# Patient Record
Sex: Female | Born: 1950 | Race: Black or African American | Hispanic: No | Marital: Single | Smoking: Never smoker
Health system: Southern US, Community
[De-identification: ages and names within clinical notes are randomized; demographics above are authoritative.]

## PROBLEM LIST (undated history)

## (undated) DIAGNOSIS — E785 Hyperlipidemia, unspecified: Secondary | ICD-10-CM

## (undated) DIAGNOSIS — I2510 Atherosclerotic heart disease of native coronary artery without angina pectoris: Secondary | ICD-10-CM

## (undated) DIAGNOSIS — Z972 Presence of dental prosthetic device (complete) (partial): Secondary | ICD-10-CM

## (undated) DIAGNOSIS — K219 Gastro-esophageal reflux disease without esophagitis: Secondary | ICD-10-CM

## (undated) DIAGNOSIS — K08109 Complete loss of teeth, unspecified cause, unspecified class: Secondary | ICD-10-CM

## (undated) DIAGNOSIS — I209 Angina pectoris, unspecified: Secondary | ICD-10-CM

## (undated) DIAGNOSIS — F419 Anxiety disorder, unspecified: Secondary | ICD-10-CM

## (undated) DIAGNOSIS — F329 Major depressive disorder, single episode, unspecified: Secondary | ICD-10-CM

## (undated) DIAGNOSIS — F32A Depression, unspecified: Secondary | ICD-10-CM

## (undated) DIAGNOSIS — I10 Essential (primary) hypertension: Secondary | ICD-10-CM

## (undated) HISTORY — DX: Essential (primary) hypertension: I10

## (undated) HISTORY — DX: Depression, unspecified: F32.A

## (undated) HISTORY — PX: TOOTH EXTRACTION: SUR596

## (undated) HISTORY — PX: BREAST SURGERY: SHX581

## (undated) HISTORY — DX: Major depressive disorder, single episode, unspecified: F32.9

## (undated) HISTORY — PX: TUBAL LIGATION: SHX77

## (undated) HISTORY — PX: COLONOSCOPY: SHX5424

## (undated) HISTORY — DX: Anxiety disorder, unspecified: F41.9

## (undated) HISTORY — PX: BREAST EXCISIONAL BIOPSY: SUR124

## (undated) HISTORY — DX: Hyperlipidemia, unspecified: E78.5

## (undated) HISTORY — DX: Gastro-esophageal reflux disease without esophagitis: K21.9

## (undated) HISTORY — PX: TOE OSTEOTOMY: SHX1071

---

## 2000-01-21 ENCOUNTER — Encounter: Admission: RE | Admit: 2000-01-21 | Discharge: 2000-01-21 | Payer: Self-pay | Admitting: Family Medicine

## 2000-01-21 ENCOUNTER — Encounter: Payer: Self-pay | Admitting: Family Medicine

## 2000-02-10 ENCOUNTER — Emergency Department (HOSPITAL_COMMUNITY): Admission: EM | Admit: 2000-02-10 | Discharge: 2000-02-10 | Payer: Self-pay | Admitting: Emergency Medicine

## 2000-02-10 ENCOUNTER — Encounter: Payer: Self-pay | Admitting: Emergency Medicine

## 2001-01-26 ENCOUNTER — Encounter: Admission: RE | Admit: 2001-01-26 | Discharge: 2001-01-26 | Payer: Self-pay | Admitting: Family Medicine

## 2001-01-26 ENCOUNTER — Encounter: Payer: Self-pay | Admitting: Family Medicine

## 2002-01-28 ENCOUNTER — Other Ambulatory Visit: Admission: RE | Admit: 2002-01-28 | Discharge: 2002-01-28 | Payer: Self-pay | Admitting: Internal Medicine

## 2002-05-24 ENCOUNTER — Encounter: Admission: RE | Admit: 2002-05-24 | Discharge: 2002-05-24 | Payer: Self-pay | Admitting: Family Medicine

## 2002-05-24 ENCOUNTER — Encounter: Payer: Self-pay | Admitting: Family Medicine

## 2003-12-20 ENCOUNTER — Encounter: Admission: RE | Admit: 2003-12-20 | Discharge: 2003-12-20 | Payer: Self-pay | Admitting: Family Medicine

## 2006-07-01 ENCOUNTER — Other Ambulatory Visit: Admission: RE | Admit: 2006-07-01 | Discharge: 2006-07-01 | Payer: Self-pay | Admitting: Family Medicine

## 2006-07-10 ENCOUNTER — Encounter: Admission: RE | Admit: 2006-07-10 | Discharge: 2006-07-10 | Payer: Self-pay | Admitting: Family Medicine

## 2006-09-09 ENCOUNTER — Encounter: Admission: RE | Admit: 2006-09-09 | Discharge: 2006-09-09 | Payer: Self-pay | Admitting: Family Medicine

## 2007-12-20 ENCOUNTER — Emergency Department (HOSPITAL_COMMUNITY): Admission: EM | Admit: 2007-12-20 | Discharge: 2007-12-21 | Payer: Self-pay | Admitting: Emergency Medicine

## 2009-08-30 ENCOUNTER — Observation Stay (HOSPITAL_COMMUNITY): Admission: EM | Admit: 2009-08-30 | Discharge: 2009-08-31 | Payer: Self-pay | Admitting: Emergency Medicine

## 2010-11-09 LAB — CARDIAC PANEL(CRET KIN+CKTOT+MB+TROPI)
CK, MB: 1.9 ng/mL (ref 0.3–4.0)
CK, MB: 2.1 ng/mL (ref 0.3–4.0)
Relative Index: 0.6 (ref 0.0–2.5)
Relative Index: 0.9 (ref 0.0–2.5)
Total CK: 205 U/L — ABNORMAL HIGH (ref 7–177)
Troponin I: 0.01 ng/mL (ref 0.00–0.06)
Troponin I: 0.01 ng/mL (ref 0.00–0.06)
Troponin I: 0.02 ng/mL (ref 0.00–0.06)

## 2010-11-09 LAB — URINALYSIS, ROUTINE W REFLEX MICROSCOPIC
Glucose, UA: NEGATIVE mg/dL
Hgb urine dipstick: NEGATIVE
Ketones, ur: NEGATIVE mg/dL
Nitrite: NEGATIVE
Protein, ur: NEGATIVE mg/dL
Urobilinogen, UA: 0.2 mg/dL (ref 0.0–1.0)
pH: 8 (ref 5.0–8.0)

## 2010-11-09 LAB — POCT CARDIAC MARKERS
CKMB, poc: 1.8 ng/mL (ref 1.0–8.0)
Myoglobin, poc: 85.9 ng/mL (ref 12–200)
Troponin i, poc: <0.05 ng/mL (ref 0.00–0.09)

## 2010-11-09 LAB — URINE DRUGS OF ABUSE SCREEN W ALC, ROUTINE (REF LAB)
Amphetamine Screen, Ur: NEGATIVE
Benzodiazepines.: NEGATIVE
Creatinine,U: 233.9 mg/dL
Ethyl Alcohol: <10 mg/dL (ref <10)
Opiate Screen, Urine: POSITIVE — AB

## 2010-11-09 LAB — CBC
Hemoglobin: 12.2 g/dL (ref 12.0–15.0)
MCHC: 35.5 g/dL (ref 30.0–36.0)
MCHC: 35.6 g/dL (ref 30.0–36.0)
MCV: 93.7 fL (ref 78.0–100.0)
Platelets: 263 10*3/uL (ref 150–400)
RDW: 12.8 % (ref 11.5–15.5)
WBC: 10.3 10*3/uL (ref 4.0–10.5)

## 2010-11-09 LAB — COMPREHENSIVE METABOLIC PANEL
Alkaline Phosphatase: 60 U/L (ref 39–117)
Chloride: 102 mEq/L (ref 96–112)
GFR calc Af Amer: >60 mL/min (ref >60)
Potassium: 3.8 mEq/L (ref 3.5–5.1)
Total Bilirubin: 0.7 mg/dL (ref 0.3–1.2)
Total Protein: 7 g/dL (ref 6.0–8.3)

## 2010-11-09 LAB — DIFFERENTIAL
Basophils Absolute: 0.1 10*3/uL (ref 0.0–0.1)
Basophils Relative: 1 % (ref 0–1)
Eosinophils Absolute: 0.1 10*3/uL (ref 0.0–0.7)
Lymphocytes Relative: 6 % — ABNORMAL LOW (ref 12–46)
Neutrophils Relative %: 84 % — ABNORMAL HIGH (ref 43–77)

## 2010-11-09 LAB — MAGNESIUM: Magnesium: 1.8 mg/dL (ref 1.5–2.5)

## 2010-11-09 LAB — POCT I-STAT, CHEM 8
Chloride: 105 mEq/L (ref 96–112)
Creatinine, Ser: 1 mg/dL (ref 0.4–1.2)
Hemoglobin: 12.2 g/dL (ref 12.0–15.0)
TCO2: 25 mmol/L (ref 0–100)

## 2010-11-09 LAB — OPIATE, QUANTITATIVE, URINE
Hydrocodone: 3922 NG/ML — ABNORMAL HIGH
Oxycodone, ur: NEGATIVE NG/ML
Oxymorphone: NEGATIVE NG/ML

## 2010-11-09 LAB — TSH: TSH: 0.545 u[IU]/mL (ref 0.350–4.500)

## 2010-11-09 LAB — LIPID PANEL
HDL: 76 mg/dL (ref >39)
LDL Cholesterol: 110 mg/dL — ABNORMAL HIGH (ref 0–99)
Triglycerides: 24 mg/dL (ref <150)

## 2013-04-27 DIAGNOSIS — L600 Ingrowing nail: Secondary | ICD-10-CM | POA: Insufficient documentation

## 2013-05-10 ENCOUNTER — Encounter: Payer: Self-pay | Admitting: *Deleted

## 2013-05-10 DIAGNOSIS — L600 Ingrowing nail: Secondary | ICD-10-CM

## 2013-05-25 ENCOUNTER — Ambulatory Visit: Payer: Self-pay | Admitting: Podiatry

## 2013-06-15 ENCOUNTER — Encounter: Payer: Self-pay | Admitting: Podiatry

## 2013-06-15 ENCOUNTER — Ambulatory Visit (INDEPENDENT_AMBULATORY_CARE_PROVIDER_SITE_OTHER): Payer: BC Managed Care – PPO | Admitting: Podiatry

## 2013-06-15 VITALS — BP 155/82 | HR 58 | Resp 16 | Ht 60.0 in | Wt 196.0 lb

## 2013-06-15 DIAGNOSIS — M2020 Hallux rigidus, unspecified foot: Secondary | ICD-10-CM

## 2013-06-15 DIAGNOSIS — M2021 Hallux rigidus, right foot: Secondary | ICD-10-CM

## 2013-06-15 DIAGNOSIS — L600 Ingrowing nail: Secondary | ICD-10-CM

## 2013-06-15 NOTE — Progress Notes (Signed)
Subjective:     Patient ID: Tonya Bryant, female   DOB: October 22, 1950, 62 y.o.   MRN: [id number]  HPI patient states my nails are scabbed over and I also had questions concerning my bunions   Review of Systems  All other systems reviewed and are negative.       Objective:   Physical Exam  Nursing note and vitals reviewed. Cardiovascular: Intact distal pulses.   Musculoskeletal: Normal range of motion.  Neurological: She is alert.  Skin: Skin is warm.   patient's hallux nails bilateral the medial side there crusted over no drainage noted. Right first MPJ is mildly enlarged with limitation of motion of the joint     Assessment:     Improving ingrown toenail surgery bilateral normal recovery. 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 He should want surgery but wants to hold off continue Band-Aids at the current time

## 2013-06-21 ENCOUNTER — Other Ambulatory Visit: Payer: Self-pay | Admitting: Orthopedic Surgery

## 2013-07-05 ENCOUNTER — Encounter (HOSPITAL_BASED_OUTPATIENT_CLINIC_OR_DEPARTMENT_OTHER): Payer: Self-pay | Admitting: *Deleted

## 2013-07-05 NOTE — Progress Notes (Signed)
Said she had labs and ekg-will call-took one mor ehtn med-to bring [employer]

## 2013-07-06 NOTE — H&P (Signed)
Tonya Bryant is an 62 y.o. female.   Chief Complaint:  c/o chronic and progressive numbness and tingling of the left hand and STS symptoms left thumb HPI: Tonya Bryant presented for evaluation of new symptoms of triggering of her left thumb, tenderness at her left first dorsal compartment, and bilateral hand numbness, left worse than right. I have known Ms. Bryant for many years. She has had prior trigger fingers. She reports that she has had nocturnal numbness and morning numbness in her left hand for months.   Past Medical History  Diagnosis Date  . Hypertension   . Anxiety   . Depression (emotion)   . Full dentures     Past Surgical History  Procedure Laterality Date  . Breast surgery      lt br bx-neg  . Toe osteotomy      rt big toe  . Tubal ligation    . Colonoscopy    . Tooth extraction      Family History  Problem Relation Age of Onset  . Cancer Mother   . Diabetes Mother   . Stroke Mother   . Hypertension Mother   . Stroke Father   . Diabetes Father   . Hypertension Father   . Gout Father    Social History:  reports that she has never smoked. She does not have any smokeless tobacco history on file. She reports that she drinks about 0.5 ounces of alcohol per week. She reports that she does not use illicit drugs.  Allergies: No Known Allergies  No prescriptions prior to admission    No results found for this or any previous visit (from the past 48 hour(s)).  No results found.   Pertinent items are noted in HPI.  There were no vitals taken for this visit.  General appearance: alert Head: Normocephalic, without obvious abnormality Neck: supple, symmetrical, trachea midline Resp: clear to auscultation bilaterally Cardio: regular rate and rhythm GI: normal findings: bowel sounds normal Extremities:  She has active stenosing tenosynovitis of her left thumb at the A-1 pulley, tenderness and locking of the IP joint in flexion. She has tenderness over the  left first dorsal compartment and a positive Finkelstein's maneuver. She has diminished sensibility in the median distribution bilaterally. There is no sign of thenar atrophy. She has full ROM of her fingers on the left, fingers on the right, and thumb on the right. She has no sign of right first dorsal compartment stenosing tenosynovitis.   X-rays of her cervical spine were obtained due to complaints of stiffness and referred pain to the periscapular region. Ms. Bryant is noted to have significant degenerative disc disease at C4/5, C5/6, and C6/7 with loss of her normal cervical lordosis. She has some posterior projecting osteophytes.  We asked Dr. Johna Roles to perform screening electrodiagnostic studies. These revealed profound left carpal tunnel syndrome and moderate right carpal tunnel syndrome. Her ulnar nerve conduction parameters were normal.   Pulses: 2+ and symmetric Skin: mobility and turgor normal Neurologic: Grossly normal    Assessment/Plan Impression: Left CTS and STS left thumb.  Plan: To the OR for left CTR and release left thumb A-1 pulley.The procedure, risks,benefits and post-op course were discussed with the patient at length and they were in agreement with the plan. DASNOIT,Audiel Scheiber J  07/06/2013, 4:18 PM  H&P documentation: 07/07/2013  -History and Physical Reviewed  -Patient has been re-examined  -No change in the plan of care  Wyn Forster, MD

## 2013-07-07 ENCOUNTER — Encounter (HOSPITAL_BASED_OUTPATIENT_CLINIC_OR_DEPARTMENT_OTHER): Payer: Self-pay | Admitting: *Deleted

## 2013-07-07 ENCOUNTER — Ambulatory Visit (HOSPITAL_BASED_OUTPATIENT_CLINIC_OR_DEPARTMENT_OTHER): Payer: BC Managed Care – PPO | Admitting: Anesthesiology

## 2013-07-07 ENCOUNTER — Encounter (HOSPITAL_BASED_OUTPATIENT_CLINIC_OR_DEPARTMENT_OTHER): Payer: BC Managed Care – PPO | Admitting: Anesthesiology

## 2013-07-07 ENCOUNTER — Ambulatory Visit (HOSPITAL_BASED_OUTPATIENT_CLINIC_OR_DEPARTMENT_OTHER)
Admission: RE | Admit: 2013-07-07 | Discharge: 2013-07-07 | Disposition: A | Discharge status: Home / Self Care | Payer: BC Managed Care – PPO | Source: Ambulatory Visit | Attending: Orthopedic Surgery | Admitting: Orthopedic Surgery

## 2013-07-07 ENCOUNTER — Encounter (HOSPITAL_BASED_OUTPATIENT_CLINIC_OR_DEPARTMENT_OTHER)
Admission: RE | Disposition: A | Discharge status: Home / Self Care | Payer: Self-pay | Source: Ambulatory Visit | Attending: Orthopedic Surgery

## 2013-07-07 DIAGNOSIS — F3289 Other specified depressive episodes: Secondary | ICD-10-CM | POA: Insufficient documentation

## 2013-07-07 DIAGNOSIS — M5030 Other cervical disc degeneration, unspecified cervical region: Secondary | ICD-10-CM | POA: Insufficient documentation

## 2013-07-07 DIAGNOSIS — F411 Generalized anxiety disorder: Secondary | ICD-10-CM | POA: Insufficient documentation

## 2013-07-07 DIAGNOSIS — M65839 Other synovitis and tenosynovitis, unspecified forearm: Secondary | ICD-10-CM | POA: Insufficient documentation

## 2013-07-07 DIAGNOSIS — F329 Major depressive disorder, single episode, unspecified: Secondary | ICD-10-CM | POA: Insufficient documentation

## 2013-07-07 DIAGNOSIS — I10 Essential (primary) hypertension: Secondary | ICD-10-CM | POA: Insufficient documentation

## 2013-07-07 DIAGNOSIS — M6530 Trigger finger, unspecified finger: Secondary | ICD-10-CM | POA: Insufficient documentation

## 2013-07-07 DIAGNOSIS — G5600 Carpal tunnel syndrome, unspecified upper limb: Secondary | ICD-10-CM | POA: Insufficient documentation

## 2013-07-07 HISTORY — PX: CARPAL TUNNEL RELEASE: SHX101

## 2013-07-07 HISTORY — PX: TRIGGER FINGER RELEASE: SHX641

## 2013-07-07 HISTORY — DX: Complete loss of teeth, unspecified cause, unspecified class: Z97.2

## 2013-07-07 HISTORY — DX: Complete loss of teeth, unspecified cause, unspecified class: K08.109

## 2013-07-07 LAB — POCT HEMOGLOBIN-HEMACUE: Hemoglobin: 13 g/dL (ref 12.0–15.0)

## 2013-07-07 SURGERY — CARPAL TUNNEL RELEASE
Anesthesia: General | Site: Wrist | Laterality: Left | Wound class: Clean

## 2013-07-07 MED ORDER — FENTANYL CITRATE 0.05 MG/ML IJ SOLN: 50.0000 ug | INTRAMUSCULAR | Status: DC | PRN

## 2013-07-07 MED ORDER — FENTANYL CITRATE 0.05 MG/ML IJ SOLN
INTRAMUSCULAR | Status: DC | PRN
  Administered 2013-07-07: 25 ug via INTRAVENOUS

## 2013-07-07 MED ORDER — ONDANSETRON HCL 4 MG/2ML IJ SOLN
INTRAMUSCULAR | Status: DC | PRN
  Administered 2013-07-07: 4 mg via INTRAVENOUS

## 2013-07-07 MED ORDER — OXYCODONE-ACETAMINOPHEN 5-325 MG PO TABS: ORAL_TABLET | ORAL | Status: DC

## 2013-07-07 MED ORDER — LIDOCAINE HCL (CARDIAC) 20 MG/ML IV SOLN
INTRAVENOUS | Status: DC | PRN
  Administered 2013-07-07: 100 mg via INTRAVENOUS

## 2013-07-07 MED ORDER — MIDAZOLAM HCL 2 MG/2ML IJ SOLN
INTRAMUSCULAR | Status: AC
  Filled 2013-07-07: qty 2

## 2013-07-07 MED ORDER — DEXAMETHASONE SODIUM PHOSPHATE 4 MG/ML IJ SOLN
INTRAMUSCULAR | Status: DC | PRN
  Administered 2013-07-07: 10 mg via INTRAVENOUS

## 2013-07-07 MED ORDER — HYDROMORPHONE HCL PF 1 MG/ML IJ SOLN: 0.2500 mg | INTRAMUSCULAR | Status: DC | PRN

## 2013-07-07 MED ORDER — LIDOCAINE HCL 2 % IJ SOLN
INTRAMUSCULAR | Status: DC | PRN
  Administered 2013-07-07: 3 mL

## 2013-07-07 MED ORDER — FENTANYL CITRATE 0.05 MG/ML IJ SOLN
INTRAMUSCULAR | Status: AC
  Filled 2013-07-07: qty 2

## 2013-07-07 MED ORDER — PROPOFOL 10 MG/ML IV BOLUS
INTRAVENOUS | Status: DC | PRN
  Administered 2013-07-07: 200 mg via INTRAVENOUS

## 2013-07-07 MED ORDER — LIDOCAINE HCL 2 % IJ SOLN
INTRAMUSCULAR | Status: AC
  Filled 2013-07-07: qty 20

## 2013-07-07 MED ORDER — MIDAZOLAM HCL 2 MG/2ML IJ SOLN: 1.0000 mg | INTRAMUSCULAR | Status: DC | PRN

## 2013-07-07 MED ORDER — OXYCODONE HCL 5 MG/5ML PO SOLN: 5.0000 mg | Freq: Once | ORAL | Status: DC | PRN

## 2013-07-07 MED ORDER — CHLORHEXIDINE GLUCONATE 4 % EX LIQD: 60.0000 mL | Freq: Once | CUTANEOUS | Status: DC

## 2013-07-07 MED ORDER — OXYCODONE HCL 5 MG PO TABS: 5.0000 mg | ORAL_TABLET | Freq: Once | ORAL | Status: DC | PRN

## 2013-07-07 MED ORDER — PROPOFOL 10 MG/ML IV EMUL
INTRAVENOUS | Status: AC
  Filled 2013-07-07: qty 50

## 2013-07-07 MED ORDER — METOCLOPRAMIDE HCL 5 MG/ML IJ SOLN: 10.0000 mg | Freq: Once | INTRAMUSCULAR | Status: DC | PRN

## 2013-07-07 MED ORDER — LACTATED RINGERS IV SOLN
INTRAVENOUS | Status: DC
  Administered 2013-07-07: 07:00:00 via INTRAVENOUS

## 2013-07-07 SURGICAL SUPPLY — 43 items
BANDAGE ADHESIVE 1X3 (GAUZE/BANDAGES/DRESSINGS) IMPLANT
BANDAGE COBAN STERILE 2 (GAUZE/BANDAGES/DRESSINGS) ×2 IMPLANT
BANDAGE ELASTIC 3 VELCRO ST LF (GAUZE/BANDAGES/DRESSINGS) ×2 IMPLANT
BLADE SURG 15 STRL LF DISP TIS (BLADE) ×2 IMPLANT
BLADE SURG 15 STRL SS (BLADE) ×3
BNDG CMPR 9X4 STRL LF SNTH (GAUZE/BANDAGES/DRESSINGS) ×2
BNDG COHESIVE 3X5 TAN STRL LF (GAUZE/BANDAGES/DRESSINGS) ×1 IMPLANT
BNDG ESMARK 4X9 LF (GAUZE/BANDAGES/DRESSINGS) ×1 IMPLANT
BRUSH SCRUB EZ PLAIN DRY (MISCELLANEOUS) ×3 IMPLANT
CORDS BIPOLAR (ELECTRODE) ×1 IMPLANT
COVER MAYO STAND STRL (DRAPES) ×3 IMPLANT
COVER TABLE BACK 60X90 (DRAPES) ×3 IMPLANT
CUFF TOURNIQUET SINGLE 18IN (TOURNIQUET CUFF) ×1 IMPLANT
DECANTER SPIKE VIAL GLASS SM (MISCELLANEOUS) IMPLANT
DRAPE EXTREMITY T 121X128X90 (DRAPE) ×3 IMPLANT
DRAPE SURG 17X23 STRL (DRAPES) ×3 IMPLANT
GLOVE BIOGEL M STRL SZ7.5 (GLOVE) ×3 IMPLANT
GLOVE BIOGEL PI IND STRL 7.0 (GLOVE) IMPLANT
GLOVE BIOGEL PI INDICATOR 7.0 (GLOVE) ×2
GLOVE ECLIPSE 6.5 STRL STRAW (GLOVE) ×1 IMPLANT
GLOVE ORTHO TXT STRL SZ7.5 (GLOVE) ×3 IMPLANT
GOWN BRE IMP PREV XXLGXLNG (GOWN DISPOSABLE) ×5 IMPLANT
GOWN PREVENTION PLUS XLARGE (GOWN DISPOSABLE) ×3 IMPLANT
NEEDLE 27GAX1X1/2 (NEEDLE) ×1 IMPLANT
PACK BASIN DAY SURGERY FS (CUSTOM PROCEDURE TRAY) ×3 IMPLANT
PAD CAST 3X4 CTTN HI CHSV (CAST SUPPLIES) ×2 IMPLANT
PADDING CAST ABS 4INX4YD NS (CAST SUPPLIES) ×1
PADDING CAST ABS COTTON 4X4 ST (CAST SUPPLIES) ×2 IMPLANT
PADDING CAST COTTON 3X4 STRL (CAST SUPPLIES) ×3
SPLINT PLASTER CAST XFAST 3X15 (CAST SUPPLIES) ×10 IMPLANT
SPLINT PLASTER XTRA FASTSET 3X (CAST SUPPLIES) ×5
SPONGE GAUZE 4X4 12PLY (GAUZE/BANDAGES/DRESSINGS) ×2 IMPLANT
STOCKINETTE 4X48 STRL (DRAPES) ×3 IMPLANT
STRIP CLOSURE SKIN 1/2X4 (GAUZE/BANDAGES/DRESSINGS) ×3 IMPLANT
SUT ETHILON 5 0 P 3 18 (SUTURE)
SUT NYLON ETHILON 5-0 P-3 1X18 (SUTURE) IMPLANT
SUT PROLENE 3 0 PS 2 (SUTURE) ×3 IMPLANT
SUT PROLENE 4 0 P 3 18 (SUTURE) ×1 IMPLANT
SYR 3ML 23GX1 SAFETY (SYRINGE) IMPLANT
SYR CONTROL 10ML LL (SYRINGE) ×1 IMPLANT
TOWEL OR 17X24 6PK STRL BLUE (TOWEL DISPOSABLE) ×3 IMPLANT
TRAY DSU PREP LF (CUSTOM PROCEDURE TRAY) ×3 IMPLANT
UNDERPAD 30X30 INCONTINENT (UNDERPADS AND DIAPERS) ×3 IMPLANT

## 2013-07-07 NOTE — Anesthesia Preprocedure Evaluation (Signed)
Anesthesia Evaluation  Patient identified by MRN, date of birth, ID band Patient awake    Reviewed: Allergy & Precautions, H&P , NPO status , Patient's Chart, lab work & pertinent test results, reviewed documented beta blocker date and time   Airway Mallampati: II TM Distance: >3 FB Neck ROM: full    Dental   Pulmonary neg pulmonary ROS,  breath sounds clear to auscultation        Cardiovascular hypertension, On Medications and On Home Beta Blockers Rhythm:regular     Neuro/Psych negative neurological ROS  negative psych ROS   GI/Hepatic negative GI ROS, Neg liver ROS,   Endo/Other  Morbid obesity  Renal/GU negative Renal ROS  negative genitourinary   Musculoskeletal   Abdominal   Peds  Hematology negative hematology ROS (+)   Anesthesia Other Findings See surgeon's H&P   Reproductive/Obstetrics negative OB ROS                           Anesthesia Physical Anesthesia Plan  ASA: III  Anesthesia Plan: General   Post-op Pain Management:    Induction: Intravenous  Airway Management Planned: LMA  Additional Equipment:   Intra-op Plan:   Post-operative Plan:   Informed Consent: I have reviewed the patients History and Physical, chart, labs and discussed the procedure including the risks, benefits and alternatives for the proposed anesthesia with the patient or authorized representative who has indicated his/her understanding and acceptance.   Dental Advisory Given  Plan Discussed with: CRNA and Surgeon  Anesthesia Plan Comments:         Anesthesia Quick Evaluation

## 2013-07-07 NOTE — Brief Op Note (Signed)
07/07/2013  8:35 AM  PATIENT:  Eleyna J [place]  62 y.o. female  PRE-OPERATIVE DIAGNOSIS:  SEVERE LEFT CARPAL TUNNEL SYNDROME, LEFT TRIGGER THUMB  POST-OPERATIVE DIAGNOSIS:  SEVERE LEFT CARPAL TUNNEL SYNDROME, LEFT TRIGGER THUMB  PROCEDURE:  Procedure(s): LEFT CARPAL TUNNEL RELEASE (Left) LEFT THUMB RELEASE A-1 PULLEY (Left)  SURGEON:  Surgeon(s) and Role:    * Wyn Forster., MD - Primary  PHYSICIAN ASSISTANT:   ASSISTANTS: surgical technician   ANESTHESIA:   general  EBL:  Total I/O In: 700 [I.V.:700] Out: -   BLOOD ADMINISTERED:none  DRAINS: none   LOCAL MEDICATIONS USED:  LIDOCAINE   SPECIMEN:  No Specimen  DISPOSITION OF SPECIMEN:  N/A  COUNTS:  YES  TOURNIQUET:   Total Tourniquet Time Documented: Upper Arm (Left) - 18 minutes Total: Upper Arm (Left) - 18 minutes   DICTATION: .Other Dictation: Dictation Number [id number]  PLAN OF CARE: Discharge to home after PACU  PATIENT DISPOSITION:  PACU - hemodynamically stable.   Delay start of Pharmacological VTE agent (>24hrs) due to surgical blood loss or risk of bleeding: not applicable

## 2013-07-07 NOTE — Anesthesia Procedure Notes (Signed)
Procedure Name: LMA Insertion Date/Time: 07/07/2013 7:51 AM Performed by: Gar Gibbon Pre-anesthesia Checklist: Patient identified, Emergency Drugs available, Suction available and Patient being monitored Patient Re-evaluated:Patient Re-evaluated prior to inductionOxygen Delivery Method: Circle System Utilized Preoxygenation: Pre-oxygenation with 100% oxygen Intubation Type: IV induction Ventilation: Mask ventilation without difficulty LMA: LMA with gastric port inserted LMA Size: 4.0 Number of attempts: 1 Placement Confirmation: positive ETCO2 Tube secured with: Tape Dental Injury: Teeth and Oropharynx as per pre-operative assessment

## 2013-07-07 NOTE — Op Note (Signed)
[id number]  

## 2013-07-07 NOTE — Anesthesia Postprocedure Evaluation (Signed)
Anesthesia Post Note  Patient: Tonya Bryant  Procedure(s) Performed: Procedure(s) (LRB): LEFT CARPAL TUNNEL RELEASE (Left) LEFT THUMB RELEASE A-1 PULLEY (Left)  Anesthesia type: General  Patient location: PACU  Post pain: Pain level controlled  Post assessment: Patient's Cardiovascular Status Stable  Last Vitals:  Filed Vitals:   07/07/13 0845  BP: 175/84  Pulse: 56  Temp:   Resp: 17    Post vital signs: Reviewed and stable  Level of consciousness: alert  Complications: No apparent anesthesia complications

## 2013-07-07 NOTE — Transfer of Care (Signed)
Immediate Anesthesia Transfer of Care Note  Patient: Tonya Bryant  Procedure(s) Performed: Procedure(s): LEFT CARPAL TUNNEL RELEASE (Left) LEFT THUMB RELEASE A-1 PULLEY (Left)  Patient Location: PACU  Anesthesia Type:General  Level of Consciousness: awake, oriented and patient cooperative  Airway & Oxygen Therapy: Patient Spontanous Breathing and Patient connected to face mask oxygen  Post-op Assessment: Report given to PACU RN and Post -op Vital signs reviewed and stable  Post vital signs: Reviewed and stable  Complications: No apparent anesthesia complications

## 2013-07-08 ENCOUNTER — Encounter (HOSPITAL_BASED_OUTPATIENT_CLINIC_OR_DEPARTMENT_OTHER): Payer: Self-pay | Admitting: Orthopedic Surgery

## 2013-07-08 NOTE — Op Note (Signed)
NAME:  Tonya Bryant, Tonya Bryant               ACCOUNT NO.:  [id number]  MEDICAL RECORD NO.:  [id number]  LOCATION:  GS                           FACILITY:  [facility]  PHYSICIAN:  Katy Fitch. Teague Goynes, M.D. DATE OF BIRTH:  1950/11/26  DATE OF PROCEDURE:  07/07/2013 DATE OF DISCHARGE:  06/15/2013                              OPERATIVE REPORT   PREOPERATIVE DIAGNOSES: 1. 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 2. 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  POSTOPERATIVE DIAGNOSES: 1. 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 2. 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  OPERATIONS: 1. Release of left transverse carpal ligament. 2. Release of left thumb A1 pulley through separate incision.  OPERATING SURGEON:  Katy Fitch. Christin Mccreedy, M.D.  ASSISTANT:  Surgical technician.  ANESTHESIA:  General by LMA.  SUPERVISING ANESTHESIOLOGIST:  Janetta Hora. Gelene Mink, M.D.  INDICATIONS:  Tonya Bryant is a 62 year old woman referred through the courtesy of Dr. Nilda Simmer.  We have followed her for a number of predicaments over the years.  She has had stenosing tenosynovitis and carpal tunnel symptoms.  She has had unresolved median entrapment neuropathy symptoms on the left and triggering of her left thumb.  She has not responded to nonoperative measures.  Therefore, she is brought to the operating room at this time for release of her left transverse carpal ligament and release of left thumb A1 pulley.  After informed consent, she was brought to the operating room at this time.  DESCRIPTION OF PROCEDURE:  Tonya Bryant was brought to room 1 of the [facility] and placed in supine position up on the operating table.  Following induction of general anesthesia by LMA technique, the left arm and hand were prepped with Betadine soap and solution, and sterilely draped.  A pneumatic tourniquet was applied to the proximal left  brachium. Following routine Betadine scrub and paint, sterile stockinette and impervious arthroscopy drapes were applied, followed by routine surgical time-out.  The left hand and arm were then exsanguinated with an Esmarch bandage and arterial tourniquet inflated to 220 mmHg.  Procedure commenced with a short transverse incision directly over the palpably thickened left thumb A1 pulley.  Subcutaneous tissues were carefully divided, taking care to gently retract the radial proper digital nerve and artery.  The pulley was isolated by a Freer dissection and 3 Ragnell retractors placed.  The A1 pulley was split with scalpel and scissors. The flexor tendon was delivered.  Thereafter, free range of motion of the IP joint was recovered.  The wound was repaired with intradermal 4-0 Prolene and Steri-Strips. Attention was then directed to the proximal palm.  A short incision was fashioned in the line of the ring finger.  Subcutaneous tissues were carefully divided releasing the palmar fascia.  This split longitudinally to the common sensory branch of the median nerve and superficial palmar arch.  The distal margin of the transverse carpal ligament was isolated and Penfield 4 elevator passed through the canal. The transverse carpal ligament was released along its ulnar border extending into the distal forearm.  The volar forearm fascia was released 4 cm above the distal wrist flexion crease.  A parotid  retractor was used to visualize the distal forearm.  There were no mass or predicaments noted.  Bleeding points were not problematic.  The wound was repaired with intradermal 3-0 Prolene and Steri-Strips.  A 2% lidocaine was infiltrated along both wound margins for postoperative analgesia.  Tonya Bryant was placed in compressive dressing with sterile gauze, sterile Webril, and a volar plaster splint.  For aftercare, she is provided a prescription for oxycodone 5 mg 1 p.o. q.4-6 hours p.r.n.  pain, 20 tabs without refill.     Katy Fitch Jareb Radoncic, M.D.     RVS/MEDQ  D:  07/07/2013  T:  07/08/2013  Job:  [id number]

## 2014-02-09 ENCOUNTER — Encounter (HOSPITAL_COMMUNITY): Payer: BC Managed Care – PPO | Attending: Cardiology | Admitting: Radiology

## 2014-02-09 VITALS — BP 170/86 | Ht 60.0 in | Wt 198.0 lb

## 2014-02-09 DIAGNOSIS — I209 Angina pectoris, unspecified: Secondary | ICD-10-CM

## 2014-02-09 DIAGNOSIS — R079 Chest pain, unspecified: Secondary | ICD-10-CM | POA: Insufficient documentation

## 2014-02-09 DIAGNOSIS — R0602 Shortness of breath: Secondary | ICD-10-CM

## 2014-02-09 MED ORDER — REGADENOSON 0.4 MG/5ML IV SOLN
0.4000 mg | Freq: Once | INTRAVENOUS | Status: AC
  Administered 2014-02-09: 0.4 mg via INTRAVENOUS

## 2014-02-09 MED ORDER — TECHNETIUM TC 99M SESTAMIBI GENERIC - CARDIOLITE
10.0000 | Freq: Once | INTRAVENOUS | Status: AC | PRN
  Administered 2014-02-09: 10 via INTRAVENOUS

## 2014-02-09 MED ORDER — TECHNETIUM TC 99M SESTAMIBI GENERIC - CARDIOLITE
30.0000 | Freq: Once | INTRAVENOUS | Status: AC | PRN
  Administered 2014-02-09: 30 via INTRAVENOUS

## 2014-02-09 NOTE — Progress Notes (Signed)
[facility] SITE 3 NUCLEAR MED [address] [place], [place] [address] [phone]    Cardiology Nuclear Med Study  Tonya Bryant is a 63 y.o. female     MRN : [id number]     DOB: 1951-08-21  Procedure Date: 02/09/2014  Nuclear Med Background Indication for Stress Test:  Evaluation for Ischemia History:  No Cardiac Symptoms Cardiac Risk Factors: Family History - CAD and Hypertension  Symptoms:  Chest Pain, Dizziness and SOB   Nuclear Pre-Procedure Caffeine/Decaff Intake:  None NPO After: 11 am   Lungs:  clear O2 Sat: 99% on room air. IV 0.9% NS with Angio Cath:  22g  IV Site: R Hand  IV Started by:  Bonnita LevanJackie Smith, RN  Chest Size (in):  40 Cup Size: D  Height: 5' (1.524 m)  Weight:  198 lb (89.812 kg)  BMI:  Body mass index is 38.67 kg/(m^2). Tech Comments:  Took Ziac this AM; Pt switched to low-level lexiscan at the 7 min mark of exercise stress due to pt unable to get heart rate up and severe sob /fatigue.    Nuclear Med Study 1 or 2 day study: 1 day  Stress Test Type:  Stress  Reading MD: N/A  Order Authorizing Provider:  Blair Heysobert Ehinger, MD  Resting Radionuclide: Technetium 4564m Sestamibi  Resting Radionuclide Dose: 11.0 mCi   Stress Radionuclide:  Technetium 6764m Sestamibi  Stress Radionuclide Dose: 33.0 mCi           Stress Protocol Rest HR: 46 Stress HR: 121  Rest BP: 170/86 Stress BP: 217/93  Exercise Time (min): 7:50 METS: 9:30   Predicted Max HR: 159 bpm % Max HR: 76.1 bpm Rate Pressure Product: [id number]   Dose of Adenosine (mg):  n/a Dose of Lexiscan: 0.4 mg  Dose of Atropine (mg): n/a Dose of Dobutamine: n/a mcg/kg/min (at max HR)  Stress Test Technologist: Frederick Peerseresa Johnson, EMT-P  Nuclear Technologist:  Doyne Keelonya Yount, CNMT     Rest Procedure:  Myocardial perfusion imaging was performed at rest 45 minutes following the intravenous administration of Technetium 3464m Sestamibi. Rest ECG: Sinus bradycardia. Normal QRS.  Stress Procedure:  The patient  received IV Lexiscan 0.4 mg over 15-seconds with concurrent low level exercise and then Technetium 3364m Sestamibi was injected at 30-seconds while the patient continued walking one more minute.  Quantitative spect images were obtained after a 45-minute delay. Stress ECG: No significant change from baseline ECG  QPS Raw Data Images:  Normal; no motion artifact; normal heart/lung ratio. Stress Images:  Question very slight decreased activity in the mid anterior wall. This appears to be insignificant. Rest Images:  Normal homogeneous uptake in all areas of the myocardium. Subtraction (SDS):  No evidence of ischemia. Transient Ischemic Dilatation (Normal <1.22):  0.92 Lung/Heart Ratio (Normal <0.45):  0.24  Quantitative Gated Spect Images QGS EDV:  83 ml QGS ESV:  26 ml  Impression Exercise Capacity:  Lexiscan with low level exercise. BP Response:  Normal blood pressure response. Clinical Symptoms:  Patient has severe shortness of breath and fatigue when walking on the treadmill. The heart rate did not increase. The study was changed to low-level stress with Lexiscan ECG Impression:  No significant ST segment change suggestive of ischemia. Comparison with Prior Nuclear Study: No images to compare  Overall Impression:  This is a low risk scan. There is no significant abnormality. There is no scar or ischemia.  LV Ejection Fraction: 68%.  LV Wall Motion:  Normal Wall Motion.  Dola Argyle, MD

## 2014-08-04 ENCOUNTER — Other Ambulatory Visit: Payer: Self-pay | Admitting: Family Medicine

## 2014-08-04 ENCOUNTER — Ambulatory Visit: Payer: BC Managed Care – PPO

## 2014-08-04 ENCOUNTER — Other Ambulatory Visit (HOSPITAL_COMMUNITY)
Admission: RE | Admit: 2014-08-04 | Discharge: 2014-08-04 | Disposition: A | Discharge status: Home / Self Care | Payer: BC Managed Care – PPO | Source: Ambulatory Visit | Attending: Family Medicine | Admitting: Family Medicine

## 2014-08-04 DIAGNOSIS — Z124 Encounter for screening for malignant neoplasm of cervix: Secondary | ICD-10-CM | POA: Insufficient documentation

## 2014-08-04 DIAGNOSIS — Z1231 Encounter for screening mammogram for malignant neoplasm of breast: Secondary | ICD-10-CM

## 2014-08-08 LAB — CYTOLOGY - PAP

## 2014-08-24 ENCOUNTER — Ambulatory Visit
Admission: RE | Admit: 2014-08-24 | Discharge: 2014-08-24 | Disposition: A | Discharge status: Home / Self Care | Payer: BC Managed Care – PPO | Source: Ambulatory Visit | Attending: Family Medicine | Admitting: Family Medicine

## 2014-08-24 DIAGNOSIS — Z1231 Encounter for screening mammogram for malignant neoplasm of breast: Secondary | ICD-10-CM

## 2015-04-24 ENCOUNTER — Encounter: Payer: Self-pay | Admitting: Podiatry

## 2015-04-24 ENCOUNTER — Ambulatory Visit (INDEPENDENT_AMBULATORY_CARE_PROVIDER_SITE_OTHER): Payer: No Typology Code available for payment source | Admitting: Podiatry

## 2015-04-24 ENCOUNTER — Ambulatory Visit (INDEPENDENT_AMBULATORY_CARE_PROVIDER_SITE_OTHER): Payer: No Typology Code available for payment source

## 2015-04-24 VITALS — BP 154/70 | HR 52 | Resp 16

## 2015-04-24 DIAGNOSIS — M79671 Pain in right foot: Secondary | ICD-10-CM

## 2015-04-24 DIAGNOSIS — M779 Enthesopathy, unspecified: Secondary | ICD-10-CM | POA: Diagnosis not present

## 2015-04-24 DIAGNOSIS — M79672 Pain in left foot: Secondary | ICD-10-CM

## 2015-04-24 DIAGNOSIS — S93402A Sprain of unspecified ligament of left ankle, initial encounter: Secondary | ICD-10-CM

## 2015-04-24 MED ORDER — TRIAMCINOLONE ACETONIDE 10 MG/ML IJ SUSP
10.0000 mg | Freq: Once | INTRAMUSCULAR | Status: AC
  Administered 2015-04-24: 10 mg

## 2015-04-24 MED ORDER — DICLOFENAC SODIUM 75 MG PO TBEC: 75.0000 mg | DELAYED_RELEASE_TABLET | Freq: Two times a day (BID) | ORAL | Status: DC

## 2015-04-24 NOTE — Progress Notes (Signed)
Subjective:     Patient ID: Tonya Bryant, female   DOB: 02/11/1951, 64 y.o.   MRN: [id number]  HPI patient states I'm getting a lot of pain on top of my left foot and it's inflamed and also on the side of my left over right foot. Both of them bother me that the left is worse and I don't remember specific injury even though I do think I sprained my left ankle   Review of Systems  All other systems reviewed and are negative.      Objective:   Physical Exam  Constitutional: She is oriented to person, place, and time.  Cardiovascular: Intact distal pulses.   Musculoskeletal: Normal range of motion.  Neurological: She is oriented to person, place, and time.  Skin: Skin is warm.  Nursing note and vitals reviewed.  neurovascular status found to be intact with muscle strength adequate range of motion within normal limits. Patient's noted to have quite a bit of discomfort in the dorsum of the left foot with fluid buildup around the midtarsal joint and into the extensor tendon complex and edema in the left lateral ankle with no indication currently of ligamentous instability     Assessment:     Appears to be tendinitis of the left dorsal foot with sprained left ankle which was probably separate incident    Plan:     H&P and x-ray reviewed. At this point I did a careful dorsal injection of the left ankle and tendon complex 3 mg Kenalog 5 mg Xylocaine and then advised on a strap to support the left ankle and reduce pressure with fascial type brace. Instructed on ice of the lateral ankle and heat and ice to the dorsum foot and reappoint to recheck and also reviewed right foot

## 2015-05-09 ENCOUNTER — Ambulatory Visit: Payer: No Typology Code available for payment source | Admitting: Podiatry

## 2015-05-14 ENCOUNTER — Ambulatory Visit (INDEPENDENT_AMBULATORY_CARE_PROVIDER_SITE_OTHER): Payer: No Typology Code available for payment source | Admitting: Podiatry

## 2015-05-14 ENCOUNTER — Encounter: Payer: Self-pay | Admitting: Podiatry

## 2015-05-14 VITALS — BP 166/85 | HR 53 | Resp 16

## 2015-05-14 DIAGNOSIS — M779 Enthesopathy, unspecified: Secondary | ICD-10-CM | POA: Diagnosis not present

## 2015-05-14 DIAGNOSIS — S93402A Sprain of unspecified ligament of left ankle, initial encounter: Secondary | ICD-10-CM | POA: Diagnosis not present

## 2015-05-14 NOTE — Progress Notes (Signed)
Subjective:     Patient ID: Tonya Bryant, female   DOB: Sep 21, 1950, 64 y.o.   MRN: [id number]  HPI patient states the top of my left foot feels quite a bit better but I'm still getting discomfort in my sprained ankle can still bother me and feel that it's unstable at times   Review of Systems     Objective:   Physical Exam Neurovascular status intact muscle strength adequate with tendinitis dorsal left still present but improved and mild discomfort in the outside left ankle. Dorsum of the right foot also is she was complaining about but I did not note significant inflammation    Assessment:     Tendinitis bilateral with sprained ankle left lateral side    Plan:     Reviewed condition and recommended anti-inflammatory's heat and ice therapy wider shoe gear and continued ankle brace usage. This should gradually get better and patient will reappoint as needed

## 2015-10-31 ENCOUNTER — Emergency Department (HOSPITAL_COMMUNITY): Payer: BLUE CROSS/BLUE SHIELD

## 2015-10-31 ENCOUNTER — Encounter (HOSPITAL_COMMUNITY): Payer: Self-pay | Admitting: Vascular Surgery

## 2015-10-31 ENCOUNTER — Inpatient Hospital Stay (HOSPITAL_COMMUNITY)
Admission: EM | Admit: 2015-10-31 | Discharge: 2015-11-02 | DRG: 247 | Disposition: A | Discharge status: Home / Self Care | Payer: BLUE CROSS/BLUE SHIELD | Attending: Internal Medicine | Admitting: Internal Medicine

## 2015-10-31 ENCOUNTER — Observation Stay (HOSPITAL_COMMUNITY): Payer: BLUE CROSS/BLUE SHIELD

## 2015-10-31 DIAGNOSIS — R079 Chest pain, unspecified: Secondary | ICD-10-CM | POA: Diagnosis present

## 2015-10-31 DIAGNOSIS — I2510 Atherosclerotic heart disease of native coronary artery without angina pectoris: Secondary | ICD-10-CM

## 2015-10-31 DIAGNOSIS — F419 Anxiety disorder, unspecified: Secondary | ICD-10-CM | POA: Diagnosis present

## 2015-10-31 DIAGNOSIS — E669 Obesity, unspecified: Secondary | ICD-10-CM | POA: Diagnosis present

## 2015-10-31 DIAGNOSIS — R072 Precordial pain: Secondary | ICD-10-CM | POA: Diagnosis not present

## 2015-10-31 DIAGNOSIS — I10 Essential (primary) hypertension: Secondary | ICD-10-CM | POA: Diagnosis present

## 2015-10-31 DIAGNOSIS — R001 Bradycardia, unspecified: Secondary | ICD-10-CM | POA: Diagnosis present

## 2015-10-31 DIAGNOSIS — I25110 Atherosclerotic heart disease of native coronary artery with unstable angina pectoris: Principal | ICD-10-CM | POA: Diagnosis present

## 2015-10-31 DIAGNOSIS — I209 Angina pectoris, unspecified: Secondary | ICD-10-CM | POA: Diagnosis not present

## 2015-10-31 DIAGNOSIS — Z7982 Long term (current) use of aspirin: Secondary | ICD-10-CM

## 2015-10-31 DIAGNOSIS — Z955 Presence of coronary angioplasty implant and graft: Secondary | ICD-10-CM

## 2015-10-31 DIAGNOSIS — F329 Major depressive disorder, single episode, unspecified: Secondary | ICD-10-CM | POA: Diagnosis present

## 2015-10-31 DIAGNOSIS — I25119 Atherosclerotic heart disease of native coronary artery with unspecified angina pectoris: Secondary | ICD-10-CM

## 2015-10-31 DIAGNOSIS — I200 Unstable angina: Secondary | ICD-10-CM | POA: Insufficient documentation

## 2015-10-31 DIAGNOSIS — Z79899 Other long term (current) drug therapy: Secondary | ICD-10-CM

## 2015-10-31 DIAGNOSIS — Z6841 Body Mass Index (BMI) 40.0 and over, adult: Secondary | ICD-10-CM

## 2015-10-31 HISTORY — DX: Atherosclerotic heart disease of native coronary artery without angina pectoris: I25.10

## 2015-10-31 LAB — CBC
HCT: 38.3 % (ref 36.0–46.0)
Hemoglobin: 12.9 g/dL (ref 12.0–15.0)
MCH: 32.3 pg (ref 26.0–34.0)
MCHC: 33.7 g/dL (ref 30.0–36.0)
MCV: 96 fL (ref 78.0–100.0)
PLATELETS: 246 10*3/uL (ref 150–400)
RBC: 3.99 MIL/uL (ref 3.87–5.11)
RDW: 12.8 % (ref 11.5–15.5)
WBC: 6.4 10*3/uL (ref 4.0–10.5)

## 2015-10-31 LAB — BASIC METABOLIC PANEL
Anion gap: 11 (ref 5–15)
BUN: 16 mg/dL (ref 6–20)
CALCIUM: 9.3 mg/dL (ref 8.9–10.3)
CO2: 22 mmol/L (ref 22–32)
Chloride: 104 mmol/L (ref 101–111)
Creatinine, Ser: 1.04 mg/dL — ABNORMAL HIGH (ref 0.44–1.00)
GFR calc Af Amer: >60 mL/min (ref >60)
GFR, EST NON AFRICAN AMERICAN: 56 mL/min — AB (ref >60)
Glucose, Bld: 114 mg/dL — ABNORMAL HIGH (ref 65–99)
POTASSIUM: 4 mmol/L (ref 3.5–5.1)
SODIUM: 137 mmol/L (ref 135–145)

## 2015-10-31 LAB — TROPONIN I

## 2015-10-31 LAB — D-DIMER, QUANTITATIVE (NOT AT ARMC): D DIMER QUANT: 0.83 ug{FEU}/mL — AB (ref 0.00–0.50)

## 2015-10-31 LAB — I-STAT TROPONIN, ED: TROPONIN I, POC: 0.01 ng/mL (ref 0.00–0.08)

## 2015-10-31 LAB — TSH: TSH: 3.229 u[IU]/mL (ref 0.350–4.500)

## 2015-10-31 MED ORDER — LISINOPRIL 10 MG PO TABS
20.0000 mg | ORAL_TABLET | Freq: Every day | ORAL | Status: DC
  Administered 2015-11-01: 20 mg via ORAL
  Filled 2015-10-31: qty 2

## 2015-10-31 MED ORDER — HEPARIN BOLUS VIA INFUSION
4000.0000 [IU] | Freq: Once | INTRAVENOUS | Status: AC
  Administered 2015-10-31: 4000 [IU] via INTRAVENOUS
  Filled 2015-10-31: qty 4000

## 2015-10-31 MED ORDER — IOHEXOL 350 MG/ML SOLN
100.0000 mL | Freq: Once | INTRAVENOUS | Status: AC | PRN
  Administered 2015-10-31: 100 mL via INTRAVENOUS

## 2015-10-31 MED ORDER — GI COCKTAIL ~~LOC~~: 30.0000 mL | Freq: Four times a day (QID) | ORAL | Status: DC | PRN

## 2015-10-31 MED ORDER — CITALOPRAM HYDROBROMIDE 20 MG PO TABS
40.0000 mg | ORAL_TABLET | Freq: Every day | ORAL | Status: DC
  Administered 2015-11-01 – 2015-11-02 (×2): 40 mg via ORAL
  Filled 2015-10-31 (×3): qty 2

## 2015-10-31 MED ORDER — MORPHINE SULFATE (PF) 2 MG/ML IV SOLN
2.0000 mg | INTRAVENOUS | Status: DC | PRN
Start: 2015-10-31 — End: 2015-11-02

## 2015-10-31 MED ORDER — PANTOPRAZOLE SODIUM 40 MG PO TBEC
40.0000 mg | DELAYED_RELEASE_TABLET | Freq: Every day | ORAL | Status: DC
  Administered 2015-11-01: 40 mg via ORAL
  Filled 2015-10-31: qty 1

## 2015-10-31 MED ORDER — AMLODIPINE BESYLATE 5 MG PO TABS
5.0000 mg | ORAL_TABLET | Freq: Every day | ORAL | Status: DC
  Administered 2015-11-01: 5 mg via ORAL
  Filled 2015-10-31: qty 1

## 2015-10-31 MED ORDER — HEPARIN (PORCINE) IN NACL 100-0.45 UNIT/ML-% IJ SOLN
850.0000 [IU]/h | INTRAMUSCULAR | Status: DC
  Administered 2015-10-31: 850 [IU]/h via INTRAVENOUS
  Filled 2015-10-31: qty 250

## 2015-10-31 MED ORDER — ASPIRIN EC 81 MG PO TBEC
81.0000 mg | DELAYED_RELEASE_TABLET | Freq: Every day | ORAL | Status: DC
  Administered 2015-11-02: 10:00:00 81 mg via ORAL
  Filled 2015-10-31 (×2): qty 1

## 2015-10-31 MED ORDER — ASPIRIN 81 MG PO CHEW
324.0000 mg | CHEWABLE_TABLET | Freq: Once | ORAL | Status: AC
  Administered 2015-10-31: 324 mg via ORAL
  Filled 2015-10-31: qty 4

## 2015-10-31 MED ORDER — SODIUM CHLORIDE 0.9 % IV BOLUS (SEPSIS)
500.0000 mL | Freq: Once | INTRAVENOUS | Status: AC
  Administered 2015-10-31: 500 mL via INTRAVENOUS

## 2015-10-31 MED ORDER — ACETAMINOPHEN 325 MG PO TABS: 650.0000 mg | ORAL_TABLET | ORAL | Status: DC | PRN

## 2015-10-31 MED ORDER — SODIUM CHLORIDE 0.9 % IV SOLN
INTRAVENOUS | Status: DC
  Administered 2015-10-31: 23:00:00 via INTRAVENOUS

## 2015-10-31 MED ORDER — ONDANSETRON HCL 4 MG/2ML IJ SOLN: 4.0000 mg | Freq: Four times a day (QID) | INTRAMUSCULAR | Status: DC | PRN

## 2015-10-31 MED ORDER — NITROGLYCERIN 0.4 MG SL SUBL
0.4000 mg | SUBLINGUAL_TABLET | SUBLINGUAL | Status: DC | PRN
  Administered 2015-10-31: 0.4 mg via SUBLINGUAL
  Filled 2015-10-31: qty 1

## 2015-10-31 MED ORDER — ATORVASTATIN CALCIUM 80 MG PO TABS: 80.0000 mg | ORAL_TABLET | Freq: Every day | ORAL | Status: DC

## 2015-10-31 MED ORDER — NITROGLYCERIN 0.4 MG SL SUBL: 0.4000 mg | SUBLINGUAL_TABLET | SUBLINGUAL | Status: DC | PRN

## 2015-10-31 MED ORDER — ENOXAPARIN SODIUM 40 MG/0.4ML ~~LOC~~ SOLN: 40.0000 mg | SUBCUTANEOUS | Status: DC

## 2015-10-31 MED ORDER — ASPIRIN EC 325 MG PO TBEC
325.0000 mg | DELAYED_RELEASE_TABLET | Freq: Every day | ORAL | Status: DC
  Administered 2015-11-01: 325 mg via ORAL
  Filled 2015-10-31: qty 1

## 2015-10-31 NOTE — ED Notes (Signed)
Pt reports to the ED for eval of midsternal CP that started yesterday. PT reports she has a hx of this happening before but has never been to the doctor for it. Pt reports associated symptoms of SOB and back pain. Pt reports relaxing makes the pain better and movement makes the pain worse. Pt A&Ox4, resp e/u, and skin warm and dry.

## 2015-10-31 NOTE — Progress Notes (Signed)
ANTICOAGULATION CONSULT NOTE - Initial Consult  Pharmacy Consult for Heparin Indication: chest pain/ACS  No Known Allergies  Patient Measurements: Height: 4' 11.84" (152 cm) Weight: 211 lb 6.4 oz (95.89 kg) IBW/kg (Calculated) : 45.14 Heparin Dosing Weight: 68 kg  Vital Signs: Temp: 97.9 F (36.6 C) (03/08 1317) Temp Source: Oral (03/08 1317) BP: 140/67 mmHg (03/08 2100) Pulse Rate: 47 (03/08 2100)  Labs:  Recent Labs  10/31/15 1330  HGB 12.9  HCT 38.3  PLT 246  CREATININE 1.04*    CrCl cannot be calculated (Unknown ideal weight.).   Medical History: Past Medical History  Diagnosis Date  . Hypertension   . Anxiety   . Depression (emotion)   . Full dentures     Medications:  Prescriptions prior to admission  Medication Sig Dispense Refill Last Dose  . aspirin 81 MG tablet Take 81 mg by mouth daily.   10/31/2015 at Unknown time  . bisoprolol-hydrochlorothiazide (ZIAC) 10-6.25 MG tablet Take 1 tablet by mouth daily.   10/31/2015 at Unknown time  . citalopram (CELEXA) 40 MG tablet Take 40 mg by mouth daily.   10/31/2015 at Unknown time  . lisinopril-hydrochlorothiazide (PRINZIDE,ZESTORETIC) 20-25 MG tablet Take 1 tablet by mouth daily. Patient states she is taking the Ziac as well. Doctor ask her to take both medications per patient   10/31/2015 at Unknown time  . diclofenac (VOLTAREN) 75 MG EC tablet Take 1 tablet (75 mg total) by mouth 2 (two) times daily. (Patient not taking: Reported on 10/31/2015) 50 tablet 2 Not Taking at Unknown time   Scheduled:  . amLODipine  5 mg Oral Daily  . aspirin EC  325 mg Oral Daily  . aspirin EC  81 mg Oral Daily  . [START ON 11/01/2015] atorvastatin  80 mg Oral q1800  . citalopram  40 mg Oral Daily  . enoxaparin (LOVENOX) injection  40 mg Subcutaneous Q24H  . lisinopril  20 mg Oral Daily  . [START ON 11/01/2015] pantoprazole  40 mg Oral Q1200   Infusions:  . sodium chloride      Assessment: 65yo female presents with exertional CP.  Pharmacy is consulted to dose heparin for ACS/chest pain.  Goal of Therapy:  Heparin level 0.3-0.7 units/ml Monitor platelets by anticoagulation protocol: Yes   Plan:  Give 4000 units bolus x 1 Start heparin infusion at 850 units/hr Check anti-Xa level in 6 hours and daily while on heparin Continue to monitor H&H and platelets  Arlean Hoppingorey M. Newman PiesBall, PharmD, BCPS Clinical Pharmacist Pager [phone] 10/31/2015,9:46 PM

## 2015-10-31 NOTE — H&P (Signed)
PATIENT DETAILS Name: Tonya Bryant Age: 65 y.o. Sex: female Date of Birth: 26-Dec-1950 Admit Date: 10/31/2015 NWG:[id number],QMVHQIPCP:EHINGER,ROBERT R, MD Referring Physician: Dr. Criss AlvineGoldston   CHIEF COMPLAINT:  Chest pain  HPI: Tonya Bryant is a 65 y.o. female with a Past Medical History of hypertension, obesity who presents with chest pain that has been intermittently ongoing for the past 1 month. Patient describes the pain as pressure-like, mostly in the center for chest, around 8/10 at its severity, occasionally radiating to her left shoulder, left side of her neck and down her arms. Initially, pain was mostly occurring with exertion, per patient she had to rest for a few minutes before continuing with exertional activity. Lately, it has gotten more frequent, and has now started to occur at rest as well. Pain is associated with shortness of breath and occasional nausea. No history of recent travel. No prior history of heart disease/CAD. No abdominal pain, nausea, vomiting or diarrhea. No fever, frequency of urination or dysuria.  ALLERGIES:  No Known Allergies  PAST MEDICAL HISTORY: Past Medical History  Diagnosis Date  . Hypertension   . Anxiety   . Depression (emotion)   . Full dentures     PAST SURGICAL HISTORY: Past Surgical History  Procedure Laterality Date  . Breast surgery      lt br bx-neg  . Toe osteotomy      rt big toe  . Tubal ligation    . Colonoscopy    . Tooth extraction    . Carpal tunnel release Left 07/07/2013    Procedure: LEFT CARPAL TUNNEL RELEASE;  Surgeon: Wyn Forsterobert V Sypher Jr., MD;  Location: [facility];  Service: Orthopedics;  Laterality: Left;  . Trigger finger release Left 07/07/2013    Procedure: LEFT THUMB RELEASE A-1 PULLEY;  Surgeon: Wyn Forsterobert V Sypher Jr., MD;  Location: [facility];  Service: Orthopedics;  Laterality: Left;    MEDICATIONS AT HOME: Prior to Admission medications   Medication Sig Start Date End  Date Taking? Authorizing Provider  aspirin 81 MG tablet Take 81 mg by mouth daily.   Yes Historical Provider, MD  bisoprolol-hydrochlorothiazide (ZIAC) 10-6.25 MG tablet Take 1 tablet by mouth daily.   Yes Historical Provider, MD  citalopram (CELEXA) 40 MG tablet Take 40 mg by mouth daily.   Yes Historical Provider, MD  lisinopril-hydrochlorothiazide (PRINZIDE,ZESTORETIC) 20-25 MG tablet Take 1 tablet by mouth daily. Patient states she is taking the Ziac as well. Doctor ask her to take both medications per patient   Yes Historical Provider, MD  diclofenac (VOLTAREN) 75 MG EC tablet Take 1 tablet (75 mg total) by mouth 2 (two) times daily. Patient not taking: Reported on 10/31/2015 04/24/15   Lenn SinkNorman S Regal, DPM    FAMILY HISTORY: Family History  Problem Relation Age of Onset  . Cancer Mother   . Diabetes Mother   . Stroke Mother   . Hypertension Mother   . Stroke Father   . Diabetes Father   . Hypertension Father   . Gout CAD Father Father/Mother/Brother     SOCIAL HISTORY:  reports that she has never smoked. She does not have any smokeless tobacco history on file. She reports that she drinks about 0.5 oz of alcohol per week. She reports that she does not use illicit drugs. Lives at: Home Mobility: Independent  REVIEW OF SYSTEMS:  Constitutional:   No  weight loss, night sweats,  Fevers, chills, fatigue.  HEENT:  No headaches, Dysphagia,Tooth/dental problems,Sore throat,  No sneezing, itching, ear ache, nasal congestion, post nasal drip  Cardio-vascular: No Orthopnea, PND,lower extremity edema, anasarca, palpitations  GI:  No heartburn, indigestion, abdominal pain, nausea, vomiting, diarrhea, melena or hematochezia  Resp: No shortness of breath, cough, hemoptysis,plueritic chest pain.   Skin:  No rash or lesions.  GU:  No dysuria, change in color of urine, no urgency or frequency.  No flank pain.  Musculoskeletal: No joint pain or swelling.  No decreased range of  motion.  No back pain.  Endocrine: No heat intolerance, no cold intolerance, no polyuria, no polydipsia  Psych: No change in mood or affect. No depression or anxiety.  No memory loss.   PHYSICAL EXAM: Blood pressure 138/78, pulse 51, temperature 97.9 F (36.6 C), temperature source Oral, resp. rate 16, SpO2 95 %.  General appearance :Awake, alert, not in any distress. Speech Clear. Not toxic Looking HEENT: Atraumatic and Normocephalic, pupils equally reactive to light and accomodation Neck: supple, no JVD. No cervical lymphadenopathy.  Chest:Good air entry bilaterally, no added sounds  CVS: S1 S2 regular, no murmurs.  Abdomen: Bowel sounds present, Non tender and not distended with no gaurding, rigidity or rebound. Extremities: B/L Lower Ext shows no edema, both legs are warm to touch Neurology:  Non focal Skin:No Rash Wounds:N/A  LABS ON ADMISSION:   Recent Labs  10/31/15 1330  NA 137  K 4.0  CL 104  CO2 22  GLUCOSE 114*  BUN 16  CREATININE 1.04*  CALCIUM 9.3   No results for input(s): AST, ALT, ALKPHOS, BILITOT, PROT, ALBUMIN in the last 72 hours. No results for input(s): LIPASE, AMYLASE in the last 72 hours.  Recent Labs  10/31/15 1330  WBC 6.4  HGB 12.9  HCT 38.3  MCV 96.0  PLT 246   No results for input(s): CKTOTAL, CKMB, CKMBINDEX, TROPONINI in the last 72 hours. No results for input(s): DDIMER in the last 72 hours. Invalid input(s): POCBNP   RADIOLOGIC STUDIES ON ADMISSION: Dg Chest 2 View  10/31/2015  CLINICAL DATA:  Medial chest pain, pain down LEFT arm, across shoulder and into LEFT neck beginning yesterday; shortness of breath, cough, hypertension EXAM: CHEST  2 VIEW COMPARISON:  08/29/2009 FINDINGS: Mild enlargement of cardiac silhouette. Mediastinal contours and pulmonary vascularity normal. Lungs clear. No pleural effusion or pneumothorax. Mild endplate spur formation thoracic spine. IMPRESSION: Mild enlargement of cardiac silhouette. No acute  abnormalities. Electronically Signed   By: Ulyses Southward M.D.   On: 10/31/2015 14:06    I have personally reviewed images of chest xray   EKG: Personally reviewed. Sinus bradycardia  ASSESSMENT AND PLAN: Present on Admission:  . Exertional chest pain: With some typical features-risk factors include hypertension, family history and obesity. Admit to telemetry, cycle cardiac enzymes. Start aspirin, hold off on beta blockers given bradycardia into the 40s. Keep nothing by mouth postmidnight, have consulted cardiology for risk stratification prior to discharge.  Marland Kitchen HTN (hypertension): Will continue lisinopril, hold both HCTZ and beta blocker for now-start amlodipine. Follow and optimize accordingly.   . Bradycardia: Hold bisoprolol-check TSH. Heart rate currently in the 40s-but patient asymptomatic. Monitor closely in telemetry.  . Obesity: Will need ongoing counseling regarding importance of weight loss.  Further plan will depend as patient's clinical course evolves and further radiologic and laboratory data become available. Patient will be monitored closely.  Above noted plan was discussed with patient/family  face to face at bedside, they were in agreement.   CONSULTS:  None  DVT Prophylaxis: Prophylactic Lovenox   Code Status: Full Code  Disposition Plan:  Discharge back home 1-2 days,but may warrant SNF depending on clinical course  Total time spent 35 minutes.Greater than 50% of this time was spent in counseling, explanation of diagnosis, planning of further management, and coordination of care.  [facility] Pager [phone]  If 7PM-7AM, please contact night-coverage [email or web address] Password [facility] 10/31/2015, 5:58 PM

## 2015-10-31 NOTE — Consult Note (Addendum)
Admit date: 10/31/2015 Referring Physician  Dr. Jerral Ralph Primary Physician  Dr. Blair Heys Primary Cardiologist  None Reason for Consultation  Chest pain  HPI: This is a 64yo AAF with a history of HTN, obesity and anxiety who was in her USOH until the past month when she started experiencing exertional CP.  She describes the pain and a pressure in the midsternal area with radiation to her neck and down her arms.  Over the past few days it has has become more frequent.  It is associated with SOB but no nausea or diaphoresis.  The pain occurred again last night and has been intermittent since then.  Currently she is pain free.  Cardiology is now asked to consult for evaluation.  She is on a BB for her HTN and has been bradycardic as low as the low 40's in the ER.  She denies any weakness, dizziness or syncope.  She denies any palptiations.       PMH:   Past Medical History  Diagnosis Date  . Hypertension   . Anxiety   . Depression (emotion)   . Full dentures      PSH:   Past Surgical History  Procedure Laterality Date  . Breast surgery      lt br bx-neg  . Toe osteotomy      rt big toe  . Tubal ligation    . Colonoscopy    . Tooth extraction    . Carpal tunnel release Left 07/07/2013    Procedure: LEFT CARPAL TUNNEL RELEASE;  Surgeon: Wyn Forster., MD;  Location: [facility];  Service: Orthopedics;  Laterality: Left;  . Trigger finger release Left 07/07/2013    Procedure: LEFT THUMB RELEASE A-1 PULLEY;  Surgeon: Wyn Forster., MD;  Location:  [facility];  Service: Orthopedics;  Laterality: Left;    Allergies:  Review of patient's allergies indicates no known allergies. Prior to Admit Meds:   (Not in a hospital admission) Fam HX:    Family History  Problem Relation Age of Onset  . Cancer Mother   . Diabetes Mother   . Stroke Mother   . Hypertension Mother   . Stroke Father   . Diabetes Father   . Hypertension Father   . Gout  Father    Social HX:    Social History   Social History  . Marital Status: Single    Spouse Name: N/A  . Number of Children: N/A  . Years of Education: N/A   Occupational History  . Not on file.   Social History Main Topics  . Smoking status: Never Smoker   . Smokeless tobacco: Not on file  . Alcohol Use: 0.5 oz/week    1 drink(s) per week  . Drug Use: No  . Sexual Activity: Not on file   Other Topics Concern  . Not on file   Social History Narrative     ROS:  All 11 ROS were addressed and are negative except what is stated in the HPI  Physical Exam: Blood pressure 140/67, pulse 47, temperature 97.9 F (36.6 C), temperature source Oral, resp. rate 12, SpO2 98 %.    General: Well developed, well nourished, in no acute distress Head: Eyes PERRLA, No xanthomas.   Normal cephalic and atramatic  Lungs:   Clear bilaterally to auscultation and percussion. Heart:   HRRR bradycardic S1 S2 Pulses are 2+ & equal.  No carotid bruit. No JVD.  No abdominal bruits. No femoral bruits. Abdomen: Bowel sounds are positive, abdomen soft and non-tender without masses Extremities:   No clubbing, cyanosis or edema.  DP +1 Neuro: Alert and oriented X 3. Psych:  Good affect, responds appropriately    Labs:   Lab Results  Component Value Date   WBC 6.4 10/31/2015   HGB 12.9 10/31/2015   HCT 38.3 10/31/2015   MCV 96.0 10/31/2015   PLT 246 10/31/2015    Recent Labs Lab 10/31/15 1330  NA 137  K 4.0  CL 104  CO2 22  BUN 16  CREATININE 1.04*  CALCIUM 9.3  GLUCOSE 114*   No results found for: PTT No results found for: INR, PROTIME Lab Results  Component Value Date   CKTOTAL 205* 08/30/2009   CKMB 1.3 08/30/2009   TROPONINI 0.01        NO INDICATION OF MYOCARDIAL INJURY. 08/30/2009     Lab Results  Component Value Date   CHOL  08/30/2009    191        ATP III CLASSIFICATION:  <200     mg/dL   Desirable  161-096  mg/dL   Borderline High  >=045    mg/dL    High          Lab Results  Component Value Date   HDL 76 08/30/2009   Lab Results  Component Value Date   LDLCALC * 08/30/2009    110        Total Cholesterol/HDL:CHD Risk Coronary Heart Disease Risk Table                     Men   Women  1/2 Average Risk   3.4   3.3  Average Risk       5.0   4.4  2 X Average Risk   9.6   7.1  3 X Average Risk  23.4   11.0        Use the calculated Patient Ratio above and the CHD Risk Table to determine the patient's CHD Risk.        ATP III CLASSIFICATION (LDL):  <100     mg/dL   Optimal  409-811  mg/dL   Near or Above                    Optimal  130-159  mg/dL   Borderline  914-782  mg/dL   High  >956     mg/dL   Very High   Lab Results  Component Value Date   TRIG 24 08/30/2009   Lab Results  Component Value Date   CHOLHDL 2.5 08/30/2009   No results found for: LDLDIRECT    Radiology:  Dg Chest 2 View  10/31/2015  CLINICAL DATA:  Medial chest pain, pain down LEFT arm, across shoulder and into LEFT neck beginning yesterday; shortness of breath, cough, hypertension EXAM: CHEST  2 VIEW COMPARISON:  08/29/2009 FINDINGS: Mild enlargement of cardiac silhouette. Mediastinal contours and pulmonary vascularity normal. Lungs clear. No pleural effusion or pneumothorax. Mild endplate spur formation thoracic spine. IMPRESSION: Mild enlargement of cardiac silhouette. No acute abnormalities. Electronically Signed   By: Ulyses Southward M.D.   On: 10/31/2015 14:06    EKG:  Sinus bradycardia in the low 40's with nonspecific ST abnormality  ASSESSMENT/PLAN: 1.  Crescendo angina - this has been occurring more frequently over the past month and is very concerning for coronary ischemia.  Pain occurs with exertion and rest and radiates into her neck and arms.  EKG shows nonspecific ST abnormality.  CRFs include HTN.  Her symptoms are very concerning for coronary ischemia but her D-Dimer is also elevated so need to rule out acute PE.  Will get chest CT angio to  rule out PE.  Recommend NPO after MN for cath vs. Nuclear stress test in am if chest CT neg for PE. Check 2D echo in am to assess LVF. Start ASA/IV Heparin gtt/statin.  No BB secondary to bradycardia.   2.  HTN - controlled.  Agree with holding BB and adding amlodipine for HTN.  Also continue ACE I.   3.  Bradycardia - she denies any weakness or fatigue but her CP may be due to low CO from significant bradycardia.  Will stop Ziac. Check TSH.     Quintella ReichertURNER,Jamilah Jean R, MD  10/31/2015  9:20 PM

## 2015-10-31 NOTE — ED Provider Notes (Signed)
CSN: [id number]     Arrival date & time 10/31/15  1307 History   First MD Initiated Contact with Patient 10/31/15 1641     Chief Complaint  Patient presents with  . Chest Pain     (Consider location/radiation/quality/duration/timing/severity/associated sxs/prior Treatment) HPI  65 year old female presents with worsening chest pain. She describes the pain as a tightness. Originally started about one month ago. Occurs typically whenever she is walking or exerting herself. She gets associated shortness of breath, nausea, feels warm, and has pain in her posterior and lateral neck. Some pain down her arm. Patient states that over the last 24 hours the pain has come and gone and is occurring with smaller and smaller exertional effort. Most recently started about 1-2 hours prior to arrival. Patient states that since she has started resting the symptoms have improved but not fully gone away. Has a history of hypertension, denies diabetes, hypercholesterolemia, or smoking. Does have a family history of coronary disease. States her pain is much milder than it was on initial presentation. No pleuritic pain. No leg swelling.   Past Medical History  Diagnosis Date  . Hypertension   . Anxiety   . Depression (emotion)   . Full dentures    Past Surgical History  Procedure Laterality Date  . Breast surgery      lt br bx-neg  . Toe osteotomy      rt big toe  . Tubal ligation    . Colonoscopy    . Tooth extraction    . Carpal tunnel release Left 07/07/2013    Procedure: LEFT CARPAL TUNNEL RELEASE;  Surgeon: Wyn Forster., MD;  Location: [facility];  Service: Orthopedics;  Laterality: Left;  . Trigger finger release Left 07/07/2013    Procedure: LEFT THUMB RELEASE A-1 PULLEY;  Surgeon: Wyn Forster., MD;  Location: [facility];  Service: Orthopedics;  Laterality: Left;   Family History  Problem Relation Age of Onset  . Cancer Mother   . Diabetes Mother   .  Stroke Mother   . Hypertension Mother   . Stroke Father   . Diabetes Father   . Hypertension Father   . Gout Father    Social History  Substance Use Topics  . Smoking status: Never Smoker   . Smokeless tobacco: None  . Alcohol Use: 0.5 oz/week    1 drink(s) per week   OB History    No data available     Review of Systems  Constitutional: Negative for diaphoresis.  Respiratory: Positive for shortness of breath.   Cardiovascular: Positive for chest pain.  Gastrointestinal: Positive for nausea. Negative for vomiting and abdominal pain.  Musculoskeletal: Positive for back pain (low back) and neck pain.  All other systems reviewed and are negative.     Allergies  Review of patient's allergies indicates no known allergies.  Home Medications   Prior to Admission medications   Medication Sig Start Date End Date Taking? Authorizing Provider  aspirin 81 MG tablet Take 81 mg by mouth daily.    Historical Provider, MD  bisoprolol-hydrochlorothiazide [employer]) 5-6.25 MG per tablet Take 1 tablet by mouth daily.    Historical Provider, MD  citalopram (CELEXA) 40 MG tablet Take 40 mg by mouth daily.    Historical Provider, MD  diclofenac (VOLTAREN) 75 MG EC tablet Take 1 tablet (75 mg total) by mouth 2 (two) times daily. 04/24/15   Lenn Sink, DPM  lisinopril (PRINIVIL,ZESTRIL) 20 MG tablet  Take 20 mg by mouth daily.    Historical Provider, MD   BP 138/78 mmHg  Pulse 51  Temp(Src) 97.9 F (36.6 C) (Oral)  Resp 16  SpO2 95% Physical Exam  Constitutional: She is oriented to person, place, and time. She appears well-developed and well-nourished.  HENT:  Head: Normocephalic and atraumatic.  Right Ear: External ear normal.  Left Ear: External ear normal.  Nose: Nose normal.  Eyes: Right eye exhibits no discharge. Left eye exhibits no discharge.  Cardiovascular: Normal rate, regular rhythm and normal heart sounds.   Pulses:      Radial pulses are 2+ on the right side, and 2+ on  the left side.  Pulmonary/Chest: Effort normal and breath sounds normal. She exhibits no tenderness.  Abdominal: Soft. She exhibits no distension. There is no tenderness.  Musculoskeletal: She exhibits no edema.  Neurological: She is alert and oriented to person, place, and time.  Skin: Skin is warm and dry. She is not diaphoretic.  Nursing note and vitals reviewed.   ED Course  Procedures (including critical care time) Labs Review Labs Reviewed  BASIC METABOLIC PANEL - Abnormal; Notable for the following:    Glucose, Bld 114 (*)    Creatinine, Ser 1.04 (*)    GFR calc non Af Amer 56 (*)    All other components within normal limits  CBC  I-STAT TROPOININ, ED    Imaging Review Dg Chest 2 View  10/31/2015  CLINICAL DATA:  Medial chest pain, pain down LEFT arm, across shoulder and into LEFT neck beginning yesterday; shortness of breath, cough, hypertension EXAM: CHEST  2 VIEW COMPARISON:  08/29/2009 FINDINGS: Mild enlargement of cardiac silhouette. Mediastinal contours and pulmonary vascularity normal. Lungs clear. No pleural effusion or pneumothorax. Mild endplate spur formation thoracic spine. IMPRESSION: Mild enlargement of cardiac silhouette. No acute abnormalities. Electronically Signed   By: Ulyses SouthwardMark  Boles M.D.   On: 10/31/2015 14:06   I have personally reviewed and evaluated these images and lab results as part of my medical decision-making.   EKG Interpretation   Date/Time:  Wednesday October 31 2015 13:13:35 EST Ventricular Rate:  51 PR Interval:  180 QRS Duration: 82 QT Interval:  450 QTC Calculation: 414 R Axis:   10 Text Interpretation:  Sinus bradycardia Cannot rule out Anterior infarct ,  age undetermined Abnormal ECG no significant change since 2015 Confirmed  by Jidenna Figgs  MD, Addis Bennie [phone]([id number]) on 10/31/2015 4:20:44 PM       EKG Interpretation  Date/Time:  Wednesday October 31 2015 17:22:01 EST Ventricular Rate:  42 PR Interval:  194 QRS Duration: 93 QT  Interval:  477 QTC Calculation: 399 R Axis:   17 Text Interpretation:  Sinus bradycardia Anteroseptal infarct, age indeterminate nonspecific ST/T waves similar to earlier Confirmed by Shabazz Mckey  MD, November Sypher ([id number]) on 10/31/2015 5:26:40 PM       MDM   Final diagnoses:  Exertional chest pain    Patient's exertional chest pain seems to be worsening over the last 24 hours. Her HEART score is a 5. Pain improving now with rest. Nonspecific ST/T with bradycardia but not significantly changed. Doubt dissection. PE seems unlikely as well, especially with intermittent symptoms for weeks and no risk factors. Plan to admit to hospitalist for CP workup/ACS rule out.    Pricilla LovelessScott Isola Mehlman, MD 10/31/15 1754

## 2015-11-01 ENCOUNTER — Encounter (HOSPITAL_COMMUNITY)
Admission: EM | Disposition: A | Discharge status: Home / Self Care | Payer: Self-pay | Source: Home / Self Care | Attending: Internal Medicine

## 2015-11-01 ENCOUNTER — Ambulatory Visit (HOSPITAL_BASED_OUTPATIENT_CLINIC_OR_DEPARTMENT_OTHER): Payer: BLUE CROSS/BLUE SHIELD

## 2015-11-01 DIAGNOSIS — Z6841 Body Mass Index (BMI) 40.0 and over, adult: Secondary | ICD-10-CM | POA: Diagnosis not present

## 2015-11-01 DIAGNOSIS — I10 Essential (primary) hypertension: Secondary | ICD-10-CM | POA: Diagnosis present

## 2015-11-01 DIAGNOSIS — I200 Unstable angina: Secondary | ICD-10-CM | POA: Diagnosis not present

## 2015-11-01 DIAGNOSIS — R079 Chest pain, unspecified: Secondary | ICD-10-CM | POA: Diagnosis present

## 2015-11-01 DIAGNOSIS — Z79899 Other long term (current) drug therapy: Secondary | ICD-10-CM | POA: Diagnosis not present

## 2015-11-01 DIAGNOSIS — E669 Obesity, unspecified: Secondary | ICD-10-CM | POA: Diagnosis present

## 2015-11-01 DIAGNOSIS — R072 Precordial pain: Secondary | ICD-10-CM | POA: Diagnosis not present

## 2015-11-01 DIAGNOSIS — I209 Angina pectoris, unspecified: Secondary | ICD-10-CM | POA: Diagnosis not present

## 2015-11-01 DIAGNOSIS — I25110 Atherosclerotic heart disease of native coronary artery with unstable angina pectoris: Secondary | ICD-10-CM | POA: Diagnosis present

## 2015-11-01 DIAGNOSIS — Z7982 Long term (current) use of aspirin: Secondary | ICD-10-CM | POA: Diagnosis not present

## 2015-11-01 DIAGNOSIS — R001 Bradycardia, unspecified: Secondary | ICD-10-CM | POA: Diagnosis present

## 2015-11-01 DIAGNOSIS — F329 Major depressive disorder, single episode, unspecified: Secondary | ICD-10-CM | POA: Diagnosis present

## 2015-11-01 DIAGNOSIS — F419 Anxiety disorder, unspecified: Secondary | ICD-10-CM | POA: Diagnosis present

## 2015-11-01 HISTORY — PX: CARDIAC CATHETERIZATION: SHX172

## 2015-11-01 LAB — BASIC METABOLIC PANEL
Anion gap: 10 (ref 5–15)
BUN: 13 mg/dL (ref 6–20)
CHLORIDE: 106 mmol/L (ref 101–111)
CO2: 24 mmol/L (ref 22–32)
CREATININE: 1.04 mg/dL — AB (ref 0.44–1.00)
Calcium: 9 mg/dL (ref 8.9–10.3)
GFR calc Af Amer: >60 mL/min (ref >60)
GFR calc non Af Amer: 56 mL/min — ABNORMAL LOW (ref >60)
Glucose, Bld: 110 mg/dL — ABNORMAL HIGH (ref 65–99)
POTASSIUM: 3.9 mmol/L (ref 3.5–5.1)
SODIUM: 140 mmol/L (ref 135–145)

## 2015-11-01 LAB — LIPID PANEL
CHOL/HDL RATIO: 3.6 ratio
CHOL/HDL RATIO: 3.7 ratio
CHOLESTEROL: 195 mg/dL (ref 0–200)
Cholesterol: 194 mg/dL (ref 0–200)
HDL: 53 mg/dL (ref >40)
HDL: 54 mg/dL (ref >40)
LDL CALC: 126 mg/dL — AB (ref 0–99)
LDL CALC: 126 mg/dL — AB (ref 0–99)
Triglycerides: 68 mg/dL (ref <150)
Triglycerides: 81 mg/dL (ref <150)
VLDL: 14 mg/dL (ref 0–40)
VLDL: 16 mg/dL (ref 0–40)

## 2015-11-01 LAB — HEPARIN LEVEL (UNFRACTIONATED)
HEPARIN UNFRACTIONATED: 0.5 [IU]/mL (ref 0.30–0.70)
Heparin Unfractionated: 0.62 IU/mL (ref 0.30–0.70)

## 2015-11-01 LAB — CBC
HCT: 37.9 % (ref 36.0–46.0)
Hemoglobin: 12.7 g/dL (ref 12.0–15.0)
MCH: 32.1 pg (ref 26.0–34.0)
MCHC: 33.5 g/dL (ref 30.0–36.0)
MCV: 95.7 fL (ref 78.0–100.0)
PLATELETS: 233 10*3/uL (ref 150–400)
RBC: 3.96 MIL/uL (ref 3.87–5.11)
RDW: 12.8 % (ref 11.5–15.5)
WBC: 7.1 10*3/uL (ref 4.0–10.5)

## 2015-11-01 LAB — TROPONIN I

## 2015-11-01 LAB — ECHOCARDIOGRAM COMPLETE
Height: 59.843 in
WEIGHTICAEL: 3382.4 [oz_av]

## 2015-11-01 LAB — PROTIME-INR
INR: 1.04 (ref 0.00–1.49)
Prothrombin Time: 13.8 s (ref 11.6–15.2)

## 2015-11-01 LAB — POCT ACTIVATED CLOTTING TIME: ACTIVATED CLOTTING TIME: 410 s

## 2015-11-01 SURGERY — LEFT HEART CATH AND CORONARY ANGIOGRAPHY

## 2015-11-01 MED ORDER — MIDAZOLAM HCL 2 MG/2ML IJ SOLN
INTRAMUSCULAR | Status: DC | PRN
  Administered 2015-11-01: 2 mg via INTRAVENOUS
  Administered 2015-11-01: 1 mg via INTRAVENOUS

## 2015-11-01 MED ORDER — HEPARIN (PORCINE) IN NACL 2-0.9 UNIT/ML-% IJ SOLN
INTRAMUSCULAR | Status: AC
  Filled 2015-11-01: qty 1500

## 2015-11-01 MED ORDER — NITROGLYCERIN 1 MG/10 ML FOR IR/CATH LAB
INTRA_ARTERIAL | Status: DC | PRN
  Administered 2015-11-01 (×4): 200 ug via INTRACORONARY

## 2015-11-01 MED ORDER — VERAPAMIL HCL 2.5 MG/ML IV SOLN
INTRAVENOUS | Status: AC
  Filled 2015-11-01: qty 2

## 2015-11-01 MED ORDER — VERAPAMIL HCL 2.5 MG/ML IV SOLN
INTRAVENOUS | Status: DC | PRN
  Administered 2015-11-01: 19:00:00 via INTRA_ARTERIAL

## 2015-11-01 MED ORDER — TICAGRELOR 90 MG PO TABS
90.0000 mg | ORAL_TABLET | Freq: Two times a day (BID) | ORAL | Status: DC
Start: 2015-11-02 — End: 2015-11-02
  Administered 2015-11-02: 08:00:00 90 mg via ORAL
  Filled 2015-11-01: qty 1

## 2015-11-01 MED ORDER — HEPARIN SODIUM (PORCINE) 1000 UNIT/ML IJ SOLN
INTRAMUSCULAR | Status: DC | PRN
Start: 2015-11-01 — End: 2015-11-01
  Administered 2015-11-01: 5000 [IU] via INTRAVENOUS

## 2015-11-01 MED ORDER — LIDOCAINE HCL (PF) 1 % IJ SOLN
INTRAMUSCULAR | Status: AC
  Filled 2015-11-01: qty 30

## 2015-11-01 MED ORDER — SODIUM CHLORIDE 0.9 % WEIGHT BASED INFUSION
1.0000 mL/kg/h | INTRAVENOUS | Status: DC
  Administered 2015-11-01: 1 mL/kg/h via INTRAVENOUS

## 2015-11-01 MED ORDER — SODIUM CHLORIDE 0.9 % WEIGHT BASED INFUSION: 3.0000 mL/kg/h | INTRAVENOUS | Status: DC

## 2015-11-01 MED ORDER — BIVALIRUDIN 250 MG IV SOLR
INTRAVENOUS | Status: AC
  Filled 2015-11-01: qty 250

## 2015-11-01 MED ORDER — SODIUM CHLORIDE 0.9% FLUSH: 3.0000 mL | INTRAVENOUS | Status: DC | PRN

## 2015-11-01 MED ORDER — IOHEXOL 350 MG/ML SOLN
INTRAVENOUS | Status: DC | PRN
  Administered 2015-11-01: 145 mL via INTRA_ARTERIAL

## 2015-11-01 MED ORDER — FENTANYL CITRATE (PF) 100 MCG/2ML IJ SOLN
INTRAMUSCULAR | Status: AC
  Filled 2015-11-01: qty 2

## 2015-11-01 MED ORDER — HYDRALAZINE HCL 20 MG/ML IJ SOLN
10.0000 mg | INTRAMUSCULAR | Status: DC | PRN
  Administered 2015-11-01: 23:00:00 10 mg via INTRAVENOUS
  Filled 2015-11-01: qty 1

## 2015-11-01 MED ORDER — SODIUM CHLORIDE 0.9 % IV SOLN: 250.0000 mL | INTRAVENOUS | Status: DC | PRN

## 2015-11-01 MED ORDER — FENTANYL CITRATE (PF) 100 MCG/2ML IJ SOLN
INTRAMUSCULAR | Status: DC | PRN
  Administered 2015-11-01 (×2): 25 ug via INTRAVENOUS

## 2015-11-01 MED ORDER — NITROGLYCERIN 1 MG/10 ML FOR IR/CATH LAB
INTRA_ARTERIAL | Status: AC
  Filled 2015-11-01: qty 10

## 2015-11-01 MED ORDER — SODIUM CHLORIDE 0.9% FLUSH: 3.0000 mL | Freq: Two times a day (BID) | INTRAVENOUS | Status: DC

## 2015-11-01 MED ORDER — BIVALIRUDIN BOLUS VIA INFUSION - CUPID
INTRAVENOUS | Status: DC | PRN
  Administered 2015-11-01: 71.925 mg via INTRAVENOUS

## 2015-11-01 MED ORDER — TICAGRELOR 90 MG PO TABS
ORAL_TABLET | ORAL | Status: AC
  Filled 2015-11-01: qty 2

## 2015-11-01 MED ORDER — MIDAZOLAM HCL 2 MG/2ML IJ SOLN
INTRAMUSCULAR | Status: AC
  Filled 2015-11-01: qty 2

## 2015-11-01 MED ORDER — SODIUM CHLORIDE 0.9 % IV SOLN
INTRAVENOUS | Status: AC
  Administered 2015-11-01: 22:00:00 via INTRAVENOUS

## 2015-11-01 MED ORDER — NITROGLYCERIN 1 MG/10 ML FOR IR/CATH LAB
INTRA_ARTERIAL | Status: DC | PRN
  Administered 2015-11-01: 20:00:00

## 2015-11-01 MED ORDER — SODIUM CHLORIDE 0.9 % IV SOLN
250.0000 mg | INTRAVENOUS | Status: DC | PRN
  Administered 2015-11-01: 1.75 mg/kg/h via INTRAVENOUS

## 2015-11-01 MED ORDER — TICAGRELOR 90 MG PO TABS
ORAL_TABLET | ORAL | Status: DC | PRN
  Administered 2015-11-01: 180 mg via ORAL

## 2015-11-01 MED ORDER — ASPIRIN 81 MG PO CHEW: 81.0000 mg | CHEWABLE_TABLET | ORAL | Status: DC

## 2015-11-01 MED ORDER — HEPARIN SODIUM (PORCINE) 1000 UNIT/ML IJ SOLN
INTRAMUSCULAR | Status: AC
  Filled 2015-11-01: qty 1

## 2015-11-01 SURGICAL SUPPLY — 22 items
BALLN EMERGE MR 2.5X15 (BALLOONS) ×2
BALLN ~~LOC~~ EMERGE MR 2.5X8 (BALLOONS) ×2
BALLN ~~LOC~~ EUPHORA RX 3.0X8 (BALLOONS) ×2
BALLOON EMERGE MR 2.5X15 (BALLOONS) IMPLANT
BALLOON ~~LOC~~ EMERGE MR 2.5X8 (BALLOONS) IMPLANT
BALLOON ~~LOC~~ EUPHORA RX 3.0X8 (BALLOONS) IMPLANT
CATH INFINITI 5 FR JL3.5 (CATHETERS) ×2 IMPLANT
CATH INFINITI JR4 5F (CATHETERS) ×2 IMPLANT
CATH LAUNCHER 5F EBU3.0 (CATHETERS) IMPLANT
CATHETER LAUNCHER 5F EBU3.0 (CATHETERS) ×2
DEVICE RAD COMP TR BAND LRG (VASCULAR PRODUCTS) ×2 IMPLANT
GLIDESHEATH SLEND SS 6F .021 (SHEATH) ×2 IMPLANT
KIT ENCORE 26 ADVANTAGE (KITS) ×1 IMPLANT
KIT HEART LEFT (KITS) ×2 IMPLANT
PACK CARDIAC CATHETERIZATION (CUSTOM PROCEDURE TRAY) ×2 IMPLANT
STENT PROMUS PREM MR 2.25X16 (Permanent Stent) ×1 IMPLANT
STENT PROMUS PREM MR 2.75X12 (Permanent Stent) ×1 IMPLANT
TRANSDUCER W/STOPCOCK (MISCELLANEOUS) ×2 IMPLANT
TUBING CIL FLEX 10 FLL-RA (TUBING) ×2 IMPLANT
WIRE COUGAR XT STRL 190CM (WIRE) ×1 IMPLANT
WIRE HI TORQ VERSACORE-J 145CM (WIRE) ×1 IMPLANT
WIRE SAFE-T 1.5MM-J .035X260CM (WIRE) ×2 IMPLANT

## 2015-11-01 NOTE — Progress Notes (Signed)
ANTICOAGULATION CONSULT NOTE - Follow-up Consult  Pharmacy Consult for Heparin Indication: chest pain/ACS  No Known Allergies  Patient Measurements: Height: 4' 11.84" (152 cm) Weight: 211 lb 6.4 oz (95.89 kg) IBW/kg (Calculated) : 45.14 Heparin Dosing Weight: 68 kg  Vital Signs: Temp: 98 F (36.7 C) (03/09 0429) Temp Source: Oral (03/09 0429) BP: 146/51 mmHg (03/09 0429) Pulse Rate: 46 (03/09 0429)  Labs:  Recent Labs  10/31/15 1330 10/31/15 2222 11/01/15 0206 11/01/15 0222  HGB 12.9  --   --   --   HCT 38.3  --   --   --   PLT 246  --   --   --   HEPARINUNFRC  --   --   --  0.62  CREATININE 1.04*  --   --   --   TROPONINI  --  <0.03 <0.03  --     Estimated Creatinine Clearance: 57.2 mL/min (by C-G formula based on Cr of 1.04).  Assessment: 64yo female on heparin for r/o ACS. Heparin level therapeutic on 850 units/hr. No bleeding noted. Plan for stress test today. Chest CT negative for PE.   Goal of Therapy:  Heparin level 0.3-0.7 units/ml Monitor platelets by anticoagulation protocol: Yes   Plan:  Continue heparin 850 units/hr F/u 6 hr confirmatory level  Christoper Fabianaron Clorine Swing, PharmD, BCPS Clinical pharmacist, pager [phone] 11/01/2015,4:51 AM

## 2015-11-01 NOTE — Progress Notes (Signed)
ANTICOAGULATION CONSULT NOTE - Follow-up Consult  Pharmacy Consult for Heparin Indication: chest pain/ACS  No Known Allergies  Patient Measurements: Height: 4' 11.84" (152 cm) Weight: 211 lb 6.4 oz (95.89 kg) IBW/kg (Calculated) : 45.14 Heparin Dosing Weight: 68 kg  Vital Signs: Temp: 98 F (36.7 C) (03/09 0429) Temp Source: Oral (03/09 0429) BP: 146/51 mmHg (03/09 0429) Pulse Rate: 46 (03/09 0429)  Labs:  Recent Labs  10/31/15 1330 10/31/15 2222 11/01/15 0206 11/01/15 0222  HGB 12.9  --   --   --   HCT 38.3  --   --   --   PLT 246  --   --   --   HEPARINUNFRC  --   --   --  0.62  CREATININE 1.04*  --   --   --   TROPONINI  --  <0.03 <0.03  --     Estimated Creatinine Clearance: 57.2 mL/min (by C-G formula based on Cr of 1.04).  Assessment: 64yo female on heparin for r/o ACS. Heparin level therapeutic on 850 units/hr. No bleeding noted. Plan for cath today. Chest CT negative for PE. CBC wnl. HL therapeutic x2 (0.5) on 850 units/h. No bleed.  Goal of Therapy:  Heparin level 0.3-0.7 units/ml Monitor platelets by anticoagulation protocol: Yes   Plan:  Continue heparin 850 units/h Daily HL/CBC Monitor s/sx bleeding Cath today  Babs BertinHaley Rayan Ines, PharmD, [facility] HospitalBCPS Clinical Pharmacist Pager [phone] 11/01/2015 9:45 AM

## 2015-11-01 NOTE — Progress Notes (Signed)
*  PRELIMINARY RESULTS* Echocardiogram 2D Echocardiogram has been performed.  Jeryl Columbialliott, Sharicka Pogorzelski 11/01/2015, 10:23 AM

## 2015-11-01 NOTE — H&P (View-Only) (Signed)
 SUBJECTIVE:  No further CP  OBJECTIVE:   Vitals:   Filed Vitals:   10/31/15 2045 10/31/15 2100 10/31/15 2144 11/01/15 0429  BP: 126/65 140/67 153/49 146/51  Pulse: 49 47 50 46  Temp:   97.9 F (36.6 C) 98 F (36.7 C)  TempSrc:   Oral Oral  Resp: 21 12 18 18  Height:   4' 11.84" (1.52 m)   Weight:   211 lb 6.4 oz (95.89 kg)   SpO2: 96% 98% 100% 99%   I&O's:  No intake or output data in the 24 hours ending 11/01/15 0822 TELEMETRY: Reviewed telemetry pt in NSR:     PHYSICAL EXAM General: Well developed, well nourished, in no acute distress Head: Eyes PERRLA, No xanthomas.   Normal cephalic and atramatic  Lungs:   Clear bilaterally to auscultation and percussion. Heart:   HRRR S1 S2 Pulses are 2+ & equal. Abdomen: Bowel sounds are positive, abdomen soft and non-tender without masses\ Extremities:   No clubbing, cyanosis or edema.  DP +1 Neuro: Alert and oriented X 3. Psych:  Good affect, responds appropriately   LABS: Basic Metabolic Panel:  Recent Labs  10/31/15 1330  NA 137  K 4.0  CL 104  CO2 22  GLUCOSE 114*  BUN 16  CREATININE 1.04*  CALCIUM 9.3   Liver Function Tests: No results for input(s): AST, ALT, ALKPHOS, BILITOT, PROT, ALBUMIN in the last 72 hours. No results for input(s): LIPASE, AMYLASE in the last 72 hours. CBC:  Recent Labs  10/31/15 1330  WBC 6.4  HGB 12.9  HCT 38.3  MCV 96.0  PLT 246   Cardiac Enzymes:  Recent Labs  10/31/15 2222 11/01/15 0206  TROPONINI <0.03 <0.03   BNP: Invalid input(s): POCBNP D-Dimer:  Recent Labs  10/31/15 1720  DDIMER 0.83*   Hemoglobin A1C: No results for input(s): HGBA1C in the last 72 hours. Fasting Lipid Panel:  Recent Labs  11/01/15 0222  CHOL 195  HDL 53  LDLCALC 126*  TRIG 81  CHOLHDL 3.7   Thyroid Function Tests:  Recent Labs  10/31/15 2222  TSH 3.229   Anemia Panel: No results for input(s): VITAMINB12, FOLATE, FERRITIN, TIBC, IRON, RETICCTPCT in the last 72  hours. Coag Panel:   No results found for: INR, PROTIME  RADIOLOGY: Dg Chest 2 View  10/31/2015  CLINICAL DATA:  Medial chest pain, pain down LEFT arm, across shoulder and into LEFT neck beginning yesterday; shortness of breath, cough, hypertension EXAM: CHEST  2 VIEW COMPARISON:  08/29/2009 FINDINGS: Mild enlargement of cardiac silhouette. Mediastinal contours and pulmonary vascularity normal. Lungs clear. No pleural effusion or pneumothorax. Mild endplate spur formation thoracic spine. IMPRESSION: Mild enlargement of cardiac silhouette. No acute abnormalities. Electronically Signed   By: Mark  Boles M.D.   On: 10/31/2015 14:06   Ct Angio Chest Pe W/cm &/or Wo Cm  10/31/2015  CLINICAL DATA:  Worsening chest pain and tightness which started about a month ago. Pain occurs with exertion. Associated shortness of breath, nausea, warm feeling, and neck pain. EXAM: CT ANGIOGRAPHY CHEST WITH CONTRAST TECHNIQUE: Multidetector CT imaging of the chest was performed using the standard protocol during bolus administration of intravenous contrast. Multiplanar CT image reconstructions and MIPs were obtained to evaluate the vascular anatomy. CONTRAST:  100mL OMNIPAQUE IOHEXOL 350 MG/ML SOLN COMPARISON:  08/30/2009 FINDINGS: Technically adequate study with good opacification of the central and segmental pulmonary arteries. No focal filling defects identified. No evidence of significant pulmonary embolus. Normal heart size. Normal   caliber thoracic aorta. No aortic dissection. Great vessel origins are patent. Esophagus is decompressed. Small esophageal hiatal hernia. No significant lymphadenopathy in the chest. Coronary artery calcifications. Dependent atelectasis in the lungs. Evaluation of lungs is limited due to respiratory motion artifact but no focal consolidation or airspace disease is suggested. No pleural effusions. No pneumothorax. Airways appear patent. Included portions of the upper abdominal organs demonstrate  calcified granulomas in the spleen. Degenerative changes in the spine. No destructive bone lesions. Review of the MIP images confirms the above findings. IMPRESSION: No evidence of significant pulmonary embolus. No evidence of active pulmonary disease. Coronary artery calcifications. Electronically Signed   By: William  Stevens M.D.   On: 10/31/2015 23:08    ASSESSMENT/PLAN: 1. Crescendo angina - this has been occurring more frequently over the past month and is very concerning for coronary ischemia. Pain occurs with exertion and rest and radiates into her neck and arms. EKG shows nonspecific ST abnormality. Cardiac enzymes negative.  CRFs include HTN. Her symptoms are very concerning for coronary ischemia but her D-Dimer is also elevated but chest CT showed coronary artery calcifications. Patient is NPO. Continue ASA/IV Heparin gtt/statin. No BB secondary to bradycardia. Given symptoms and evidence of coronary artery calcifications recommend proceeding with cardiac catheterization.  Cardiac catheterization was discussed with the patient fully. The patient understands that risks include but are not limited to stroke (1 in 1000), death (1 in 1000), kidney failure [usually temporary] (1 in 500), bleeding (1 in 200), allergic reaction [possibly serious] (1 in 200).  The patient understands and is willing to proceed.   2. HTN - borderline controlled. Continue ACE I. Titrate amlodipine as needed   3. Bradycardia - she denies any weakness or fatigue but her CP may be due to low CO from significant bradycardia. Ziac stopped. TSH is normal.     Huntley Knoop R, MD  11/01/2015  8:22 AM  

## 2015-11-01 NOTE — Progress Notes (Signed)
PATIENT DETAILS Name: Tonya Bryant Age: 65 y.o. Sex: female Date of Birth: [birth year] Admit Date: 10/31/2015 Admitting Physician Dewayne Shorter Levora Dredge, MD BJY:NWGNFAO,ZHYQMV R, MD  Subjective: No chest pain this am  Assessment/Plan: Principal Problem: Chest pain:Suspected unstable angina, trop/ekg negative. Seen by cards-plan is for [facility] today  Active Problems: Sinus Bradycardia:assymptomatic-continue to hold beta blockers-TSH normal.  HQI:[id number] with Lisinopril and Amlodipine.  Obesity: Will need ongoing counseling regarding importance of weight loss.  Disposition: Remain inpatient  Antimicrobial agents  See below  Anti-infectives    None      DVT Prophylaxis: IV Heparin   Code Status: Full code   Family Communication None at bedside  Procedures: None  CONSULTS:  cardiology  Time spent 25 minutes-Greater than 50% of this time was spent in counseling, explanation of diagnosis, planning of further management, and coordination of care.  MEDICATIONS: Scheduled Meds: . amLODipine  5 mg Oral Daily  . [START ON 11/02/2015] aspirin  81 mg Oral Pre-Cath  . aspirin EC  325 mg Oral Daily  . aspirin EC  81 mg Oral Daily  . atorvastatin  80 mg Oral q1800  . citalopram  40 mg Oral Daily  . lisinopril  20 mg Oral Daily  . pantoprazole  40 mg Oral Q1200  . sodium chloride flush  3 mL Intravenous Q12H   Continuous Infusions: . sodium chloride 1 mL/kg/hr (11/01/15 0943)  . heparin 850 Units/hr (10/31/15 2300)   PRN Meds:.sodium chloride, acetaminophen, gi cocktail, morphine injection, nitroGLYCERIN, ondansetron (ZOFRAN) IV, sodium chloride flush    PHYSICAL EXAM: Vital signs in last 24 hours: Filed Vitals:   10/31/15 2045 10/31/15 2100 10/31/15 2144 11/01/15 0429  BP: 126/65 140/67 153/49 146/51  Pulse: 49 47 50 46  Temp:   97.9 F (36.6 C) 98 F (36.7 C)  TempSrc:   Oral Oral  Resp: Height:   4' 11.84" (1.52 m)     Weight:   95.89 kg (211 lb 6.4 oz)   SpO2: 96% 98% 100% 99%    Weight change:  Filed Weights   10/31/15 2144  Weight: 95.89 kg (211 lb 6.4 oz)   Body mass index is 41.5 kg/(m^2).   Gen Exam: Awake and alert with clear speech.   Neck: Supple, No JVD.   Chest: B/L Clear.   CVS: S1 S2 Regular, no murmurs.  Abdomen: soft, BS +, non tender, non distended.  Extremities: no edema, lower extremities warm to touch. Neurologic: Non Focal.   Skin: No Rash.   Wounds: N/A.   Intake/Output from previous day: No intake or output data in the 24 hours ending 11/01/15 1231   LAB RESULTS: CBC  Recent Labs Lab 10/31/15 1330 11/01/15 0930  WBC 6.4 7.1  HGB 12.9 12.7  HCT 38.3 37.9  PLT 246 233  MCV 96.0 95.7  MCH 32.3 32.1  MCHC 33.7 33.5  RDW 12.8 12.8    Chemistries   Recent Labs Lab 10/31/15 1330 11/01/15 0930  NA 137 140  K 4.0 3.9  CL 104 106  CO2 22 24  GLUCOSE 114* 110*  BUN 16 13  CREATININE 1.04* 1.04*  CALCIUM 9.3 9.0    CBG: No results for input(s): GLUCAP in the last 168 hours.  GFR Estimated Creatinine Clearance: 57.2 mL/min (by C-G formula based on Cr of 1.04).  Coagulation profile No results for input(s): INR, PROTIME in  the last 168 hours.  Cardiac Enzymes  Recent Labs Lab 10/31/15 2222 11/01/15 0206  TROPONINI <0.03 <0.03    Invalid input(s): POCBNP  Recent Labs  10/31/15 1720  DDIMER 0.83*   No results for input(s): HGBA1C in the last 72 hours.  Recent Labs  11/01/15 0203 11/01/15 0222  CHOL 194 195  HDL 54 53  LDLCALC 126* 126*  TRIG 68 81  CHOLHDL 3.6 3.7    Recent Labs  10/31/15 2222  TSH 3.229   No results for input(s): VITAMINB12, FOLATE, FERRITIN, TIBC, IRON, RETICCTPCT in the last 72 hours. No results for input(s): LIPASE, AMYLASE in the last 72 hours.  Urine Studies No results for input(s): UHGB, CRYS in the last 72 hours.  Invalid input(s): UACOL, UAPR, USPG, UPH, UTP, UGL, UKET, UBIL, UNIT, UROB, ULEU,  UEPI, UWBC, URBC, UBAC, CAST, UCOM, BILUA  MICROBIOLOGY: No results found for this or any previous visit (from the past 240 hour(s)).  RADIOLOGY STUDIES/RESULTS: Dg Chest 2 View  10/31/2015  CLINICAL DATA:  Medial chest pain, pain down LEFT arm, across shoulder and into LEFT neck beginning yesterday; shortness of breath, cough, hypertension EXAM: CHEST  2 VIEW COMPARISON:  08/29/2009 FINDINGS: Mild enlargement of cardiac silhouette. Mediastinal contours and pulmonary vascularity normal. Lungs clear. No pleural effusion or pneumothorax. Mild endplate spur formation thoracic spine. IMPRESSION: Mild enlargement of cardiac silhouette. No acute abnormalities. Electronically Signed   By: Ulyses SouthwardMark  Boles M.D.   On: 10/31/2015 14:06   Ct Angio Chest Pe W/cm &/or Wo Cm  10/31/2015  CLINICAL DATA:  Worsening chest pain and tightness which started about a month ago. Pain occurs with exertion. Associated shortness of breath, nausea, warm feeling, and neck pain. EXAM: CT ANGIOGRAPHY CHEST WITH CONTRAST TECHNIQUE: Multidetector CT imaging of the chest was performed using the standard protocol during bolus administration of intravenous contrast. Multiplanar CT image reconstructions and MIPs were obtained to evaluate the vascular anatomy. CONTRAST:  100mL OMNIPAQUE IOHEXOL 350 MG/ML SOLN COMPARISON:  08/30/2009 FINDINGS: Technically adequate study with good opacification of the central and segmental pulmonary arteries. No focal filling defects identified. No evidence of significant pulmonary embolus. Normal heart size. Normal caliber thoracic aorta. No aortic dissection. Great vessel origins are patent. Esophagus is decompressed. Small esophageal hiatal hernia. No significant lymphadenopathy in the chest. Coronary artery calcifications. Dependent atelectasis in the lungs. Evaluation of lungs is limited due to respiratory motion artifact but no focal consolidation or airspace disease is suggested. No pleural effusions. No  pneumothorax. Airways appear patent. Included portions of the upper abdominal organs demonstrate calcified granulomas in the spleen. Degenerative changes in the spine. No destructive bone lesions. Review of the MIP images confirms the above findings. IMPRESSION: No evidence of significant pulmonary embolus. No evidence of active pulmonary disease. Coronary artery calcifications. Electronically Signed   By: Burman NievesWilliam  Stevens M.D.   On: 10/31/2015 23:08    Jeoffrey MassedGHIMIRE,SHANKER, MD  [facility] Pager:[phone]  If 7PM-7AM, please contact night-coverage [email or web address] Password [facility] [email or web address] 11/01/2015, 12:31 PM

## 2015-11-01 NOTE — Progress Notes (Signed)
SUBJECTIVE:  No further CP  OBJECTIVE:   Vitals:   Filed Vitals:   10/31/15 2045 10/31/15 2100 10/31/15 2144 11/01/15 0429  BP: 126/65 140/67 153/49 146/51  Pulse: 49 47 50 46  Temp:   97.9 F (36.6 C) 98 F (36.7 C)  TempSrc:   Oral Oral  Resp: Height:   4' 11.84" (1.52 m)   Weight:   211 lb 6.4 oz (95.89 kg)   SpO2: 96% 98% 100% 99%   I&O's:  No intake or output data in the 24 hours ending 11/01/15 1610 TELEMETRY: Reviewed telemetry pt in NSR:     PHYSICAL EXAM General: Well developed, well nourished, in no acute distress Head: Eyes PERRLA, No xanthomas.   Normal cephalic and atramatic  Lungs:   Clear bilaterally to auscultation and percussion. Heart:   HRRR S1 S2 Pulses are 2+ & equal. Abdomen: Bowel sounds are positive, abdomen soft and non-tender without masses\ Extremities:   No clubbing, cyanosis or edema.  DP +1 Neuro: Alert and oriented X 3. Psych:  Good affect, responds appropriately   LABS: Basic Metabolic Panel:  Recent Labs  96/04/54 1330  NA 137  K 4.0  CL 104  CO2 22  GLUCOSE 114*  BUN 16  CREATININE 1.04*  CALCIUM 9.3   Liver Function Tests: No results for input(s): AST, ALT, ALKPHOS, BILITOT, PROT, ALBUMIN in the last 72 hours. No results for input(s): LIPASE, AMYLASE in the last 72 hours. CBC:  Recent Labs  10/31/15 1330  WBC 6.4  HGB 12.9  HCT 38.3  MCV 96.0  PLT 246   Cardiac Enzymes:  Recent Labs  10/31/15 2222 11/01/15 0206  TROPONINI <0.03 <0.03   BNP: Invalid input(s): POCBNP D-Dimer:  Recent Labs  10/31/15 1720  DDIMER 0.83*   Hemoglobin A1C: No results for input(s): HGBA1C in the last 72 hours. Fasting Lipid Panel:  Recent Labs  11/01/15 0222  CHOL 195  HDL 53  LDLCALC 126*  TRIG 81  CHOLHDL 3.7   Thyroid Function Tests:  Recent Labs  10/31/15 2222  TSH 3.229   Anemia Panel: No results for input(s): VITAMINB12, FOLATE, FERRITIN, TIBC, IRON, RETICCTPCT in the last 72  hours. Coag Panel:   No results found for: INR, PROTIME  RADIOLOGY: Dg Chest 2 View  10/31/2015  CLINICAL DATA:  Medial chest pain, pain down LEFT arm, across shoulder and into LEFT neck beginning yesterday; shortness of breath, cough, hypertension EXAM: CHEST  2 VIEW COMPARISON:  08/29/2009 FINDINGS: Mild enlargement of cardiac silhouette. Mediastinal contours and pulmonary vascularity normal. Lungs clear. No pleural effusion or pneumothorax. Mild endplate spur formation thoracic spine. IMPRESSION: Mild enlargement of cardiac silhouette. No acute abnormalities. Electronically Signed   By: Ulyses Southward M.D.   On: 10/31/2015 14:06   Ct Angio Chest Pe W/cm &/or Wo Cm  10/31/2015  CLINICAL DATA:  Worsening chest pain and tightness which started about a month ago. Pain occurs with exertion. Associated shortness of breath, nausea, warm feeling, and neck pain. EXAM: CT ANGIOGRAPHY CHEST WITH CONTRAST TECHNIQUE: Multidetector CT imaging of the chest was performed using the standard protocol during bolus administration of intravenous contrast. Multiplanar CT image reconstructions and MIPs were obtained to evaluate the vascular anatomy. CONTRAST:  OMNIPAQUE IOHEXOL 350 MG/ML SOLN COMPARISON:  08/30/2009 FINDINGS: Technically adequate study with good opacification of the central and segmental pulmonary arteries. No focal filling defects identified. No evidence of significant pulmonary embolus. Normal heart size. Normal  caliber thoracic aorta. No aortic dissection. Great vessel origins are patent. Esophagus is decompressed. Small esophageal hiatal hernia. No significant lymphadenopathy in the chest. Coronary artery calcifications. Dependent atelectasis in the lungs. Evaluation of lungs is limited due to respiratory motion artifact but no focal consolidation or airspace disease is suggested. No pleural effusions. No pneumothorax. Airways appear patent. Included portions of the upper abdominal organs demonstrate  calcified granulomas in the spleen. Degenerative changes in the spine. No destructive bone lesions. Review of the MIP images confirms the above findings. IMPRESSION: No evidence of significant pulmonary embolus. No evidence of active pulmonary disease. Coronary artery calcifications. Electronically Signed   By: Burman NievesWilliam  Stevens M.D.   On: 10/31/2015 23:08    ASSESSMENT/PLAN: 1. Crescendo angina - this has been occurring more frequently over the past month and is very concerning for coronary ischemia. Pain occurs with exertion and rest and radiates into her neck and arms. EKG shows nonspecific ST abnormality. Cardiac enzymes negative.  CRFs include HTN. Her symptoms are very concerning for coronary ischemia but her D-Dimer is also elevated but chest CT showed coronary artery calcifications. Patient is NPO. Continue ASA/IV Heparin gtt/statin. No BB secondary to bradycardia. Given symptoms and evidence of coronary artery calcifications recommend proceeding with cardiac catheterization.  Cardiac catheterization was discussed with the patient fully. The patient understands that risks include but are not limited to stroke (1 in 1000), death (1 in 1000), kidney failure [usually temporary] (1 in 500), bleeding (1 in 200), allergic reaction [possibly serious] (1 in 200).  The patient understands and is willing to proceed.   2. HTN - borderline controlled. Continue ACE I. Titrate amlodipine as needed   3. Bradycardia - she denies any weakness or fatigue but her CP may be due to low CO from significant bradycardia. Ziac stopped. TSH is normal.     Quintella ReichertURNER,Aubrey Blackard R, MD  11/01/2015  8:22 AM

## 2015-11-01 NOTE — Progress Notes (Signed)
Call from cath lab - stopped IV Heparin. Stated/ reminded without 2nd IV site - unable to start. IV team has been called/ has not arrived.

## 2015-11-01 NOTE — Research (Signed)
CADLAD Informed Consent   Subject Name: Natalyia J Martinique  Subject met inclusion and exclusion criteria.  The informed consent form, study requirements and expectations were reviewed with the subject and questions and concerns were addressed prior to the signing of the consent form.  The subject verbalized understanding of the trail requirements.  The subject agreed to participate in the CADLAD trial and signed the informed consent.  The informed consent was obtained prior to performance of any protocol-specific procedures for the subject.  A copy of the signed informed consent was given to the subject and a copy was placed in the subject's medical record.  Berneda Rose 11/01/2015, 3:07 PM

## 2015-11-01 NOTE — Interval H&P Note (Signed)
History and Physical Interval Note:  11/01/2015 7:05 PM  Tonya Bryant  has presented today for surgery, with the diagnosis of cp  The various methods of treatment have been discussed with the patient and family. After consideration of risks, benefits and other options for treatment, the patient has consented to  Procedure(s): Left Heart Cath and Coronary Angiography (N/A) as a surgical intervention .  The patient's history has been reviewed, patient examined, no change in status, stable for surgery.  I have reviewed the patient's chart and labs.  Questions were answered to the patient's satisfaction.     Tonny Bollmanooper, Akeema Broder

## 2015-11-02 ENCOUNTER — Encounter (HOSPITAL_COMMUNITY): Payer: Self-pay | Admitting: Cardiovascular Disease

## 2015-11-02 DIAGNOSIS — I25110 Atherosclerotic heart disease of native coronary artery with unstable angina pectoris: Principal | ICD-10-CM

## 2015-11-02 DIAGNOSIS — I200 Unstable angina: Secondary | ICD-10-CM

## 2015-11-02 DIAGNOSIS — I209 Angina pectoris, unspecified: Secondary | ICD-10-CM

## 2015-11-02 DIAGNOSIS — I25119 Atherosclerotic heart disease of native coronary artery with unspecified angina pectoris: Secondary | ICD-10-CM

## 2015-11-02 DIAGNOSIS — I2510 Atherosclerotic heart disease of native coronary artery without angina pectoris: Secondary | ICD-10-CM

## 2015-11-02 HISTORY — DX: Atherosclerotic heart disease of native coronary artery without angina pectoris: I25.10

## 2015-11-02 LAB — CBC
HEMATOCRIT: 37.4 % (ref 36.0–46.0)
HEMOGLOBIN: 12.2 g/dL (ref 12.0–15.0)
MCH: 31.2 pg (ref 26.0–34.0)
MCHC: 32.6 g/dL (ref 30.0–36.0)
MCV: 95.7 fL (ref 78.0–100.0)
Platelets: 251 10*3/uL (ref 150–400)
RBC: 3.91 MIL/uL (ref 3.87–5.11)
RDW: 13 % (ref 11.5–15.5)
WBC: 8.1 10*3/uL (ref 4.0–10.5)

## 2015-11-02 LAB — BASIC METABOLIC PANEL
ANION GAP: 9 (ref 5–15)
BUN: 7 mg/dL (ref 6–20)
CHLORIDE: 105 mmol/L (ref 101–111)
CO2: 23 mmol/L (ref 22–32)
Calcium: 9 mg/dL (ref 8.9–10.3)
Creatinine, Ser: 0.88 mg/dL (ref 0.44–1.00)
GFR calc non Af Amer: >60 mL/min (ref >60)
Glucose, Bld: 108 mg/dL — ABNORMAL HIGH (ref 65–99)
POTASSIUM: 3.6 mmol/L (ref 3.5–5.1)
SODIUM: 137 mmol/L (ref 135–145)

## 2015-11-02 LAB — HEMOGLOBIN A1C
Hgb A1c MFr Bld: 5.8 % — ABNORMAL HIGH (ref 4.8–5.6)
Mean Plasma Glucose: 120 mg/dL

## 2015-11-02 MED ORDER — TICAGRELOR 90 MG PO TABS: 90.0000 mg | ORAL_TABLET | Freq: Two times a day (BID) | ORAL | Status: DC

## 2015-11-02 MED ORDER — LISINOPRIL 40 MG PO TABS: 40.0000 mg | ORAL_TABLET | Freq: Every day | ORAL | Status: DC

## 2015-11-02 MED ORDER — PANTOPRAZOLE SODIUM 40 MG PO TBEC: 40.0000 mg | DELAYED_RELEASE_TABLET | Freq: Every day | ORAL | Status: DC

## 2015-11-02 MED ORDER — LISINOPRIL 10 MG PO TABS
20.0000 mg | ORAL_TABLET | Freq: Every day | ORAL | Status: DC
  Administered 2015-11-02: 10:00:00 20 mg via ORAL
  Filled 2015-11-02 (×2): qty 2

## 2015-11-02 MED ORDER — NITROGLYCERIN 0.4 MG SL SUBL: 0.4000 mg | SUBLINGUAL_TABLET | SUBLINGUAL | Status: DC | PRN

## 2015-11-02 MED ORDER — CITALOPRAM HYDROBROMIDE 40 MG PO TABS: 20.0000 mg | ORAL_TABLET | Freq: Every day | ORAL | Status: DC

## 2015-11-02 MED ORDER — ATORVASTATIN CALCIUM 80 MG PO TABS: 80.0000 mg | ORAL_TABLET | Freq: Every day | ORAL | Status: DC

## 2015-11-02 MED ORDER — AMLODIPINE BESYLATE 10 MG PO TABS: 10.0000 mg | ORAL_TABLET | Freq: Every day | ORAL | Status: DC

## 2015-11-02 MED ORDER — LISINOPRIL 20 MG PO TABS: 20.0000 mg | ORAL_TABLET | Freq: Every day | ORAL | Status: DC

## 2015-11-02 MED ORDER — AMLODIPINE BESYLATE 10 MG PO TABS
10.0000 mg | ORAL_TABLET | Freq: Every day | ORAL | Status: DC
Start: 2015-11-02 — End: 2018-06-15

## 2015-11-02 MED ORDER — AMLODIPINE BESYLATE 10 MG PO TABS
10.0000 mg | ORAL_TABLET | Freq: Every day | ORAL | Status: DC
  Administered 2015-11-02: 08:00:00 10 mg via ORAL
  Filled 2015-11-02: qty 1

## 2015-11-02 MED ORDER — ASPIRIN 81 MG PO TABS: 81.0000 mg | ORAL_TABLET | Freq: Every day | ORAL | Status: AC

## 2015-11-02 MED ORDER — ANGIOPLASTY BOOK
Freq: Once | Status: DC
  Filled 2015-11-02: qty 1

## 2015-11-02 NOTE — Discharge Summary (Signed)
PATIENT DETAILS Name: Tonya Bryant Age: 65 y.o. Sex: female Date of Birth: 1950-11-13 MRN: [id number]. Admitting Physician: Maretta Bees, MD NWG:[id number],[id number] R, MD  Admit Date: 10/31/2015 Discharge date: 11/02/2015  Recommendations for Outpatient Follow-up:  1. Ensure follow up with cardiology 2. No Beta Blockers-bradycardia with a heart rate in the 40s on initial presentation.  3. New medications: Aspirin, Brilinta, statin, amlodipine. 4. Please repeat CBC/BMET at next visit  PRIMARY DISCHARGE DIAGNOSIS:  Principal Problem:   Chest pain Active Problems:   Exertional chest pain   HTN (hypertension)   Bradycardia   Obesity   Unstable angina pectoris (HCC)   CAD (coronary artery disease), native coronary artery      PAST MEDICAL HISTORY: Past Medical History  Diagnosis Date  . Hypertension   . Anxiety   . Depression (emotion)   . Full dentures   . CAD (coronary artery disease), native coronary artery 11/02/2015    Ost RCA lesion, 30% stenosed, Prox LAD lesion, 50% stenosed, 1st Diag lesion, 65% stenosed, Ramus lesion, 50% stenosed, Mid Cx lesion, 80% stenosed, Dist LAD lesion, 75% stenosed. S/P DES to distal LAD and mid Cx 10/2015    DISCHARGE MEDICATIONS: Current Discharge Medication List    START taking these medications   Details  amLODipine (NORVASC) 10 MG tablet Take 1 tablet (10 mg total) by mouth daily. Qty: 90 tablet, Refills: 0    atorvastatin (LIPITOR) 80 MG tablet Take 1 tablet (80 mg total) by mouth daily at 6 PM. Qty: 90 tablet, Refills: 0    lisinopril (PRINIVIL,ZESTRIL) 20 MG tablet Take 1 tablet (20 mg total) by mouth daily. Qty: 30 tablet, Refills: 0    pantoprazole (PROTONIX) 40 MG tablet Take 1 tablet (40 mg total) by mouth daily at 12 noon. Qty: 90 tablet, Refills: 0    ticagrelor (BRILINTA) 90 MG TABS tablet Take 1 tablet (90 mg total) by mouth 2 (two) times daily. Qty: 180 tablet, Refills: 0      CONTINUE these medications which  have CHANGED   Details  aspirin 81 MG tablet Take 1 tablet (81 mg total) by mouth daily. Qty: 90 tablet, Refills: 0    citalopram (CELEXA) 40 MG tablet Take 0.5 tablets (20 mg total) by mouth daily.      CONTINUE these medications which have NOT CHANGED   Details  diclofenac (VOLTAREN) 75 MG EC tablet Take 1 tablet (75 mg total) by mouth 2 (two) times daily. Qty: 50 tablet, Refills: 2      STOP taking these medications     bisoprolol-hydrochlorothiazide (ZIAC) 10-6.25 MG tablet      lisinopril-hydrochlorothiazide (PRINZIDE,ZESTORETIC) 20-25 MG tablet         ALLERGIES:  No Known Allergies  BRIEF HPI:  See H&P, Labs, Consult and Test reports for all details in brief, patient was admitted for evaluation of chest pain.  CONSULTATIONS:   cardiology  PERTINENT RADIOLOGIC STUDIES: Dg Chest 2 View  10/31/2015  CLINICAL DATA:  Medial chest pain, pain down LEFT arm, across shoulder and into LEFT neck beginning yesterday; shortness of breath, cough, hypertension EXAM: CHEST  2 VIEW COMPARISON:  08/29/2009 FINDINGS: Mild enlargement of cardiac silhouette. Mediastinal contours and pulmonary vascularity normal. Lungs clear. No pleural effusion or pneumothorax. Mild endplate spur formation thoracic spine. IMPRESSION: Mild enlargement of cardiac silhouette. No acute abnormalities. Electronically Signed   By: Ulyses Southward M.D.   On: 10/31/2015 14:06   Ct Angio Chest Pe W/cm &/or Wo Cm  10/31/2015  CLINICAL DATA:  Worsening chest pain and tightness which started about a month ago. Pain occurs with exertion. Associated shortness of breath, nausea, warm feeling, and neck pain. EXAM: CT ANGIOGRAPHY CHEST WITH CONTRAST TECHNIQUE: Multidetector CT imaging of the chest was performed using the standard protocol during bolus administration of intravenous contrast. Multiplanar CT image reconstructions and MIPs were obtained to evaluate the vascular anatomy. CONTRAST:  100mL OMNIPAQUE IOHEXOL 350 MG/ML SOLN  COMPARISON:  08/30/2009 FINDINGS: Technically adequate study with good opacification of the central and segmental pulmonary arteries. No focal filling defects identified. No evidence of significant pulmonary embolus. Normal heart size. Normal caliber thoracic aorta. No aortic dissection. Great vessel origins are patent. Esophagus is decompressed. Small esophageal hiatal hernia. No significant lymphadenopathy in the chest. Coronary artery calcifications. Dependent atelectasis in the lungs. Evaluation of lungs is limited due to respiratory motion artifact but no focal consolidation or airspace disease is suggested. No pleural effusions. No pneumothorax. Airways appear patent. Included portions of the upper abdominal organs demonstrate calcified granulomas in the spleen. Degenerative changes in the spine. No destructive bone lesions. Review of the MIP images confirms the above findings. IMPRESSION: No evidence of significant pulmonary embolus. No evidence of active pulmonary disease. Coronary artery calcifications. Electronically Signed   By: Burman NievesWilliam  Stevens M.D.   On: 10/31/2015 23:08     PERTINENT LAB RESULTS: CBC:  Recent Labs  11/01/15 0930 11/02/15 0440  WBC 7.1 8.1  HGB 12.7 12.2  HCT 37.9 37.4  PLT 233 251   CMET CMP     Component Value Date/Time   NA 137 11/02/2015 0440   K 3.6 11/02/2015 0440   CL 105 11/02/2015 0440   CO2 23 11/02/2015 0440   GLUCOSE 108* 11/02/2015 0440   BUN 7 11/02/2015 0440   CREATININE 0.88 11/02/2015 0440   CALCIUM 9.0 11/02/2015 0440   PROT 7.0 08/30/2009 0545   ALBUMIN 3.7 08/30/2009 0545   AST 30 08/30/2009 0545   ALT 33 08/30/2009 0545   ALKPHOS 60 08/30/2009 0545   BILITOT 0.7 08/30/2009 0545   GFRNONAA >60 11/02/2015 0440   GFRAA >60 11/02/2015 0440    GFR Estimated Creatinine Clearance: 68.1 mL/min (by C-G formula based on Cr of 0.88). No results for input(s): LIPASE, AMYLASE in the last 72 hours.  Recent Labs  10/31/15 2222  11/01/15 0206  TROPONINI <0.03 <0.03   Invalid input(s): POCBNP  Recent Labs  10/31/15 1720  DDIMER 0.83*    Recent Labs  10/31/15 2222  HGBA1C 5.8*    Recent Labs  11/01/15 0203 11/01/15 0222  CHOL 194 195  HDL 54 53  LDLCALC 126* 126*  TRIG 68 81  CHOLHDL 3.6 3.7    Recent Labs  10/31/15 2222  TSH 3.229   No results for input(s): VITAMINB12, FOLATE, FERRITIN, TIBC, IRON, RETICCTPCT in the last 72 hours. Coags:  Recent Labs  11/01/15 0930  INR 1.04   Microbiology: No results found for this or any previous visit (from the past 240 hour(s)).   BRIEF HOSPITAL COURSE:  Unstable angina: Admitted with chest pain-very typical. Cardiology consulted, underwent left heart catheterization on 3/9-and is now status post PCI of the distal LAD and left circumflex. No further chest pain. Doing well. Recommendations are to continue with aspirin, brilinta-statin on discharge. No beta blocker as patient has significant bradycardia with beta blocker in the past. Per cardiology, if she has recurrent anginal symptoms, may need a PCI of her diagonal. Cardiology will arrange for outpatient follow-up.  Sinus Bradycardia:assymptomatic-continue to hold beta blockers-TSH normal.  ZOX:[id number] with Lisinopril and Amlodipine.  Obesity: Will need ongoing counseling regarding importance of weight loss.  TODAY-DAY OF DISCHARGE:  Subjective:   Tonya Bryant today has no headache,no chest abdominal pain,no new weakness tingling or numbness, feels much better wants to go home today.   Objective:   Blood pressure 162/81, pulse 57, temperature 97 F (36.1 C), temperature source Oral, resp. rate 19, height 4' 11.84" (1.52 m), weight 97.1 kg (214 lb 1.1 oz), SpO2 99 %.  Intake/Output Summary (Last 24 hours) at 11/02/15 0956 Last data filed at 11/02/15 0752  Gross per 24 hour  Intake    470 ml  Output   1300 ml  Net   -830 ml   Filed Weights   10/31/15 2144 11/02/15 0410  Weight:  95.89 kg (211 lb 6.4 oz) 97.1 kg (214 lb 1.1 oz)    Exam Awake Alert, Oriented *3, No new F.N deficits, Normal affect Export.AT,PERRAL Supple Neck,No JVD, No cervical lymphadenopathy appriciated.  Symmetrical Chest wall movement, Good air movement bilaterally, CTAB RRR,No Gallops,Rubs or new Murmurs, No Parasternal Heave +ve B.Sounds, Abd Soft, Non tender, No organomegaly appriciated, No rebound -guarding or rigidity. No Cyanosis, Clubbing or edema, No new Rash or bruise  DISCHARGE CONDITION: Stable  DISPOSITION: Home  DISCHARGE INSTRUCTIONS:    Activity:  As tolerated   Get Medicines reviewed and adjusted: Please take all your medications with you for your next visit with your Primary MD  Please request your Primary MD to go over all hospital tests and procedure/radiological results at the follow up, please ask your Primary MD to get all Hospital records sent to his/her office.  If you experience worsening of your admission symptoms, develop shortness of breath, life threatening emergency, suicidal or homicidal thoughts you must seek medical attention immediately by calling 911 or calling your MD immediately  if symptoms less severe.  You must read complete instructions/literature along with all the possible adverse reactions/side effects for all the Medicines you take and that have been prescribed to you. Take any new Medicines after you have completely understood and accpet all the possible adverse reactions/side effects.   Do not drive when taking Pain medications.   Do not take more than prescribed Pain, Sleep and Anxiety Medications  Special Instructions: If you have smoked or chewed Tobacco  in the last 2 yrs please stop smoking, stop any regular Alcohol  and or any Recreational drug use.  Wear Seat belts while driving.  Please note  You were cared for by a hospitalist during your hospital stay. Once you are discharged, your primary care physician will handle any further  medical issues. Please note that NO REFILLS for any discharge medications will be authorized once you are discharged, as it is imperative that you return to your primary care physician (or establish a relationship with a primary care physician if you do not have one) for your aftercare needs so that they can reassess your need for medications and monitor your lab values.  Diet recommendation: Heart Healthy diet  Discharge Instructions    Call MD for:  persistant nausea and vomiting    Complete by:  As directed      Call MD for:  severe uncontrolled pain    Complete by:  As directed      Diet - low sodium heart healthy    Complete by:  As directed      Increase activity slowly  Complete by:  As directed            Follow-up Information    Follow up with Thora Lance, MD. Schedule an appointment as soon as possible for a visit in 1 week.   Specialty:  Family Medicine   Why:  Hospital follow up   Contact information:   [address] [place] [address] [phone]       Follow up with [facility].   Specialty:  Cardiology   Why:  Office will call with date/time   Contact information:   [address] [place] [address] [phone]      Total Time spent on discharge equals  45 minutes.  SignedJeoffrey Massed 11/02/2015 9:56 AM

## 2015-11-02 NOTE — Discharge Instructions (Addendum)
STOP TAKING THESE MEDICATIONS:  ZIAC (BISOPROLO-HYDROCHLOROTHIAZIDE0  PRINIZIDE/ZESTORETIC (LISINOPRIL-HYDROCHLOROTHIAZIDE)

## 2015-11-02 NOTE — Progress Notes (Signed)
TR BAND REMOVAL  LOCATION:  right radial  DEFLATED PER PROTOCOL:  Yes.    TIME BAND OFF / DRESSING APPLIED:   0030   SITE UPON ARRIVAL:   Level 0  SITE AFTER BAND REMOVAL:  Level 0  REVERSE ALLEN'S TEST:    positive  CIRCULATION SENSATION AND MOVEMENT:  Within Normal Limits  Yes.    COMMENTS:    

## 2015-11-02 NOTE — Progress Notes (Signed)
Patient Name: Tonya Bryant Date of Encounter: 11/02/2015   SUBJECTIVE  Feeling well. No chest pain, sob or palpitations.  CURRENT MEDS . amLODipine  5 mg Oral Daily  . angioplasty book   Does not apply Once  . aspirin EC  81 mg Oral Daily  . atorvastatin  80 mg Oral q1800  . citalopram  40 mg Oral Daily  . lisinopril  20 mg Oral Daily  . pantoprazole  40 mg Oral Q1200  . sodium chloride flush  3 mL Intravenous Q12H  . ticagrelor  90 mg Oral BID    OBJECTIVE  Filed Vitals:   11/01/15 2130 11/01/15 2200 11/01/15 2300 11/02/15 0410  BP: 190/103 187/143 163/68 162/81  Pulse: 51 51 61 57  Temp:    97 F (36.1 C)  TempSrc:    Oral  Resp: Height:      Weight:    214 lb 1.1 oz (97.1 kg)  SpO2: 99% 100% 100% 99%    Intake/Output Summary (Last 24 hours) at 11/02/15 0705 Last data filed at 11/02/15 0410  Gross per 24 hour  Intake    350 ml  Output   1300 ml  Net   -950 ml   Filed Weights   10/31/15 2144 11/02/15 0410  Weight: 211 lb 6.4 oz (95.89 kg) 214 lb 1.1 oz (97.1 kg)    PHYSICAL EXAM  General: Pleasant, NAD. Neuro: Alert and oriented X 3. Moves all extremities spontaneously. Psych: Normal affect. HEENT:  Normal  Neck: Supple without bruits or JVD. Lungs:  Resp regular and unlabored, CTA. Heart: RRR no s3, s4, or murmurs. Abdomen: Soft, non-tender, non-distended, BS + x 4.  Extremities: No clubbing, cyanosis or edema. DP/PT/Radials 2+ and equal bilaterally. R radial cath site stable.   Accessory Clinical Findings  CBC  Recent Labs  11/01/15 0930 11/02/15 0440  WBC 7.1 8.1  HGB 12.7 12.2  HCT 37.9 37.4  MCV 95.7 95.7  PLT 233 251   Basic Metabolic Panel  Recent Labs  11/01/15 0930 11/02/15 0440  NA 140 137  K 3.9 3.6  CL 106 105  CO2 24 23  GLUCOSE 110* 108*  BUN 13 7  CREATININE 1.04* 0.88  CALCIUM 9.0 9.0   Liver Function Tests No results for input(s): AST, ALT, ALKPHOS, BILITOT, PROT, ALBUMIN in the last 72  hours. No results for input(s): LIPASE, AMYLASE in the last 72 hours. Cardiac Enzymes  Recent Labs  10/31/15 2222 11/01/15 0206  TROPONINI <0.03 <0.03   BNP Invalid input(s): POCBNP D-Dimer  Recent Labs  10/31/15 1720  DDIMER 0.83*   Hemoglobin A1C  Recent Labs  10/31/15 2222  HGBA1C 5.8*   Fasting Lipid Panel  Recent Labs  11/01/15 0222  CHOL 195  HDL 53  LDLCALC 126*  TRIG 81  CHOLHDL 3.7   Thyroid Function Tests  Recent Labs  10/31/15 2222  TSH 3.229    TELE  Sinus bradycardia with rate of 40-50s  Radiology/Studies  Dg Chest 2 View  10/31/2015  CLINICAL DATA:  Medial chest pain, pain down LEFT arm, across shoulder and into LEFT neck beginning yesterday; shortness of breath, cough, hypertension EXAM: CHEST  2 VIEW COMPARISON:  08/29/2009 FINDINGS: Mild enlargement of cardiac silhouette. Mediastinal contours and pulmonary vascularity normal. Lungs clear. No pleural effusion or pneumothorax. Mild endplate spur formation thoracic spine. IMPRESSION: Mild enlargement of cardiac silhouette. No acute abnormalities. Electronically Signed   By: Angelyn Punt.D.  On: 10/31/2015 14:06   Ct Angio Chest Pe W/cm &/or Wo Cm  10/31/2015  CLINICAL DATA:  Worsening chest pain and tightness which started about a month ago. Pain occurs with exertion. Associated shortness of breath, nausea, warm feeling, and neck pain. EXAM: CT ANGIOGRAPHY CHEST WITH CONTRAST TECHNIQUE: Multidetector CT imaging of the chest was performed using the standard protocol during bolus administration of intravenous contrast. Multiplanar CT image reconstructions and MIPs were obtained to evaluate the vascular anatomy. CONTRAST:  100mL OMNIPAQUE IOHEXOL 350 MG/ML SOLN COMPARISON:  08/30/2009 FINDINGS: Technically adequate study with good opacification of the central and segmental pulmonary arteries. No focal filling defects identified. No evidence of significant pulmonary embolus. Normal heart size. Normal  caliber thoracic aorta. No aortic dissection. Great vessel origins are patent. Esophagus is decompressed. Small esophageal hiatal hernia. No significant lymphadenopathy in the chest. Coronary artery calcifications. Dependent atelectasis in the lungs. Evaluation of lungs is limited due to respiratory motion artifact but no focal consolidation or airspace disease is suggested. No pleural effusions. No pneumothorax. Airways appear patent. Included portions of the upper abdominal organs demonstrate calcified granulomas in the spleen. Degenerative changes in the spine. No destructive bone lesions. Review of the MIP images confirms the above findings. IMPRESSION: No evidence of significant pulmonary embolus. No evidence of active pulmonary disease. Coronary artery calcifications. Electronically Signed   By: Burman NievesWilliam  Stevens M.D.   On: 10/31/2015 23:08    Left Heart Cath and Coronary Angiography    Conclusion     Ost RCA lesion, 30% stenosed.  Prox LAD lesion, 50% stenosed.  1st Diag lesion, 65% stenosed.  Ramus lesion, 50% stenosed.  Mid Cx lesion, 80% stenosed. Post intervention, there is a 0% residual stenosis.  Dist LAD lesion, 75% stenosed. Post intervention, there is a 0% residual stenosis.  1. Severe 2 vessel CAD treated successfully with DES implantation in the LAD and LCx 2. Known normal LV function by echo  Recommendation: continue ASA/brilinta x 12 months as tolerated. The first diagonal could be treated percutaneously if continued angina, but would treat medically for now    ASSESSMENT AND PLAN  1. Crescendo angina - this has been occurring more frequently over the past month and is very concerning for coronary ischemia. Pain occurs with exertion and rest and radiates into her neck and arms. EKG shows nonspecific ST abnormality. Cardiac enzymes negative.  - D-Dimer is also elevated but chest CT showed coronary artery calcifications.  - Cath showed 2 vessel CAD s/p DES to dist  LAD & mid LCx. Has diffuse disease as well. Plan for medical management for now. PCI of first diagonal if continues to have anginal symptoms.  - Continue ASA/Brilinta/statin/lisinopril.  No BB secondary to bradycardia.  .  2. HTN - Still elevated.  Continue ACE I. Will increase amlodipine to 10mg .   3. Bradycardia - she denies any weakness or fatigue but her CP may be due to low CO from significant bradycardia. Ziac stopped. TSH is normal.   4. HL - 11/01/2015: Cholesterol 195; HDL 53; LDL Cholesterol 126*; Triglycerides 81; VLDL 16  - LDL goal less than 70. Continue lipitor 80mg . Consider OP f/u labs 6-8 weeks given statin initiation this admission.  Dispo: f/u with APP in 2 weeks.   Lorelei PontSigned, Rumi Kolodziej PA-C Pager [phone]

## 2015-11-02 NOTE — Care Management Note (Addendum)
Case Management Note  Patient Details  Name: Tonya Bryant MRN: [id number] Date of Birth: Oct 21, 1950  Subjective/Objective:      Patient is from home, she is indep pta, she is on Brilinta, NCM gave her the 30 day savings card , her co pay is  $80  Per benefit check.  [facility] [place] [facility] Brilinta in stock.  No other needs. NCM informed patient that if for any reasons she should run out of medication and can not get it to call her Cardilogist.              Action/Plan:   Expected Discharge Date:                  Expected Discharge Plan:  Home/Self Care  In-House Referral:     Discharge planning Services  CM Consult  Post Acute Care Choice:    Choice offered to:     DME Arranged:    DME Agency:     HH Arranged:    HH Agency:     Status of Service:  Completed, signed off  Medicare Important Message Given:    Date Medicare IM Given:    Medicare IM give by:    Date Additional Medicare IM Given:    Additional Medicare Important Message give by:     If discussed at Long Length of Stay Meetings, dates discussed:    Additional Comments:  Leone Havenaylor, Zuriah Bordas Clinton, RN 11/02/2015, 10:53 AM

## 2015-11-02 NOTE — Progress Notes (Signed)
CARDIAC REHAB PHASE I   PRE:  Rate/Rhythm: 58 SB asleep    BP: sitting 169/70    SaO2:   MODE:  Ambulation: 450 ft   POST:  Rate/Rhythm: 89 SR    BP: sitting 176/75     SaO2: 97 RA  Pt steady. Seemed reluctant to walk far and after I asked many questions she stated she felt SOB. Rest x3. Continued SOB after sitting for several minutes. Not noticeably SOB to me. ? If pt anxious. Ed completed. Understands importance of Brilinta. Needs booklet. Will send referral to [facility].  [id number]  Harriet MassonRandi Kristan Tariyah Pendry CES, ACSM 11/02/2015 10:03 AM

## 2015-11-02 NOTE — Research (Signed)
Patient meets [employer] for [facility] research study, spoke with Dr. Mayford Knifeurner and thinks patient would be all right but would rather not consent for study.

## 2015-11-13 ENCOUNTER — Telehealth: Payer: Self-pay | Admitting: Cardiology

## 2015-11-13 NOTE — Telephone Encounter (Signed)
Informed patient her letter from the hospital st she may return to work 11/12/15.  Patient is concerned because she is very active at work, carrying bedding and pillows. Reminded patient that she has an appointment tomorrow with Azalee CourseHao Meng, PA, and she may get all of her questions answered at that time if she wishes. She was grateful for call.

## 2015-11-13 NOTE — Telephone Encounter (Signed)
New message    Patient calling returning back to work today wants to talk the nurse regarding restrictions

## 2015-11-14 ENCOUNTER — Ambulatory Visit (INDEPENDENT_AMBULATORY_CARE_PROVIDER_SITE_OTHER): Payer: BLUE CROSS/BLUE SHIELD | Admitting: Physician Assistant

## 2015-11-14 ENCOUNTER — Encounter: Payer: Self-pay | Admitting: Physician Assistant

## 2015-11-14 VITALS — BP 128/78 | HR 76 | Ht 59.84 in | Wt 207.0 lb

## 2015-11-14 DIAGNOSIS — E785 Hyperlipidemia, unspecified: Secondary | ICD-10-CM

## 2015-11-14 DIAGNOSIS — I2510 Atherosclerotic heart disease of native coronary artery without angina pectoris: Secondary | ICD-10-CM

## 2015-11-14 MED ORDER — NITROGLYCERIN 0.4 MG SL SUBL: 0.4000 mg | SUBLINGUAL_TABLET | SUBLINGUAL | Status: DC | PRN

## 2015-11-14 NOTE — Patient Instructions (Signed)
Your physician wants you to follow-up in: 3 Months with Dr Mayford Knifeurner. You will receive a reminder letter in the mail two months in advance. If you don't receive a letter, please call our office to schedule the follow-up appointment.  Your physician recommends that you return for lab work in: 2 Months Fasting Lipids Liver function Test

## 2015-11-14 NOTE — Progress Notes (Signed)
Cardiology Office Note   Date:  11/14/2015   ID:  Shakenya J Bryant, DOB [birth year], MRN [id number]  PCP:  Thora Lance, MD  Cardiologist:  Dr. Mayford Knife  Chief Complaint  Patient presents with  . Follow-up    seen for Dr. Mayford Knife      History of Present Illness: Tonya Bryant is a 65 y.o. female who presents for post hospital follow-up. She was recently admitted from 3/8-3/10 with chest pain. She has a history of hypertension, obesity and anxiety. She describes the chest pain is exertional in nature. D-dimer was mildly elevated at 0.8, CTA of the chest was obtained on 3/8 which showed no PE, however did reveal coronary artery calcification. Given her exertional symptom, she underwent cardiac catheterization on 11/01/2015 which showed 50% proximal LAD lesion, 60% D1 lesion, 50% ramus lesion, 80% mid left circumflex lesion with DES, 70% distal LAD lesion also treated with DES. Postprocedure, she was started on aspirin and Brilinta. She was eventually discharged on 3/10, it was recommended that if she has recurrent angina, we could consider PCI of the diagonal and a later time.  She presents today for post hospital follow-up. She has been doing well since discharge. She denies any chest discomfort. The only thing she complains is still some mild shortness of breath with exertion. She is going to contact cardiac rehabilitation. Apparently she was told to take it easy and hold off going back to work, I have cleared her to go back to work on Monday 11/19/2015.    Past Medical History  Diagnosis Date  . Hypertension   . Anxiety   . Depression (emotion)   . Full dentures   . CAD (coronary artery disease), native coronary artery 11/02/2015    Ost RCA lesion, 30% stenosed, Prox LAD lesion, 50% stenosed, 1st Diag lesion, 65% stenosed, Ramus lesion, 50% stenosed, Mid Cx lesion, 80% stenosed, Dist LAD lesion, 75% stenosed. S/P DES to distal LAD and mid Cx 10/2015    Past Surgical History    Procedure Laterality Date  . Breast surgery      lt br bx-neg  . Toe osteotomy      rt big toe  . Tubal ligation    . Colonoscopy    . Tooth extraction    . Carpal tunnel release Left 07/07/2013    Procedure: LEFT CARPAL TUNNEL RELEASE;  Surgeon: Wyn Forster., MD;  Location: [facility];  Service: Orthopedics;  Laterality: Left;  . Trigger finger release Left 07/07/2013    Procedure: LEFT THUMB RELEASE A-1 PULLEY;  Surgeon: Wyn Forster., MD;  Location: [facility];  Service: Orthopedics;  Laterality: Left;  . Cardiac catheterization N/A 11/01/2015    Procedure: Left Heart Cath and Coronary Angiography;  Surgeon: Tonny Bollman, MD;  Location: [facility] INVASIVE CV LAB;  Service: Cardiovascular;  Laterality: N/A;     Current Outpatient Prescriptions  Medication Sig Dispense Refill  . amLODipine (NORVASC) 10 MG tablet Take 1 tablet (10 mg total) by mouth daily. 90 tablet 0  . aspirin 81 MG tablet Take 1 tablet (81 mg total) by mouth daily. 90 tablet 0  . atorvastatin (LIPITOR) 80 MG tablet Take 1 tablet (80 mg total) by mouth daily at 6 PM. 90 tablet 0  . citalopram (CELEXA) 40 MG tablet Take 0.5 tablets (20 mg total) by mouth daily.    . diclofenac (VOLTAREN) 75 MG EC tablet Take 1 tablet (75 mg total) by mouth  2 (two) times daily. (Patient not taking: Reported on 10/31/2015) 50 tablet 2  . lisinopril (PRINIVIL,ZESTRIL) 20 MG tablet Take 1 tablet (20 mg total) by mouth daily. 30 tablet 0  . nitroGLYCERIN (NITROSTAT) 0.4 MG SL tablet Place 1 tablet (0.4 mg total) under the tongue every 5 (five) minutes as needed for chest pain (for 3 doses only). 30 tablet 0  . pantoprazole (PROTONIX) 40 MG tablet Take 1 tablet (40 mg total) by mouth daily at 12 noon. 90 tablet 0  . ticagrelor (BRILINTA) 90 MG TABS tablet Take 1 tablet (90 mg total) by mouth 2 (two) times daily. 180 tablet 0   No current facility-administered medications for this visit.    Allergies:    Review of patient's allergies indicates no known allergies.    Social History:  The patient  reports that she has never smoked. She does not have any smokeless tobacco history on file. She reports that she drinks about 0.5 oz of alcohol per week. She reports that she does not use illicit drugs.   Family History:  The patient's family history includes Cancer in her mother; Diabetes in her father and mother; Gout in her father; Hypertension in her father and mother; Stroke in her father and mother.    ROS:  Please see the history of present illness.   Otherwise, review of systems are positive for mild DOE.   All other systems are reviewed and negative.    PHYSICAL EXAM: VS:  There were no vitals taken for this visit. , BMI There is no weight on file to calculate BMI. GEN: Well nourished, well developed, in no acute distress HEENT: normal Neck: no JVD, carotid bruits, or masses Cardiac: RRR; no murmurs, rubs, or gallops,no edema  Respiratory:  clear to auscultation bilaterally, normal work of breathing GI: soft, nontender, nondistended, + BS MS: no deformity or atrophy Skin: warm and dry, no rash Neuro:  Strength and sensation are intact Psych: euthymic mood, full affect   EKG:  EKG is ordered today. The ekg ordered today demonstrates NSR without significant change   Recent Labs: 10/31/2015: TSH 3.229 11/02/2015: BUN 7; Creatinine, Ser 0.88; Hemoglobin 12.2; Platelets 251; Potassium 3.6; Sodium 137    Lipid Panel    Component Value Date/Time   CHOL 195 11/01/2015 0222   TRIG 81 11/01/2015 0222   HDL 53 11/01/2015 0222   CHOLHDL 3.7 11/01/2015 0222   VLDL 16 11/01/2015 0222   LDLCALC 126* 11/01/2015 0222      Wt Readings from Last 3 Encounters:  11/02/15 214 lb 1.1 oz (97.1 kg)  02/09/14 198 lb (89.812 kg)  07/07/13 210 lb (95.255 kg)      Other studies Reviewed: Additional studies/ records that were reviewed today include:   Echo  11/01/2015 ------------------------------------------------------------------- LV EF: 60% - 65%  ------------------------------------------------------------------- Indications: Chest pain 786.51.  ------------------------------------------------------------------- History: PMH: Bradycardia. Angina pectoris. Risk factors: Hypertension.  ------------------------------------------------------------------- Study Conclusions  - Left ventricle: The cavity size was normal. There was moderate  concentric hypertrophy. Systolic function was normal. The  estimated ejection fraction was in the range of 60% to 65%. Wall  motion was normal; there were no regional wall motion  abnormalities. Features are consistent with a pseudonormal left  ventricular filling pattern, with concomitant abnormal relaxation  and increased filling pressure (grade 2 diastolic dysfunction). - Aortic valve: Transvalvular velocity was within the normal range.  There was no stenosis. There was no regurgitation. - Mitral valve: There was trivial regurgitation. - Left  atrium: The atrium was moderately dilated. - Right ventricle: The cavity size was normal. Wall thickness was  normal. Systolic function was normal. - Right atrium: The atrium was mildly dilated. - Atrial septum: No defect or patent foramen ovale was identified  by color flow Doppler. - Tricuspid valve: There was trivial regurgitation. - Inferior vena cava: The vessel was normal in size. The  respirophasic diameter changes were in the normal range (>= 50%),  consistent with normal central venous pressure.   Cath 11/01/2015 Conclusion     Ost RCA lesion, 30% stenosed.  Prox LAD lesion, 50% stenosed.  1st Diag lesion, 65% stenosed.  Ramus lesion, 50% stenosed.  Mid Cx lesion, 80% stenosed. Post intervention, there is a 0% residual stenosis.  Dist LAD lesion, 75% stenosed. Post intervention, there is a 0% residual  stenosis.  1. Severe 2 vessel CAD treated successfully with DES implantation in the LAD and LCx 2. Known normal LV function by echo  Recommendation: continue ASA/brilinta x 12 months as tolerated. The first diagonal could be treated percutaneously if continued angina, but would treat medically for now      Review of the above records demonstrates:   Recently diagnosed CAD s/p DES x 2   ASSESSMENT AND PLAN:  1.  CAD  - cardiac catheterization 11/01/2015 50% proximal LAD lesion, 60% D1 lesion, 50% ramus lesion, 80% mid left circumflex lesion with DES, 70% distal LAD lesion also treated with DES.   - no obvious angina, continue ASA, brilinta, amlodipine, lipitor, lisinopril. Not on BB due to baseline bradycardia  2. HTN: controlled  3. HLD: recheck lipid panel and LFT in 2 month   Current medicines are reviewed at length with the patient today.  The patient does not have concerns regarding medicines.  The following changes have been made:  None  Labs/ tests ordered today include:  No orders of the defined types were placed in this encounter.     Disposition:   FU with Dr. Mayford Knifeurner in 3 months  Signed, Azalee CourseMeng, Aubre Quincy, [place]  11/14/2015 6:28 AM    [facility] [address], [place], [place]  [address] Phone: [phone]; Fax: [phone]

## 2015-11-21 ENCOUNTER — Observation Stay (HOSPITAL_COMMUNITY)
Admission: EM | Admit: 2015-11-21 | Discharge: 2015-11-22 | Disposition: A | Discharge status: Home / Self Care | Payer: BLUE CROSS/BLUE SHIELD | Attending: Interventional Cardiology | Admitting: Interventional Cardiology

## 2015-11-21 ENCOUNTER — Emergency Department (HOSPITAL_COMMUNITY): Payer: BLUE CROSS/BLUE SHIELD

## 2015-11-21 ENCOUNTER — Telehealth: Payer: Self-pay | Admitting: Cardiology

## 2015-11-21 ENCOUNTER — Encounter (HOSPITAL_COMMUNITY): Payer: Self-pay | Admitting: Emergency Medicine

## 2015-11-21 DIAGNOSIS — R079 Chest pain, unspecified: Secondary | ICD-10-CM | POA: Diagnosis present

## 2015-11-21 DIAGNOSIS — I2510 Atherosclerotic heart disease of native coronary artery without angina pectoris: Secondary | ICD-10-CM | POA: Diagnosis not present

## 2015-11-21 DIAGNOSIS — I25118 Atherosclerotic heart disease of native coronary artery with other forms of angina pectoris: Secondary | ICD-10-CM | POA: Diagnosis not present

## 2015-11-21 DIAGNOSIS — F329 Major depressive disorder, single episode, unspecified: Secondary | ICD-10-CM | POA: Insufficient documentation

## 2015-11-21 DIAGNOSIS — E669 Obesity, unspecified: Secondary | ICD-10-CM | POA: Diagnosis present

## 2015-11-21 DIAGNOSIS — Z7982 Long term (current) use of aspirin: Secondary | ICD-10-CM | POA: Diagnosis not present

## 2015-11-21 DIAGNOSIS — I10 Essential (primary) hypertension: Secondary | ICD-10-CM | POA: Diagnosis present

## 2015-11-21 DIAGNOSIS — F419 Anxiety disorder, unspecified: Secondary | ICD-10-CM | POA: Insufficient documentation

## 2015-11-21 DIAGNOSIS — R0789 Other chest pain: Secondary | ICD-10-CM | POA: Diagnosis not present

## 2015-11-21 DIAGNOSIS — Z79899 Other long term (current) drug therapy: Secondary | ICD-10-CM | POA: Insufficient documentation

## 2015-11-21 DIAGNOSIS — Z791 Long term (current) use of non-steroidal anti-inflammatories (NSAID): Secondary | ICD-10-CM | POA: Insufficient documentation

## 2015-11-21 DIAGNOSIS — I25119 Atherosclerotic heart disease of native coronary artery with unspecified angina pectoris: Secondary | ICD-10-CM | POA: Diagnosis present

## 2015-11-21 LAB — CBC
HCT: 35.5 % — ABNORMAL LOW (ref 36.0–46.0)
HCT: 37.3 % (ref 36.0–46.0)
HEMOGLOBIN: 12 g/dL (ref 12.0–15.0)
Hemoglobin: 12.1 g/dL (ref 12.0–15.0)
MCH: 32.3 pg (ref 26.0–34.0)
MCH: 30.9 pg (ref 26.0–34.0)
MCHC: 33.8 g/dL (ref 30.0–36.0)
MCHC: 32.4 g/dL (ref 30.0–36.0)
MCV: 95.7 fL (ref 78.0–100.0)
MCV: 95.4 fL (ref 78.0–100.0)
PLATELETS: 261 10*3/uL (ref 150–400)
PLATELETS: 284 10*3/uL (ref 150–400)
RBC: 3.71 MIL/uL — ABNORMAL LOW (ref 3.87–5.11)
RBC: 3.91 MIL/uL (ref 3.87–5.11)
RDW: 12.7 % (ref 11.5–15.5)
RDW: 12.6 % (ref 11.5–15.5)
WBC: 8 10*3/uL (ref 4.0–10.5)
WBC: 7.9 10*3/uL (ref 4.0–10.5)

## 2015-11-21 LAB — COMPREHENSIVE METABOLIC PANEL
ALT: 25 U/L (ref 14–54)
ANION GAP: 10 (ref 5–15)
AST: 20 U/L (ref 15–41)
Albumin: 3.6 g/dL (ref 3.5–5.0)
Alkaline Phosphatase: 49 U/L (ref 38–126)
BUN: 13 mg/dL (ref 6–20)
CHLORIDE: 108 mmol/L (ref 101–111)
CO2: 22 mmol/L (ref 22–32)
CREATININE: 0.89 mg/dL (ref 0.44–1.00)
Calcium: 9.3 mg/dL (ref 8.9–10.3)
Glucose, Bld: 137 mg/dL — ABNORMAL HIGH (ref 65–99)
POTASSIUM: 3.7 mmol/L (ref 3.5–5.1)
SODIUM: 140 mmol/L (ref 135–145)
Total Bilirubin: 0.9 mg/dL (ref 0.3–1.2)
Total Protein: 7 g/dL (ref 6.5–8.1)

## 2015-11-21 LAB — TSH: TSH: 1.803 u[IU]/mL (ref 0.350–4.500)

## 2015-11-21 LAB — TROPONIN I

## 2015-11-21 LAB — I-STAT TROPONIN, ED: TROPONIN I, POC: 0 ng/mL (ref 0.00–0.08)

## 2015-11-21 LAB — BASIC METABOLIC PANEL
ANION GAP: 9 (ref 5–15)
BUN: 13 mg/dL (ref 6–20)
CALCIUM: 9.2 mg/dL (ref 8.9–10.3)
CO2: 20 mmol/L — ABNORMAL LOW (ref 22–32)
Chloride: 111 mmol/L (ref 101–111)
Creatinine, Ser: 0.88 mg/dL (ref 0.44–1.00)
GLUCOSE: 122 mg/dL — AB (ref 65–99)
Potassium: 4.2 mmol/L (ref 3.5–5.1)
Sodium: 140 mmol/L (ref 135–145)

## 2015-11-21 LAB — MAGNESIUM: MAGNESIUM: 2 mg/dL (ref 1.7–2.4)

## 2015-11-21 LAB — T4, FREE: Free T4: 0.88 ng/dL (ref 0.61–1.12)

## 2015-11-21 LAB — BRAIN NATRIURETIC PEPTIDE: B Natriuretic Peptide: 34.4 pg/mL (ref 0.0–100.0)

## 2015-11-21 MED ORDER — CITALOPRAM HYDROBROMIDE 20 MG PO TABS
20.0000 mg | ORAL_TABLET | Freq: Every day | ORAL | Status: DC
  Administered 2015-11-22: 20 mg via ORAL
  Filled 2015-11-21: qty 1

## 2015-11-21 MED ORDER — ONDANSETRON HCL 4 MG/2ML IJ SOLN: 4.0000 mg | Freq: Four times a day (QID) | INTRAMUSCULAR | Status: DC | PRN

## 2015-11-21 MED ORDER — PANTOPRAZOLE SODIUM 40 MG PO TBEC
40.0000 mg | DELAYED_RELEASE_TABLET | Freq: Every day | ORAL | Status: DC
  Administered 2015-11-22: 40 mg via ORAL
  Filled 2015-11-21: qty 1

## 2015-11-21 MED ORDER — NITROGLYCERIN 0.4 MG SL SUBL: 0.4000 mg | SUBLINGUAL_TABLET | SUBLINGUAL | Status: DC | PRN

## 2015-11-21 MED ORDER — ASPIRIN EC 81 MG PO TBEC
81.0000 mg | DELAYED_RELEASE_TABLET | Freq: Every day | ORAL | Status: DC
Start: 2015-11-22 — End: 2015-11-21

## 2015-11-21 MED ORDER — TICAGRELOR 90 MG PO TABS
90.0000 mg | ORAL_TABLET | Freq: Two times a day (BID) | ORAL | Status: DC
  Administered 2015-11-21 – 2015-11-22 (×2): 90 mg via ORAL
  Filled 2015-11-21 (×2): qty 1

## 2015-11-21 MED ORDER — ASPIRIN 81 MG PO CHEW
324.0000 mg | CHEWABLE_TABLET | ORAL | Status: AC
  Administered 2015-11-21: 324 mg via ORAL
  Filled 2015-11-21: qty 4

## 2015-11-21 MED ORDER — LISINOPRIL 20 MG PO TABS
20.0000 mg | ORAL_TABLET | Freq: Every day | ORAL | Status: DC
  Administered 2015-11-22: 20 mg via ORAL
  Filled 2015-11-21: qty 1

## 2015-11-21 MED ORDER — ACETAMINOPHEN 325 MG PO TABS: 650.0000 mg | ORAL_TABLET | ORAL | Status: DC | PRN

## 2015-11-21 MED ORDER — ATORVASTATIN CALCIUM 80 MG PO TABS: 80.0000 mg | ORAL_TABLET | Freq: Every day | ORAL | Status: DC

## 2015-11-21 MED ORDER — ASPIRIN EC 81 MG PO TBEC
81.0000 mg | DELAYED_RELEASE_TABLET | Freq: Every day | ORAL | Status: DC
  Administered 2015-11-22: 81 mg via ORAL
  Filled 2015-11-21: qty 1

## 2015-11-21 MED ORDER — ASPIRIN 300 MG RE SUPP: 300.0000 mg | RECTAL | Status: AC

## 2015-11-21 MED ORDER — AMLODIPINE BESYLATE 10 MG PO TABS
10.0000 mg | ORAL_TABLET | Freq: Every day | ORAL | Status: DC
  Administered 2015-11-22: 10 mg via ORAL
  Filled 2015-11-21: qty 1

## 2015-11-21 MED ORDER — ENOXAPARIN SODIUM 100 MG/ML ~~LOC~~ SOLN
1.0000 mg/kg | Freq: Two times a day (BID) | SUBCUTANEOUS | Status: DC
  Administered 2015-11-21 – 2015-11-22 (×2): 95 mg via SUBCUTANEOUS
  Filled 2015-11-21 (×2): qty 1

## 2015-11-21 NOTE — Telephone Encounter (Signed)
Pt called C/O of chest pressure started last night when she was at work also pain on right shoulder and went across her chest. She had a continues pressure during the night. Then the pain went away . At about 4 AM pt has chest pressure which lasted a few minutes, and she had some burping.the pressure was not as bad as last night. Pain score "6 to 7" . Pt denies SOB or nauseas. Pt denies pain at this time. Pt has not taken NTG SL.Pt had stents placement  On 10/31/15. Pt states that the pain is not the same as before she had the stent placement. Pt does not have a way to take her BP at this time. Pt took all her medications this AM. Dr Mayford Knifeurner is aware of pt's symptoms , and recommended for pt to go to the ER. Pt is aware. Pt states has a family member to take her to the ER. [employer] aware of pt's arrival to the ER.

## 2015-11-21 NOTE — H&P (Signed)
Tonya Bryant is a 65 y.o. female  Admit Date: 11/21/2015 Referring Physician: Armanda Magicraci Turner, M.D. Primary Cardiologist:: Armanda Magicraci Turner, M.D. Chief complaint / reason for admission: Back shoulder and chest discomfort  HPI: 65 year old African-American female with history of CAD and recent implantation of drug-eluting stents in the mid circumflex and distal LAD. Residual 75% diagonal stenosis was also noted. Patient is now well for 2-3 weeks post stent implantation. 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 The neck and shoulder discomfort was present 2-3 hours before the chest discomfort started. The chest discomfort was described as tightness or pressure. The discomfort was dissimilar to her previous coronary complaints. The discomfort in the chest waxed and waned throughout the night. She still had some residual discomfort when she awakened this morning area because of this she came to the emergency room and was put in a room at around noon time. At that point the chest discomfort every soft. The EKG was unremarkable. She currently feels well.  2 siblings, mother and father have coronary disease. She is compliant with her medical regimen. She has not missed any doses of her antiplatelet therapy.    PMH:    Past Medical History  Diagnosis Date  . Hypertension   . Anxiety   . Depression (emotion)   . Full dentures   . CAD (coronary artery disease), native coronary artery 11/02/2015    Ost RCA lesion, 30% stenosed, Prox LAD lesion, 50% stenosed, 1st Diag lesion, 65% stenosed, Ramus lesion, 50% stenosed, Mid Cx lesion, 80% stenosed, Dist LAD lesion, 75% stenosed. S/P DES to distal LAD and mid Cx 10/2015    PSH:    Past Surgical History  Procedure Laterality Date  . Breast surgery      lt br bx-neg  . Toe osteotomy      rt big toe  . Tubal ligation    . Colonoscopy    . Tooth  extraction    . Carpal tunnel release Left 07/07/2013    Procedure: LEFT CARPAL TUNNEL RELEASE;  Surgeon: Wyn Forsterobert V Sypher Jr., MD;  Location: [facility];  Service: Orthopedics;  Laterality: Left;  . Trigger finger release Left 07/07/2013    Procedure: LEFT THUMB RELEASE A-1 PULLEY;  Surgeon: Wyn Forsterobert V Sypher Jr., MD;  Location: [facility];  Service: Orthopedics;  Laterality: Left;  . Cardiac catheterization N/A 11/01/2015    Procedure: Left Heart Cath and Coronary Angiography;  Surgeon: Tonny BollmanMichael Cooper, MD;  [facility];  Service: Cardiovascular;  Laterality: N/A;   ALLERGIES:   Review of patient's allergies indicates no known allergies. Prior to Admit Meds:   (Not in a hospital admission) Family HX:    Family History  Problem Relation Age of Onset  . Cancer Mother   . Diabetes Mother   . Stroke Mother   . Hypertension Mother   . Stroke Father   . Diabetes Father   . Hypertension Father   . Gout Father    Social HX:    Social History   Social History  . Marital Status: Single    Spouse Name: N/A  . Number of Children: N/A  . Years of Education: N/A   Occupational History  . Not on file.   Social History Main Topics  . Smoking status: Never Smoker   . Smokeless tobacco: Not on file  . Alcohol Use: 0.5  oz/week    1 drink(s) per week  . Drug Use: No  . Sexual Activity: Not on file   Other Topics Concern  . Not on file   Social History Narrative     ZOX:WRUEA in her lower extremities last evening. Since her stent procedure exertional tolerance has significantly increased. At the case compliance with medical therapy. 12 point review of systems is otherwise unremarkable.  Physical Exam: Blood pressure 124/86, pulse 47, temperature 98.1 F (36.7 C), temperature source Oral, resp. rate 25, SpO2 98 %.    Obese, in no distress, with warm dry skin. HEENT exam reveals no jaundice or pallor. Neck exam reveals no JVD or carotid  bruits. Chest is clear to auscultation and percussion. Cardiac exam reveals no gallop, rub, click, or murmur. PMI is nonpalpable. Abdomen is nontender. Bowel sounds are normal. Extremities reveal 2+ carotids 2+ radial pulses bilateral. Right radial has minimal firmness secondary to recent catheterization/PCI. No tenderness is noted. There is no peripheral edema. Posterior tibial pulses are trace to 1+ bilateral. Neurological exam is unremarkable.  Labs: Lab Results  Component Value Date   WBC 8.0 11/21/2015   HGB 12.0 11/21/2015   HCT 35.5* 11/21/2015   MCV 95.7 11/21/2015   PLT 261 11/21/2015    Recent Labs Lab 11/21/15 1238  NA 140  K 4.2  CL 111  CO2 20*  BUN 13  CREATININE 0.88  CALCIUM 9.2  GLUCOSE 122*   Lab Results  Component Value Date   CKTOTAL 205* 08/30/2009   CKMB 1.3 08/30/2009   TROPONINI <0.03 11/01/2015     Radiology:  Dg Chest 2 View  11/21/2015  CLINICAL DATA:  Chest pain that began last night along with shortness of breath. EXAM: CHEST  2 VIEW COMPARISON:  10/31/2015 FINDINGS: The heart size and mediastinal contours are within normal limits. Both lungs are clear. The visualized skeletal structures are unremarkable. IMPRESSION: No active cardiopulmonary disease. Electronically Signed   By: Charlett Nose M.D.   On: 11/21/2015 13:06    EKG:  Normal sinus rhythm with atrial bigeminy. Nonspecific T-wave flattening.  ASSESSMENT:  1. Left shoulder lower neck and chest discomfort. No evidence of ischemia on ECG or marker evidence of injury. I reviewed the digital images from the PCI procedure, and she does have residual moderately severe diagonal disease. Whether this has anything to do with the presentation are not is unclear. For the duration of discomfort that she had, I would anticipate the presence of elevated markers are some EKG evidence of ischemia.\  2. Obesity  3. Hypertension  4. Hyperlipidemia  Plan:  1. Observation 2. May need repeat  coronary angioma 3. Cycle cardiac markers 4. Nothing by mouth after midnight and will make final decision concerning workup based upon data at that time. 5. Start long-acting nitrates   Lesleigh Noe 11/21/2015 6:00 PM

## 2015-11-21 NOTE — ED Provider Notes (Signed)
CSN: [id number]     Arrival date & time 11/21/15  1220 History   First MD Initiated Contact with Patient 11/21/15 1547     Chief Complaint  Patient presents with  . Chest Pain     Patient is a 65 y.o. female presenting with chest pain. The history is provided by the patient.  Chest Pain Associated symptoms: no back pain, no headache, no nausea, no numbness, not vomiting and no weakness   Patient had a heart catheterization with stent around 10 days ago. Had 2 stents and did have another lesion that would require stenting of symptoms continued. She been doing well and went back to work yesterday but developed pain in her upper back. States that was tight. States it did come and go somewhat but stopped after she stopped working. She states she also had a pressure on her mid chest. That was not the same time as shoulder pain. No nausea vomiting. No fevers. States that both these pains did feel different than when she had her stents. No swelling or legs. No fevers. No cough. Slight pain in her upper shoulders now. Pain was dull in her upper shoulders last night. Slight radiation of the anterior chest.  Past Medical History  Diagnosis Date  . Hypertension   . Anxiety   . Depression (emotion)   . Full dentures   . CAD (coronary artery disease), native coronary artery 11/02/2015    Ost RCA lesion, 30% stenosed, Prox LAD lesion, 50% stenosed, 1st Diag lesion, 65% stenosed, Ramus lesion, 50% stenosed, Mid Cx lesion, 80% stenosed, Dist LAD lesion, 75% stenosed. S/P DES to distal LAD and mid Cx 10/2015   Past Surgical History  Procedure Laterality Date  . Breast surgery      lt br bx-neg  . Toe osteotomy      rt big toe  . Tubal ligation    . Colonoscopy    . Tooth extraction    . Carpal tunnel release Left 07/07/2013    Procedure: LEFT CARPAL TUNNEL RELEASE;  Surgeon: Wyn Forster., MD;  Location: [facility];  Service: Orthopedics;  Laterality: Left;  . Trigger finger  release Left 07/07/2013    Procedure: LEFT THUMB RELEASE A-1 PULLEY;  Surgeon: Wyn Forster., MD;  Location: [facility];  Service: Orthopedics;  Laterality: Left;  . Cardiac catheterization N/A 11/01/2015    Procedure: Left Heart Cath and Coronary Angiography;  Surgeon: Tonny Bollman, MD;  Location: [facility] INVASIVE CV LAB;  Service: Cardiovascular;  Laterality: N/A;   Family History  Problem Relation Age of Onset  . Cancer Mother   . Diabetes Mother   . Stroke Mother   . Hypertension Mother   . Stroke Father   . Diabetes Father   . Hypertension Father   . Gout Father    Social History  Substance Use Topics  . Smoking status: Never Smoker   . Smokeless tobacco: None  . Alcohol Use: 0.5 oz/week    1 drink(s) per week   OB History    No data available     Review of Systems  Constitutional: Negative for activity change and appetite change.  Eyes: Negative for pain.  Respiratory: Negative for chest tightness.   Cardiovascular: Positive for chest pain. Negative for leg swelling.  Gastrointestinal: Negative for nausea, vomiting and diarrhea.  Genitourinary: Negative for flank pain.  Musculoskeletal: Negative for back pain and neck stiffness.  Skin: Negative for rash.  Neurological: Negative  for weakness, numbness and headaches.  Psychiatric/Behavioral: Negative for behavioral problems.      Allergies  Review of patient's allergies indicates no known allergies.  Home Medications   Prior to Admission medications   Medication Sig Start Date End Date Taking? Authorizing Provider  amLODipine (NORVASC) 10 MG tablet Take 1 tablet (10 mg total) by mouth daily. 11/02/15  Yes Shanker Levora Dredge, MD  aspirin 81 MG tablet Take 1 tablet (81 mg total) by mouth daily. 11/02/15  Yes Shanker Levora Dredge, MD  atorvastatin (LIPITOR) 80 MG tablet Take 1 tablet (80 mg total) by mouth daily at 6 PM. 11/02/15  Yes Shanker Levora Dredge, MD  citalopram (CELEXA) 40 MG tablet Take 0.5  tablets (20 mg total) by mouth daily. 11/02/15  Yes Shanker Levora Dredge, MD  diclofenac (VOLTAREN) 75 MG EC tablet Take 1 tablet (75 mg total) by mouth 2 (two) times daily. 04/24/15  Yes Kirstie Peri Regal, DPM  lisinopril (PRINIVIL,ZESTRIL) 20 MG tablet Take 1 tablet (20 mg total) by mouth daily. 11/02/15  Yes Shanker Levora Dredge, MD  nitroGLYCERIN (NITROSTAT) 0.4 MG SL tablet Place 1 tablet (0.4 mg total) under the tongue every 5 (five) minutes as needed for chest pain (for 3 doses only). 11/14/15  Yes Azalee Course, PA  pantoprazole (PROTONIX) 40 MG tablet Take 1 tablet (40 mg total) by mouth daily at 12 noon. 11/02/15  Yes Shanker Levora Dredge, MD  ticagrelor (BRILINTA) 90 MG TABS tablet Take 1 tablet (90 mg total) by mouth 2 (two) times daily. 11/02/15  Yes Shanker Levora Dredge, MD   BP 137/74 mmHg  Pulse 91  Temp(Src) 98.6 F (37 C) (Oral)  Resp 20  Ht 5' (1.524 m)  Wt 205 lb 4.8 oz (93.123 kg)  BMI 40.09 kg/m2  SpO2 98% Physical Exam  Constitutional: She is oriented to person, place, and time. She appears well-developed and well-nourished.  HENT:  Head: Normocephalic and atraumatic.  Eyes: Pupils are equal, round, and reactive to light.  Neck: Normal range of motion. Neck supple.  Cardiovascular: Normal rate, regular rhythm and normal heart sounds.   No murmur heard. Pulmonary/Chest: Effort normal and breath sounds normal. No respiratory distress. She has no wheezes. She has no rales. She exhibits tenderness.  Tenderness to right upper anterior chest wall. No Crepitance or deformity.  Abdominal: Soft. Bowel sounds are normal. She exhibits no distension.  Musculoskeletal: Normal range of motion. She exhibits no edema.  Neurological: She is alert and oriented to person, place, and time. No cranial nerve deficit.  Skin: Skin is warm.  Psychiatric: Her speech is normal.  Nursing note and vitals reviewed.   ED Course  Procedures (including critical care time) Labs Review Labs Reviewed  BASIC METABOLIC  PANEL - Abnormal; Notable for the following:    CO2 20 (*)    Glucose, Bld 122 (*)    All other components within normal limits  CBC - Abnormal; Notable for the following:    RBC 3.71 (*)    HCT 35.5 (*)    All other components within normal limits  COMPREHENSIVE METABOLIC PANEL - Abnormal; Notable for the following:    Glucose, Bld 137 (*)    All other components within normal limits  CBC  MAGNESIUM  TROPONIN I  TSH  T4, FREE  BRAIN NATRIURETIC PEPTIDE  TROPONIN I  TROPONIN I  BASIC METABOLIC PANEL  LIPID PANEL  Rosezena Sensor, ED    Imaging Review Dg Chest 2 View  11/21/2015  CLINICAL DATA:  Chest pain that began last night along with shortness of breath. EXAM: CHEST  2 VIEW COMPARISON:  10/31/2015 FINDINGS: The heart size and mediastinal contours are within normal limits. Both lungs are clear. The visualized skeletal structures are unremarkable. IMPRESSION: No active cardiopulmonary disease. Electronically Signed   By: Charlett NoseKevin  Dover M.D.   On: 11/21/2015 13:06   I have personally reviewed and evaluated these images and lab results as part of my medical decision-making.   EKG Interpretation   Date/Time:  Wednesday November 21 2015 12:28:08 EDT Ventricular Rate:  97 PR Interval:    QRS Duration: 78 QT Interval:  358 QTC Calculation: 454 R Axis:   -4 Text Interpretation:  Sinus rhythm with frequent pacs Minimal voltage  criteria for LVH, may be normal variant Cannot rule out Anterior infarct ,  age undetermined Abnormal ECG Confirmed by Rubin PayorPICKERING  MD, Connor Foxworthy [phone]  on 11/21/2015 3:49:55 PM      MDM   Final diagnoses:  Chest pain, unspecified chest pain type    Patient with chest pain. History of recent stents. There was one other    lesion that looks like the plan was to stent if she can had continued pain. Patient's EKG is reassuring and troponin is negative, however with chest pain will admit to cardiology.   Benjiman CoreNathan Gilad Dugger, MD 11/22/15 [phone]

## 2015-11-21 NOTE — ED Notes (Signed)
Pt sts left sided CP across shoulders starting last night; pt had recent stent placement

## 2015-11-21 NOTE — Telephone Encounter (Signed)
Pt c/o of Chest Pain: STAT if CP now or developed within 24 hours  Pt went to work for first time after [employer] yes[employer]da- after work felt tired and c/o chest pain last night   1. Are you having CP right now? Not as bad as last night-   2. Are you experiencing any other symptoms (ex. SOB, nausea, vomiting, sweating)? no  3. How long have you been experiencing CP? Last night   4. Is your CP continuous or coming and going? Continuous last night, this am- comes and goes   5. Have you taken Nitroglycerin? no ?

## 2015-11-22 ENCOUNTER — Other Ambulatory Visit: Payer: Self-pay

## 2015-11-22 DIAGNOSIS — R0789 Other chest pain: Secondary | ICD-10-CM | POA: Diagnosis not present

## 2015-11-22 DIAGNOSIS — F329 Major depressive disorder, single episode, unspecified: Secondary | ICD-10-CM | POA: Diagnosis not present

## 2015-11-22 DIAGNOSIS — I10 Essential (primary) hypertension: Secondary | ICD-10-CM | POA: Diagnosis not present

## 2015-11-22 DIAGNOSIS — I25118 Atherosclerotic heart disease of native coronary artery with other forms of angina pectoris: Secondary | ICD-10-CM | POA: Diagnosis not present

## 2015-11-22 DIAGNOSIS — R079 Chest pain, unspecified: Secondary | ICD-10-CM | POA: Diagnosis not present

## 2015-11-22 DIAGNOSIS — F419 Anxiety disorder, unspecified: Secondary | ICD-10-CM | POA: Diagnosis not present

## 2015-11-22 LAB — LIPID PANEL
CHOLESTEROL: 100 mg/dL (ref 0–200)
HDL: 42 mg/dL (ref >40)
LDL CALC: 46 mg/dL (ref 0–99)
Total CHOL/HDL Ratio: 2.4 RATIO
Triglycerides: 58 mg/dL (ref <150)
VLDL: 12 mg/dL (ref 0–40)

## 2015-11-22 LAB — BASIC METABOLIC PANEL
Anion gap: 11 (ref 5–15)
BUN: 12 mg/dL (ref 6–20)
CHLORIDE: 109 mmol/L (ref 101–111)
CO2: 22 mmol/L (ref 22–32)
CREATININE: 0.92 mg/dL (ref 0.44–1.00)
Calcium: 8.9 mg/dL (ref 8.9–10.3)
GFR calc non Af Amer: >60 mL/min (ref >60)
Glucose, Bld: 118 mg/dL — ABNORMAL HIGH (ref 65–99)
Potassium: 4.2 mmol/L (ref 3.5–5.1)
SODIUM: 142 mmol/L (ref 135–145)

## 2015-11-22 LAB — TROPONIN I
Troponin I: <0.03 ng/mL (ref <0.031)
Troponin I: <0.03 ng/mL (ref <0.031)

## 2015-11-22 MED ORDER — ISOSORBIDE MONONITRATE ER 30 MG PO TB24
30.0000 mg | ORAL_TABLET | Freq: Every day | ORAL | Status: DC
  Administered 2015-11-22: 30 mg via ORAL
  Filled 2015-11-22: qty 1

## 2015-11-22 MED ORDER — ISOSORBIDE MONONITRATE ER 30 MG PO TB24: 30.0000 mg | ORAL_TABLET | Freq: Every day | ORAL | Status: DC

## 2015-11-22 NOTE — Discharge Summary (Signed)
Discharge Summary    Patient ID: Tonya Bryant,  MRN: [id number], DOB/AGE: 1951-06-12 65 y.o.  Admit date: 11/21/2015 Discharge date: 11/22/2015  Primary Care Provider: Blair HeysEHINGER,ROBERT R Primary Cardiologist: Dr Mayford Knifeurner  Discharge Diagnoses    Principal Problem:   Chest pressure Active Problems:   HTN (hypertension)   Obesity   CAD (coronary artery disease), native coronary artery   Chest pain at rest   Allergies No Known Allergies  Diagnostic Studies/Procedures    CXR _____________   History of Present Illness     65 year old African-American female with history of CAD and recent implantation of drug-eluting stents in the mid circumflex and distal LAD. Residual 75% diagonal stenosis was also noted. She had prolonged neck and chest discomfort and came to the ER, where she was admitted for further evaluation and treatment.  Hospital Course     Consultants: none   Her cardiac enzymes were negative for MI. Her ECG had no acute changes. Her chest pain resolved and did not return. Her cardiac enzymes were negative for MI.   On 03/30, she was seen by Dr Katrinka BlazingSmith and all data were reviewed. She was ambulating without chest pain or SOB. She is considered stable for discharge, to follow up as an outpatient.   _____________  Discharge Vitals Blood pressure 135/88, pulse 78, temperature 98.2 F (36.8 C), temperature source Oral, resp. rate 17, height 5' (1.524 m), weight 203 lb 1.6 oz (92.126 kg), SpO2 97 %.  Filed Weights   11/21/15 1900 11/22/15 0557  Weight: 205 lb 4.8 oz (93.123 kg) 203 lb 1.6 oz (92.126 kg)  General: Well developed, well nourished, female in no acute distress Head: Eyes PERRLA, No xanthomas.   Normocephalic and atraumatic  Lungs: Clear bilaterally to auscultation. Heart: HRRR S1 S2, without MRG.  Pulses are 2+ & equal. No JVD. Abdomen: Bowel sounds are present, abdomen soft and non-tender without masses or  hernias noted. Msk: Normal strength and tone  for age. Extremities: No clubbing, cyanosis or edema.    Skin:  No rashes or lesions noted. Neuro: Alert and oriented X 3. Psych:  Good affect, responds appropriately   Labs & Radiologic Studies    CBC  Recent Labs  11/21/15 1238 11/21/15 1943  WBC 8.0 7.9  HGB 12.0 12.1  HCT 35.5* 37.3  MCV 95.7 95.4  PLT 261 284   Basic Metabolic Panel  Recent Labs  11/21/15 1943 11/22/15 0717  NA 140 142  K 3.7 4.2  CL 108 109  CO2 22 22  GLUCOSE 137* 118*  BUN 13 12  CREATININE 0.89 0.92  CALCIUM 9.3 8.9  MG 2.0  --    Liver Function Tests  Recent Labs  11/21/15 1943  AST 20  ALT 25  ALKPHOS 49  BILITOT 0.9  PROT 7.0  ALBUMIN 3.6   No results for input(s): LIPASE, AMYLASE in the last 72 hours. Cardiac Enzymes  Recent Labs  11/21/15 1943 11/22/15 0150 11/22/15 0717  TROPONINI <0.03 <0.03 <0.03   BNP Invalid input(s): POCBNP D-Dimer No results for input(s): DDIMER in the last 72 hours. Hemoglobin A1C No results for input(s): HGBA1C in the last 72 hours. Fasting Lipid Panel  Recent Labs  11/22/15 0151  CHOL 100  HDL 42  LDLCALC 46  TRIG 58  CHOLHDL 2.4   Thyroid Function Tests  Recent Labs  11/21/15 1943  TSH 1.803   _____________  Dg Chest 2 View 11/21/2015  CLINICAL DATA:  Chest pain  that began last night along with shortness of breath. EXAM: CHEST  2 VIEW COMPARISON:  10/31/2015 FINDINGS: The heart size and mediastinal contours are within normal limits. Both lungs are clear. The visualized skeletal structures are unremarkable. IMPRESSION: No active cardiopulmonary disease. Electronically Signed   By: Charlett Nose M.D.   On: 11/21/2015 13:06   Disposition   Pt is being discharged home today in good condition.  Follow-up Plans & Appointments    Follow-up Information    Follow up with [facility] On 01/07/2016.   Specialty:  Cardiology   Why:  Fasting lab work at [employer] am   Contact information:   [address]  [address] [place] [address] [phone]      Follow up with Quintella Reichert, MD On 02/07/2016.   Specialty:  Cardiology   Why:  See MD at 10:15 am, please arrive 15 minutes early for paperwork.   Contact information:   [address] [place] [address] [phone]      Discharge Instructions    Diet - low sodium heart healthy    Complete by:  As directed      Increase activity slowly    Complete by:  As directed            Discharge Medications   Current Discharge Medication List    START taking these medications   Details  isosorbide mononitrate (IMDUR) 30 MG 24 hr tablet Take 1 tablet (30 mg total) by mouth daily. Qty: 30 tablet, Refills: 11      CONTINUE these medications which have NOT CHANGED   Details  amLODipine (NORVASC) 10 MG tablet Take 1 tablet (10 mg total) by mouth daily. Qty: 90 tablet, Refills: 0    aspirin 81 MG tablet Take 1 tablet (81 mg total) by mouth daily. Qty: 90 tablet, Refills: 0    atorvastatin (LIPITOR) 80 MG tablet Take 1 tablet (80 mg total) by mouth daily at 6 PM. Qty: 90 tablet, Refills: 0    citalopram (CELEXA) 40 MG tablet Take 0.5 tablets (20 mg total) by mouth daily.    lisinopril (PRINIVIL,ZESTRIL) 20 MG tablet Take 1 tablet (20 mg total) by mouth daily. Qty: 30 tablet, Refills: 0    nitroGLYCERIN (NITROSTAT) 0.4 MG SL tablet Place 1 tablet (0.4 mg total) under the tongue every 5 (five) minutes as needed for chest pain (for 3 doses only). Qty: 25 tablet, Refills: 1    pantoprazole (PROTONIX) 40 MG tablet Take 1 tablet (40 mg total) by mouth daily at 12 noon. Qty: 90 tablet, Refills: 0    ticagrelor (BRILINTA) 90 MG TABS tablet Take 1 tablet (90 mg total) by mouth 2 (two) times daily. Qty: 180 tablet, Refills: 0      STOP taking these medications     diclofenac (VOLTAREN) 75 MG EC tablet           Outstanding Labs/Studies   None  Duration of Discharge Encounter   Greater than 30  minutes including physician time.  Melida Quitter NP 11/22/2015, 2:05 PM

## 2015-11-22 NOTE — Progress Notes (Addendum)
   W/U is negative.  If ambulates without pain will discharge and have f/u with Dr. Mayford Knifeurner 1 week.  Add imdur.

## 2015-11-22 NOTE — Progress Notes (Signed)
Discharge teaching and instructions reviewed. VSS. Pt educated on new medication. Pt has no further questions at this time. Discharging home with daughter.

## 2015-11-22 NOTE — Plan of Care (Signed)
Problem: Consults Goal: Chest Pain Patient Education (See Patient Education module for education specifics.)  Outcome: Progressing Pt ambulated the halls 600 ft. Pt denied any chest pain while ambulating. Pt tolerated walking well.

## 2015-11-23 ENCOUNTER — Encounter (HOSPITAL_COMMUNITY): Payer: Self-pay | Admitting: Family Medicine

## 2015-11-23 ENCOUNTER — Emergency Department (HOSPITAL_COMMUNITY)
Admission: EM | Admit: 2015-11-23 | Discharge: 2015-11-23 | Disposition: A | Discharge status: Home / Self Care | Payer: BLUE CROSS/BLUE SHIELD | Attending: Physician Assistant | Admitting: Physician Assistant

## 2015-11-23 ENCOUNTER — Emergency Department (HOSPITAL_COMMUNITY): Payer: BLUE CROSS/BLUE SHIELD

## 2015-11-23 DIAGNOSIS — Z79899 Other long term (current) drug therapy: Secondary | ICD-10-CM | POA: Insufficient documentation

## 2015-11-23 DIAGNOSIS — M542 Cervicalgia: Secondary | ICD-10-CM | POA: Diagnosis not present

## 2015-11-23 DIAGNOSIS — R0602 Shortness of breath: Secondary | ICD-10-CM | POA: Insufficient documentation

## 2015-11-23 DIAGNOSIS — I2510 Atherosclerotic heart disease of native coronary artery without angina pectoris: Secondary | ICD-10-CM | POA: Diagnosis not present

## 2015-11-23 DIAGNOSIS — R079 Chest pain, unspecified: Secondary | ICD-10-CM | POA: Diagnosis not present

## 2015-11-23 DIAGNOSIS — R509 Fever, unspecified: Secondary | ICD-10-CM | POA: Diagnosis not present

## 2015-11-23 DIAGNOSIS — I10 Essential (primary) hypertension: Secondary | ICD-10-CM | POA: Diagnosis not present

## 2015-11-23 DIAGNOSIS — F419 Anxiety disorder, unspecified: Secondary | ICD-10-CM | POA: Insufficient documentation

## 2015-11-23 DIAGNOSIS — Z7982 Long term (current) use of aspirin: Secondary | ICD-10-CM | POA: Diagnosis not present

## 2015-11-23 DIAGNOSIS — I25119 Atherosclerotic heart disease of native coronary artery with unspecified angina pectoris: Secondary | ICD-10-CM | POA: Diagnosis present

## 2015-11-23 DIAGNOSIS — R0789 Other chest pain: Secondary | ICD-10-CM

## 2015-11-23 DIAGNOSIS — Z9889 Other specified postprocedural states: Secondary | ICD-10-CM | POA: Insufficient documentation

## 2015-11-23 DIAGNOSIS — F329 Major depressive disorder, single episode, unspecified: Secondary | ICD-10-CM | POA: Insufficient documentation

## 2015-11-23 LAB — CBC
HEMATOCRIT: 33 % — AB (ref 36.0–46.0)
Hemoglobin: 10.8 g/dL — ABNORMAL LOW (ref 12.0–15.0)
MCH: 30.9 pg (ref 26.0–34.0)
MCHC: 32.7 g/dL (ref 30.0–36.0)
MCV: 94.6 fL (ref 78.0–100.0)
Platelets: 307 10*3/uL (ref 150–400)
RBC: 3.49 MIL/uL — AB (ref 3.87–5.11)
RDW: 12.5 % (ref 11.5–15.5)
WBC: 6 10*3/uL (ref 4.0–10.5)

## 2015-11-23 LAB — BASIC METABOLIC PANEL
ANION GAP: 11 (ref 5–15)
BUN: 14 mg/dL (ref 6–20)
CO2: 20 mmol/L — ABNORMAL LOW (ref 22–32)
Calcium: 9.3 mg/dL (ref 8.9–10.3)
Chloride: 108 mmol/L (ref 101–111)
Creatinine, Ser: 0.9 mg/dL (ref 0.44–1.00)
Glucose, Bld: 84 mg/dL (ref 65–99)
POTASSIUM: 3.9 mmol/L (ref 3.5–5.1)
SODIUM: 139 mmol/L (ref 135–145)

## 2015-11-23 LAB — I-STAT TROPONIN, ED
TROPONIN I, POC: 0 ng/mL (ref 0.00–0.08)
Troponin i, poc: 0 ng/mL (ref 0.00–0.08)

## 2015-11-23 NOTE — ED Notes (Signed)
Hager, PA stated patient could go home if her next trop was negative

## 2015-11-23 NOTE — H&P (Signed)
Tonya Bryant is an 65 y.o. female.   Chief Complaint: Chest Pain HPI:   65 year old African-American female with history of CAD and implantation of drug-eluting stents in the mid circumflex and distal LAD on 11/01/15. Residual 65% first diagonal stenosis was also noted. She was discharged on ASA, Brilinta, lipitor 80.  She had prolonged neck and chest discomfort and came to the ER again on 3/29, where she was admitted for further evaluation and treatment.  She ruled out for MI and EKG showed no acute changes.  Chest pain and neck pain resolved and she was discharged yesterday.  She presents again with posterior neck pain radiating down the trapezius and chest pain described as someone "stepping on her chest".  This occurred yesterday was associated with shortness of breath. No nausea vomiting or diaphoresis. Today she had a similar episode down the back of her neck and shoulders but no chest pain. She went to her family doctor and became dizzy and had more neck and shoulder pain. She also reports that she is severely decayed teeth that have been causing her jaw pain. She's neglected to tell anybody about these recently.  No fever or chills.    Post-Intervention Diagram          Past Medical History  Diagnosis Date  . Hypertension   . Anxiety   . Depression (emotion)   . Full dentures   . CAD (coronary artery disease), native coronary artery 11/02/2015    Ost RCA lesion, 30% stenosed, Prox LAD lesion, 50% stenosed, 1st Diag lesion, 65% stenosed, Ramus lesion, 50% stenosed, Mid Cx lesion, 80% stenosed, Dist LAD lesion, 75% stenosed. S/P DES to distal LAD and mid Cx 10/2015    Past Surgical History  Procedure Laterality Date  . Breast surgery      lt br bx-neg  . Toe osteotomy      rt big toe  . Tubal ligation    . Colonoscopy    . Tooth extraction    . Carpal tunnel release Left 07/07/2013    Procedure: LEFT CARPAL TUNNEL RELEASE;  Surgeon: Cammie Sickle., MD;  Location: [place];  Service: Orthopedics;  Laterality: Left;  . Trigger finger release Left 07/07/2013    Procedure: LEFT THUMB RELEASE A-1 PULLEY;  Surgeon: Cammie Sickle., MD;  Location: [place];  Service: Orthopedics;  Laterality: Left;  . Cardiac catheterization N/A 11/01/2015    Procedure: Left Heart Cath and Coronary Angiography;  Surgeon: Sherren Mocha, MD;  Location: [facility];  Service: Cardiovascular;  Laterality: N/A;    Family History  Problem Relation Age of Onset  . Cancer Mother   . Diabetes Mother   . Stroke Mother   . Hypertension Mother   . Stroke Father   . Diabetes Father   . Hypertension Father   . Gout Father    Social History:  reports that she has never smoked. She does not have any smokeless tobacco history on file. She reports that she drinks about 0.5 oz of alcohol per week. She reports that she does not use illicit drugs.  Allergies: No Known Allergies   (Not in a hospital admission)  Results for orders placed or performed during the hospital encounter of 11/23/15 (from the past 48 hour(s))  Basic metabolic panel     Status: Abnormal   Collection Time: 11/23/15  4:40 PM  Result Value Ref Range   Sodium 139 135 - 145 mmol/L  Potassium 3.9 3.5 - 5.1 mmol/L   Chloride 108 101 - 111 mmol/L   CO2 20 (L) 22 - 32 mmol/L   Glucose, Bld 84 65 - 99 mg/dL   BUN 14 6 - 20 mg/dL   Creatinine, Ser 0.90 0.44 - 1.00 mg/dL   Calcium 9.3 8.9 - 10.3 mg/dL   GFR calc non Af Amer >60 >60 mL/min   GFR calc Af Amer >60 >60 mL/min    Comment: (NOTE) The eGFR has been calculated using the CKD EPI equation. This calculation has not been validated in all clinical situations. eGFR's persistently <60 mL/min signify possible Chronic Kidney Disease.    Anion gap 11 5 - 15  CBC     Status: Abnormal   Collection Time: 11/23/15  4:40 PM  Result Value Ref Range   WBC 6.0 4.0 - 10.5 K/uL   RBC 3.49 (L) 3.87 - 5.11 MIL/uL   Hemoglobin 10.8 (L)  12.0 - 15.0 g/dL   HCT 33.0 (L) 36.0 - 46.0 %   MCV 94.6 78.0 - 100.0 fL   MCH 30.9 26.0 - 34.0 pg   MCHC 32.7 30.0 - 36.0 g/dL   RDW 12.5 11.5 - 15.5 %   Platelets 307 150 - 400 K/uL  I-stat troponin, ED (not at [facility], [facility])     Status: None   Collection Time: 11/23/15  4:40 PM  Result Value Ref Range   Troponin i, poc 0.00 0.00 - 0.08 ng/mL   Comment 3            Comment: Due to the release kinetics of cTnI, a negative result within the first hours of the onset of symptoms does not rule out myocardial infarction with certainty. If myocardial infarction is still suspected, repeat the test at appropriate intervals.    Dg Chest 2 View  11/23/2015  CLINICAL DATA:  Chest pressure, shortness of breath, bilateral shoulder pain for the past 3 weeks ; coronary stent placement on October 31, 2015. EXAM: CHEST  2 VIEW COMPARISON:  PA and lateral chest x-ray of November 21, 2015 FINDINGS: The lungs are well-expanded and clear. The heart and pulmonary vascularity are normal. The mediastinum is normal in width. There is no pleural effusion. The trachea is midline. There is tortuosity of the descending thoracic aorta. There is mild multilevel degenerative disc disease of the thoracic spine. IMPRESSION: There is no active cardiopulmonary disease. Electronically Signed   By: David  Bryant M.D.   On: 11/23/2015 17:04    Review of Systems  Constitutional: Negative for fever, chills and diaphoresis.  HENT: Negative for sore throat.   Respiratory: Positive for shortness of breath.   Cardiovascular: Positive for chest pain. Negative for palpitations, leg swelling and PND.  Gastrointestinal: Negative for nausea, vomiting, abdominal pain, blood in stool and melena.  Genitourinary: Negative for hematuria.  Musculoskeletal: Positive for neck pain.  Neurological: Positive for dizziness. Negative for weakness.  All other systems reviewed and are negative.   Blood pressure 137/78, pulse 65, temperature 98.4 F  (36.9 C), temperature source Oral, resp. rate 15, height 5' (1.524 m), weight 204 lb 9 oz (92.789 kg), SpO2 100 %. Physical Exam  Nursing note and vitals reviewed. Constitutional: She is oriented to person, place, and time. She appears well-developed. No distress.  Obese  HENT:  Head: Normocephalic and atraumatic.  Mouth/Throat: No oropharyngeal exudate.  Eyes: EOM are normal. Pupils are equal, round, and reactive to light. No scleral icterus.  Neck: Normal range of motion.  She seems to have some posterior tenderness with flexion of her neck posteriorly.  Cardiovascular: Normal rate, regular rhythm, S1 normal and S2 normal.   No murmur heard. Pulses:      Radial pulses are 2+ on the right side, and 2+ on the left side.       Dorsalis pedis pulses are 2+ on the right side, and 2+ on the left side.  Respiratory: Effort normal and breath sounds normal. She has no wheezes. She has no rales.  GI: Soft. Bowel sounds are normal. She exhibits no distension. There is no tenderness.  Musculoskeletal: She exhibits no edema.  Neurological: She is alert and oriented to person, place, and time. She exhibits normal muscle tone.  Skin: Skin is warm and dry.  Psychiatric: She has a normal mood and affect.     Assessment/Plan Musculoskeletal pain posterior neck Additionally the patient reports that part of her job is lifting up a heavy boxes and the pain started the evening after she returned to work.  It seems to be exacerbated with active motion against resistance.  He has no acute EKG changes troponin is negative. She is compliant with Brilinta. Chest x-ray shows no acute pulmonary disease.  Check standard troponin.  If it is negative DC home with PCP follow-up.  Tarri Fuller, PA-C 11/23/2015, 7:23 PM

## 2015-11-23 NOTE — Discharge Instructions (Signed)
We're happy to report that both of your enzymes are negative. Greatly lowers the chance that you would be having a heart attack. We are glad that you got see cardiology and they think it is safe that he return home and follow-up with them.

## 2015-11-23 NOTE — ED Provider Notes (Signed)
CSN: [id number]     Arrival date & time 11/23/15  1619 History   First MD Initiated Contact with Patient 11/23/15 1813     Chief Complaint  Patient presents with  . Chest Pain  . Dizziness     (Consider location/radiation/quality/duration/timing/severity/associated sxs/prior Treatment) HPI  Patient is a 65 year old female with history of coronary artery disease. Discharged by cardiology yesterday.Recent drug-eluting stent to mid circumflex and distal LAD. Residual 75% stenosis of diag.  She was discharged history. She did laundry last night and started developing this neck and chest pressure. It went away overnight and came back again this morning she went to her primary care physician. They called and EMS and sent her here to the emergency department for further evaluation. She's had intermittent dizziness and chest pressure and neck pain. This is similar to what happened prior to her last MI at the beginning of March.  Denies any pain radiating to her back. Denies any nausea vomiting diarrhea or urinary symptoms.  Past Medical History  Diagnosis Date  . Hypertension   . Anxiety   . Depression (emotion)   . Full dentures   . CAD (coronary artery disease), native coronary artery 11/02/2015    Ost RCA lesion, 30% stenosed, Prox LAD lesion, 50% stenosed, 1st Diag lesion, 65% stenosed, Ramus lesion, 50% stenosed, Mid Cx lesion, 80% stenosed, Dist LAD lesion, 75% stenosed. S/P DES to distal LAD and mid Cx 10/2015   Past Surgical History  Procedure Laterality Date  . Breast surgery      lt br bx-neg  . Toe osteotomy      rt big toe  . Tubal ligation    . Colonoscopy    . Tooth extraction    . Carpal tunnel release Left 07/07/2013    Procedure: LEFT CARPAL TUNNEL RELEASE;  Surgeon: Wyn Forster., MD;  Location: [facility];  Service: Orthopedics;  Laterality: Left;  . Trigger finger release Left 07/07/2013    Procedure: LEFT THUMB RELEASE A-1 PULLEY;  Surgeon:  Wyn Forster., MD;  [facility];  Service: Orthopedics;  Laterality: Left;  . Cardiac catheterization N/A 11/01/2015    Procedure: Left Heart Cath and Coronary Angiography;  Surgeon: Tonny Bollman, MD;  Location: [facility];  Service: Cardiovascular;  Laterality: N/A;   Family History  Problem Relation Age of Onset  . Cancer Mother   . Diabetes Mother   . Stroke Mother   . Hypertension Mother   . Stroke Father   . Diabetes Father   . Hypertension Father   . Gout Father    Social History  Substance Use Topics  . Smoking status: Never Smoker   . Smokeless tobacco: None  . Alcohol Use: 0.5 oz/week    1 drink(s) per week   OB History    No data available     Review of Systems  Constitutional: Positive for fever. Negative for activity change and fatigue.  HENT: Negative for congestion.   Eyes: Negative for discharge.  Respiratory: Positive for chest tightness and shortness of breath. Negative for cough.   Cardiovascular: Positive for chest pain.  Gastrointestinal: Negative for abdominal distention.  Genitourinary: Negative for dysuria.  Musculoskeletal: Negative for joint swelling.  Skin: Negative for rash.  Allergic/Immunologic: Negative for immunocompromised state.  Neurological: Negative for seizures.  Psychiatric/Behavioral: Negative for agitation.      Allergies  Review of patient's allergies indicates no known allergies.  Home Medications  Prior to Admission medications   Medication Sig Start Date End Date Taking? Authorizing Provider  amLODipine (NORVASC) 10 MG tablet Take 1 tablet (10 mg total) by mouth daily. 11/02/15  Yes Shanker Levora DredgeM Ghimire, MD  aspirin 81 MG tablet Take 1 tablet (81 mg total) by mouth daily. 11/02/15  Yes Shanker Levora DredgeM Ghimire, MD  atorvastatin (LIPITOR) 80 MG tablet Take 1 tablet (80 mg total) by mouth daily at 6 PM. 11/02/15  Yes Shanker Levora DredgeM Ghimire, MD  citalopram (CELEXA) 40 MG tablet Take 0.5 tablets (20 mg  total) by mouth daily. 11/02/15  Yes Shanker Levora DredgeM Ghimire, MD  isosorbide mononitrate (IMDUR) 30 MG 24 hr tablet Take 1 tablet (30 mg total) by mouth daily. 11/22/15  Yes Rhonda G Barrett, PA-C  lisinopril (PRINIVIL,ZESTRIL) 20 MG tablet Take 1 tablet (20 mg total) by mouth daily. 11/02/15  Yes Shanker Levora DredgeM Ghimire, MD  nitroGLYCERIN (NITROSTAT) 0.4 MG SL tablet Place 1 tablet (0.4 mg total) under the tongue every 5 (five) minutes as needed for chest pain (for 3 doses only). 11/14/15  Yes Azalee CourseHao Meng, PA  pantoprazole (PROTONIX) 40 MG tablet Take 1 tablet (40 mg total) by mouth daily at 12 noon. Patient taking differently: Take 40 mg by mouth daily at 6 PM.  11/02/15  Yes Shanker Levora DredgeM Ghimire, MD  ticagrelor (BRILINTA) 90 MG TABS tablet Take 1 tablet (90 mg total) by mouth 2 (two) times daily. Patient taking differently: Take 90 mg by mouth 2 (two) times daily. Morning and 6pm 11/02/15  Yes Shanker Levora DredgeM Ghimire, MD   BP 129/69 mmHg  Pulse 67  Temp(Src) 98.4 F (36.9 C) (Oral)  Resp 20  Ht 5' (1.524 m)  Wt 204 lb 9 oz (92.789 kg)  BMI 39.95 kg/m2  SpO2 98% Physical Exam  Constitutional: She is oriented to person, place, and time. She appears well-developed and well-nourished.  HENT:  Head: Normocephalic and atraumatic.  Eyes: Conjunctivae are normal. Right eye exhibits no discharge.  Neck: Neck supple.  Cardiovascular: Normal rate, regular rhythm and normal heart sounds.   No murmur heard. Pulmonary/Chest: Effort normal and breath sounds normal.  Abdominal: Soft. She exhibits no distension. There is no tenderness.  Musculoskeletal: Normal range of motion.  Neurological: She is oriented to person, place, and time. No cranial nerve deficit.  Skin: Skin is warm and dry. No rash noted. She is not diaphoretic.  Psychiatric: She has a normal mood and affect. Her behavior is normal.  Nursing note and vitals reviewed.   ED Course  Procedures (including critical care time) Labs Review Labs Reviewed  BASIC  METABOLIC PANEL - Abnormal; Notable for the following:    CO2 20 (*)    All other components within normal limits  CBC - Abnormal; Notable for the following:    RBC 3.49 (*)    Hemoglobin 10.8 (*)    HCT 33.0 (*)    All other components within normal limits  TROPONIN I  I-STAT TROPOININ, ED  I-STAT TROPOININ, ED    Imaging Review Dg Chest 2 View  11/23/2015  CLINICAL DATA:  Chest pressure, shortness of breath, bilateral shoulder pain for the past 3 weeks ; coronary stent placement on October 31, 2015. EXAM: CHEST  2 VIEW COMPARISON:  PA and lateral chest x-ray of November 21, 2015 FINDINGS: The lungs are well-expanded and clear. The heart and pulmonary vascularity are normal. The mediastinum is normal in width. There is no pleural effusion. The trachea is midline. There is tortuosity of  the descending thoracic aorta. There is mild multilevel degenerative disc disease of the thoracic spine. IMPRESSION: There is no active cardiopulmonary disease. Electronically Signed   By: David  [place] M.D.   On: 11/23/2015 17:04   I have personally reviewed and evaluated these images and lab results as part of my medical decision-making.   EKG Interpretation   Date/Time:  Friday November 23 2015 16:31:13 EDT Ventricular Rate:  79 PR Interval:  176 QRS Duration: 90 QT Interval:  386 QTC Calculation: 442 R Axis:   4 Text Interpretation:  Sinus rhythm with occasional Premature ventricular  complexes Cannot rule out Anterior infarct , age undetermined Abnormal ECG  No significant change since last tracing Confirmed by Kandis Mannan  [phone]) on 11/23/2015 6:38:33 PM      MDM   Final diagnoses:  None    Patient is a pleasant 65 year old female coronary artery disease. Patient had 2 DES stents placed recently. Recent admission to cardiology and discharged yesterday.  Doesn't sound typical for ischemic chest pain, but she has such a complex history, would like to defer to cardiology.   Patient  presenting with neck pain and heaviness in her chest. Initial EKG nonischemic, initial troponin negative. We will discuss with cardiology.  10:48 PM   Discussed with cardiology. Will discharge after delta trop.    Krystianna Soth Randall An, MD 11/23/15 2249

## 2015-11-23 NOTE — ED Notes (Signed)
Cardiology at bedside.

## 2015-11-23 NOTE — ED Notes (Signed)
Pt here for chest pain across her chest into her neck that was similar to when she was here prior. sts recent admission. sts dizziness.  sts stents back in march. Denies chest pain currentlt. sts this episode started while doing laundry.

## 2015-11-27 ENCOUNTER — Telehealth: Payer: Self-pay | Admitting: Cardiology

## 2015-11-27 NOTE — Telephone Encounter (Signed)
New message   S/p  Cath last month   Please advise - should patient wait 6 month after recent procedure.    1. What dental office are you calling from? Dr. Edmon CrapeJanna [facility]   2. What is your office phone and fax number? Fax # [phone] /  [phone]- phone   3. What type of procedure is the patient having performed? Cleaning, extraction, filling,    4. What date is procedure scheduled? Pending.    5. What is your question (ex. Antibiotics prior to procedure, holding medication-we need to know how long dentist wants pt to hold med)? md to advise

## 2015-11-28 NOTE — Telephone Encounter (Signed)
OK to proceed with cleaning and dental procedures 4 weeks post angioplasty but cannot be off Plavix for a year unless for emergent procedure.

## 2015-11-28 NOTE — Telephone Encounter (Signed)
Printed and faxed to [phone].

## 2015-12-13 ENCOUNTER — Encounter (HOSPITAL_COMMUNITY)
Admission: RE | Admit: 2015-12-13 | Discharge: 2015-12-13 | Disposition: A | Discharge status: Home / Self Care | Payer: BLUE CROSS/BLUE SHIELD | Source: Ambulatory Visit | Attending: Cardiology | Admitting: Cardiology

## 2015-12-13 VITALS — BP 140/76 | HR 86 | Ht 60.0 in | Wt 206.6 lb

## 2015-12-13 DIAGNOSIS — F329 Major depressive disorder, single episode, unspecified: Secondary | ICD-10-CM | POA: Diagnosis not present

## 2015-12-13 DIAGNOSIS — Z955 Presence of coronary angioplasty implant and graft: Secondary | ICD-10-CM

## 2015-12-13 DIAGNOSIS — I10 Essential (primary) hypertension: Secondary | ICD-10-CM | POA: Diagnosis not present

## 2015-12-13 DIAGNOSIS — Z7902 Long term (current) use of antithrombotics/antiplatelets: Secondary | ICD-10-CM | POA: Diagnosis not present

## 2015-12-13 DIAGNOSIS — Z79899 Other long term (current) drug therapy: Secondary | ICD-10-CM | POA: Diagnosis not present

## 2015-12-13 DIAGNOSIS — Z7982 Long term (current) use of aspirin: Secondary | ICD-10-CM | POA: Insufficient documentation

## 2015-12-13 DIAGNOSIS — F419 Anxiety disorder, unspecified: Secondary | ICD-10-CM | POA: Insufficient documentation

## 2015-12-13 DIAGNOSIS — I2510 Atherosclerotic heart disease of native coronary artery without angina pectoris: Secondary | ICD-10-CM | POA: Diagnosis not present

## 2015-12-13 NOTE — Progress Notes (Signed)
Cardiac Rehab Medication Review by a Pharmacist  Does the patient  feel that his/her medications are working for him/her?  yes  Has the patient been experiencing any side effects to the medications prescribed?  yes  Does the patient measure his/her own blood pressure or blood glucose at home?  no   Does the patient have any problems obtaining medications due to transportation or finances?   no  Understanding of regimen: good Understanding of indications: good Potential of compliance: good    Pharmacist comments: 65 y/o F presents to cardiac rehab in good spirits and no distress. Endorses an understanding of medication regimen and indications. Pt experienced some dizziness when starting this medication regimen but it has since resolved. Provided counseling on importance of checking blood pressure.   [place] PinkGazda, Harshita Bernales P, PharmD 12/13/2015 8:30 AM

## 2015-12-13 NOTE — Progress Notes (Signed)
Cardiac Individual Treatment Plan  Patient Details  Name: Tonya Bryant MRN: [id number] Date of Birth: [birth year] Referring Provider:        CARDI[facility] RE[facility]   Referring Provider  Dr. Gevena Cottonraci Turner,MD      Initial Encounter Date:       CARDIAC REHAB PHASE II [facility]ATION [facility]   Date  12/13/15   Referring Provider  Dr. Gevena Cottonraci Turner,MD      Visit Diagnosis: Stented coronary artery  Patient's Home Medications on Admission:  Current outpatient prescriptions:  .  amLODipine (NORVASC) 10 MG tablet, Take 1 tablet (10 mg total) by mouth daily., Disp: 90 tablet, Rfl: 0 .  aspirin 81 MG tablet, Take 1 tablet (81 mg total) by mouth daily., Disp: 90 tablet, Rfl: 0 .  atorvastatin (LIPITOR) 80 MG tablet, Take 1 tablet (80 mg total) by mouth daily at 6 PM., Disp: 90 tablet, Rfl: 0 .  citalopram (CELEXA) 40 MG tablet, Take 0.5 tablets (20 mg total) by mouth daily., Disp: , Rfl:  .  isosorbide mononitrate (IMDUR) 30 MG 24 hr tablet, Take 1 tablet (30 mg total) by mouth daily., Disp: 30 tablet, Rfl: 11 .  lisinopril (PRINIVIL,ZESTRIL) 20 MG tablet, Take 1 tablet (20 mg total) by mouth daily., Disp: 30 tablet, Rfl: 0 .  nitroGLYCERIN (NITROSTAT) 0.4 MG SL tablet, Place 1 tablet (0.4 mg total) under the tongue every 5 (five) minutes as needed for chest pain (for 3 doses only)., Disp: 25 tablet, Rfl: 1 .  pantoprazole (PROTONIX) 40 MG tablet, Take 1 tablet (40 mg total) by mouth daily at 12 noon. (Patient taking differently: Take 40 mg by mouth daily at 6 PM. ), Disp: 90 tablet, Rfl: 0 .  ticagrelor (BRILINTA) 90 MG TABS tablet, Take 1 tablet (90 mg total) by mouth 2 (two) times daily. (Patient taking differently: Take 90 mg by mouth 2 (two) times daily. Morning and 6pm), Disp: 180 tablet, Rfl: 0  Past Medical History: Past Medical History  Diagnosis Date  .  Hypertension   . Anxiety   . Depression (emotion)   . Full dentures   . CAD (coronary artery disease), native coronary artery 11/02/2015    Ost RCA lesion, 30% stenosed, Prox LAD lesion, 50% stenosed, 1st Diag lesion, 65% stenosed, Ramus lesion, 50% stenosed, Mid Cx lesion, 80% stenosed, Dist LAD lesion, 75% stenosed. 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 Never Smoker   Smokeless tobacco  . Not on file    Labs:     Recent Review Flowsheet Data    Labs for ITP Cardiac and Pulmonary Rehab Latest Ref Rng 08/30/2009 10/31/2015 11/01/2015 11/01/2015 11/22/2015   Cholestrol 0 - 200 mg/dL 562191 ATP III CLASSIFICATION: <200     mg/dL   Desirable 130-865200-239  mg/dL   Borderline High >=784>=240    mg/dL   High - 696194 295195 284100   LDLCALC 0 - 99 mg/dL 132110 Total Cholesterol/HDL:CHD Risk Coronary Heart Disease Risk Table Men   Women 1/2 Average Risk   3.4   3.3 Average Risk       5.0   4.4 2 X Average Risk   9.6   7.1 3 X Average Risk  23.4   11.0 Use the calculated Patient Ratio above and the CHD Risk Table to determine the patient's CHD  Risk. ATP III CLASSIFICATION (LDL): <100     mg/dL   Optimal 098-119  mg/dL   Near or Above Optimal 130-159  mg/dL   Borderline 147-829  mg/dL   High >562     mg/dL   Very High(H) - 130(Q) 126(H) 46   HDL >40 mg/dL 76 - 54 53 42   Trlycerides <150 mg/dL 24 - 68 81 58   Hemoglobin A1c 4.8 - 5.6 % - 5.8(H) - - -      Capillary Blood Glucose: No results found for: GLUCAP   Exercise Target Goals: Date: 12/13/15  Exercise Program Goal: Individual exercise prescription set with THRR, safety & activity barriers. Participant demonstrates ability to understand and report RPE using BORG scale, to self-measure pulse accurately, and to acknowledge the importance of the exercise prescription.  Exercise Prescription Goal: Starting with aerobic activity 30 plus minutes a day, 3 days per week for initial exercise prescription.  Provide home exercise prescription and guidelines that participant acknowledges understanding prior to discharge.  Activity Barriers & Risk Stratification:     Activity Barriers & Cardiac Risk Stratification - 12/13/15 0841    Activity Barriers & Cardiac Risk Stratification   Activity Barriers Other (comment)   Comments R sided shoulder and neck pain   Cardiac Risk Stratification High      6 Minute Walk:     6 Minute Walk      12/13/15 0935 12/13/15 1114     6 Minute Walk   Phase Initial     Distance 1703 feet     Walk Time 6 minutes     # of Rest Breaks 0     MPH 3.2     METS 3.4     RPE 13     VO2 Peak 11.9     Symptoms No     Resting HR 86 bpm     Resting BP 140/76 mmHg     Max Ex. HR 120 bpm     Max Ex. BP 128/60 mmHg     2 Minute Post BP  102/70 mmHg       Initial Exercise Prescription:     Initial Exercise Prescription - 12/13/15 1100    Date of Initial Exercise RX and Referring Provider   Date 12/13/15   Referring Provider Dr. Gevena Cotton   Treadmill   MPH 2.6   Grade 1   Minutes 10   METs 2.8   Bike   Level 0.7   Minutes 10   METs 2.5   NuStep   Level 3   Minutes 10   METs 2   Prescription Details   Frequency (times per week) 3   Duration Progress to 30 minutes of continuous aerobic without signs/symptoms of physical distress   Intensity   THRR 40-80% of Max Heartrate 63-126   Ratings of Perceived Exertion 11-13   Progression   Progression Continue to progress workloads to maintain intensity without signs/symptoms of physical distress.   Resistance Training   Training Prescription Yes   Weight 1   Reps 10-12      Perform Capillary Blood Glucose checks as needed.  Exercise Prescription Changes:   Exercise Comments:   Discharge Exercise Prescription (Final Exercise Prescription Changes):   Nutrition:  Target Goals: Understanding of nutrition guidelines, daily intake of sodium 1500mg , cholesterol 200mg , calories 30% from  fat and 7% or less from saturated fats, daily to have 5 or more servings of fruits and vegetables.  Biometrics:  Pre Biometrics - 12/13/15 1108    Pre Biometrics   Single Leg Stand 5.5 seconds       Nutrition Therapy Plan and Nutrition Goals:     Nutrition Therapy & Goals - 12/13/15 1010    Nutrition Therapy   Diet Therapeutic Lifestyle Changes   Personal Nutrition Goals   Personal Goal #1 Weight loss of 0.5-2 lb/week to a goal wt loss of 6-24 lb at graduation from Cardiac Rehab   Intervention Plan   Intervention Prescribe, educate and counsel regarding individualized specific dietary modifications aiming towards targeted core components such as weight, hypertension, lipid management, diabetes, heart failure and other comorbidities.   Expected Outcomes Short Term Goal: Understand basic principles of dietary content, such as calories, fat, sodium, cholesterol and nutrients.;Long Term Goal: Adherence to prescribed nutrition plan.      Nutrition Discharge: Nutrition Scores:   Nutrition Goals Re-Evaluation:   Psychosocial: Target Goals: Acknowledge presence or absence of depression, maximize coping skills, provide positive support system. Participant is able to verbalize types and ability to use techniques and skills needed for reducing stress and depression.  Initial Review & Psychosocial Screening:   Quality of Life Scores:     Quality of Life - 12/13/15 1314    Quality of Life Scores   Health/Function Pre 24.73 %   Socioeconomic Pre 25.5 %   Psych/Spiritual Pre 24.86 %   Family Pre 24 %   GLOBAL Pre 24.82 %      PHQ-9:     Recent Review Flowsheet Data    There is no flowsheet data to display.      Psychosocial Evaluation and Intervention:   Psychosocial Re-Evaluation:   Vocational Rehabilitation: Provide vocational rehab assistance to qualifying candidates.   Vocational Rehab Evaluation & Intervention:     Vocational Rehab - 12/13/15 1307     Initial Vocational Rehab Evaluation & Intervention   Assessment shows need for Vocational Rehabilitation Yes   Vocational Rehab Packet given to patient 12/13/15      Education: Education Goals: Education classes will be provided on a weekly basis, covering required topics. Participant will state understanding/return demonstration of topics presented.  Learning Barriers/Preferences:     Learning Barriers/Preferences - 12/13/15 1610    Learning Barriers/Preferences   Learning Barriers Sight   Learning Preferences Skilled Demonstration;Video;Written Material      Education Topics: Count Your Pulse:  -Group instruction provided by verbal instruction, demonstration, patient participation and written materials to support subject.  Instructors address importance of being able to find your pulse and how to count your pulse when at home without a heart monitor.  Patients get hands on experience counting their pulse with staff help and individually.   Heart Attack, Angina, and Risk Factor Modification:  -Group instruction provided by verbal instruction, video, and written materials to support subject.  Instructors address signs and symptoms of angina and heart attacks.    Also discuss risk factors for heart disease and how to make changes to improve heart health risk factors.   Functional Fitness:  -Group instruction provided by verbal instruction, demonstration, patient participation, and written materials to support subject.  Instructors address safety measures for doing things around the house.  Discuss how to get up and down off the floor, how to pick things up properly, how to safely get out of a chair without assistance, and balance training.   Meditation and Mindfulness:  -Group instruction provided by verbal instruction, patient participation, and written materials to support subject.  Instructor addresses importance of mindfulness and meditation practice to help reduce stress and  improve awareness.  Instructor also leads participants through a meditation exercise.    Stretching for Flexibility and Mobility:  -Group instruction provided by verbal instruction, patient participation, and written materials to support subject.  Instructors lead participants through series of stretches that are designed to increase flexibility thus improving mobility.  These stretches are additional exercise for major muscle groups that are typically performed during regular warm up and cool down.   Hands Only CPR Anytime:  -Group instruction provided by verbal instruction, video, patient participation and written materials to support subject.  Instructors co-teach with AHA video for hands only CPR.  Participants get hands on experience with mannequins.   Nutrition I class: Heart Healthy Eating:  -Group instruction provided by PowerPoint slides, verbal discussion, and written materials to support subject matter. The instructor gives an explanation and review of the Therapeutic Lifestyle Changes diet recommendations, which includes a discussion on lipid goals, dietary fat, sodium, fiber, plant stanol/sterol esters, sugar, and the components of a well-balanced, healthy diet.   Nutrition II class: Lifestyle Skills:  -Group instruction provided by PowerPoint slides, verbal discussion, and written materials to support subject matter. The instructor gives an explanation and review of label reading, grocery shopping for heart health, heart healthy recipe modifications, and ways to make healthier choices when eating out.   Diabetes Question & Answer:  -Group instruction provided by PowerPoint slides, verbal discussion, and written materials to support subject matter. The instructor gives an explanation and review of diabetes co-morbidities, pre- and post-prandial blood glucose goals, pre-exercise blood glucose goals, signs, symptoms, and treatment of hypoglycemia and hyperglycemia, and foot care  basics.   Diabetes Blitz:  -Group instruction provided by PowerPoint slides, verbal discussion, and written materials to support subject matter. The instructor gives an explanation and review of the physiology behind type 1 and type 2 diabetes, diabetes medications and rational behind using different medications, pre- and post-prandial blood glucose recommendations and Hemoglobin A1c goals, diabetes diet, and exercise including blood glucose guidelines for exercising safely.    Portion Distortion:  -Group instruction provided by PowerPoint slides, verbal discussion, written materials, and food models to support subject matter. The instructor gives an explanation of serving size versus portion size, changes in portions sizes over the last 20 years, and what consists of a serving from each food group.   Stress Management:  -Group instruction provided by verbal instruction, video, and written materials to support subject matter.  Instructors review role of stress in heart disease and how to cope with stress positively.     Exercising on Your Own:  -Group instruction provided by verbal instruction, power point, and written materials to support subject.  Instructors discuss benefits of exercise, components of exercise, frequency and intensity of exercise, and end points for exercise.  Also discuss use of nitroglycerin and activating EMS.  Review options of places to exercise outside of rehab.  Review guidelines for sex with heart disease.   Cardiac Drugs I:  -Group instruction provided by verbal instruction and written materials to support subject.  Instructor reviews cardiac drug classes: antiplatelets, anticoagulants, beta blockers, and statins.  Instructor discusses reasons, side effects, and lifestyle considerations for each drug class.   Cardiac Drugs II:  -Group instruction provided by verbal instruction and written materials to support subject.  Instructor reviews cardiac drug classes:  angiotensin converting enzyme inhibitors (ACE-I), angiotensin II receptor blockers (ARBs), nitrates, and calcium channel blockers.  Instructor discusses reasons, side effects, and lifestyle considerations for each drug class.   Anatomy and Physiology of the Circulatory System:  -Group instruction provided by verbal instruction, video, and written materials to support subject.  Reviews functional anatomy of heart, how it relates to various diagnoses, and what role the heart plays in the overall system.   Knowledge Questionnaire Score:     Knowledge Questionnaire Score - 12/13/15 1118    Knowledge Questionnaire Score   Pre Score 20/24      Core Components/Risk Factors/Patient Goals at Admission:     Personal Goals and Risk Factors at Admission - 12/13/15 0845    Core Components/Risk Factors/Patient Goals on Admission    Weight Management Obesity;Weight Loss;Yes   Intervention Weight Management: Develop a combined nutrition and exercise program designed to reach desired caloric intake, while maintaining appropriate intake of nutrient and fiber, sodium and fats, and appropriate energy expenditure required for the weight goal.;Weight Management: Provide education and appropriate resources to help participant work on and attain dietary goals.;Weight Management/Obesity: Establish reasonable short term and long term weight goals.;Obesity: Provide education and appropriate resources to help participant work on and attain dietary goals.   Expected Outcomes Short Term: Continue to assess and modify interventions until short term weight is achieved;Long Term: Adherence to nutrition and physical activity/exercise program aimed toward attainment of established weight goal;Understanding of distribution of calorie intake throughout the day with the consumption of 4-5 meals/snacks;Weight Loss: Understanding of general recommendations for a balanced deficit meal plan, which promotes 1-2 lb weight loss per week  and includes a negative energy balance of [phone] kcal/d;Understanding recommendations for meals to include 15-35% energy as protein, 25-35% energy from fat, 35-60% energy from carbohydrates, less than 200mg  of dietary cholesterol, 20-35 gm of total fiber daily   Increase Strength and Stamina Yes   Improve shortness of breath with ADL's Yes   Intervention Provide education, individualized exercise plan and daily activity instruction to help decrease symptoms of SOB with activities of daily living.   Expected Outcomes Short Term: Achieves a reduction of symptoms when performing activities of daily living.   Personal Goal Other Yes   Personal Goal Lose 8-10lbs, help with nutrition and diet, increase strength and stamina   Intervention increase exercise capacity, decr. caloric intake    Expected Outcomes Better nutrtion and improved exercise tolerance and endurance      Core Components/Risk Factors/Patient Goals Review:      Goals and Risk Factor Review      12/13/15 0845           Core Components/Risk Factors/Patient Goals Review   Personal Goals Review Weight Management/Obesity;Increase Strength and Stamina;Other          Core Components/Risk Factors/Patient Goals at Discharge (Final Review):      Goals and Risk Factor Review - 12/13/15 0845    Core Components/Risk Factors/Patient Goals Review   Personal Goals Review Weight Management/Obesity;Increase Strength and Stamina;Other      ITP Comments:     ITP Comments      12/11/15 1000 12/13/15 0934         ITP Comments Dr. Armanda Magic, Medical Director Dr. Armanda Magic, Medical Director         Comments: Pt in today for cardiac rehab orientation.  As a part of the orientation to cardiac rehab, pt completed a 6 minute walk test.  Monitor showed SR with occ PVC.  This is present in pt most recent 12 lead ekg.  Pt  tolerated ambulation well with some complaints of lower back pain which is chronic in nature. Pt expressed  interest in talking with a Magazine features editor.  Pt given the vocational rehab packet to complete and return to cardiac rehab. Pt scheduled to begin full exercise on Monday April 24th. Alanson Aly, BSN

## 2015-12-17 ENCOUNTER — Encounter (HOSPITAL_COMMUNITY): Payer: Self-pay

## 2015-12-17 ENCOUNTER — Encounter (HOSPITAL_COMMUNITY)
Admission: RE | Admit: 2015-12-17 | Discharge: 2015-12-17 | Disposition: A | Discharge status: Home / Self Care | Payer: BLUE CROSS/BLUE SHIELD | Source: Ambulatory Visit | Attending: Cardiology | Admitting: Cardiology

## 2015-12-17 DIAGNOSIS — Z955 Presence of coronary angioplasty implant and graft: Secondary | ICD-10-CM | POA: Diagnosis not present

## 2015-12-17 NOTE — Progress Notes (Signed)
Daily Session Note  Patient Details  Name: Tonya Bryant MRN: [id number] Date of Birth: 09/21/1950 Referring Provider:        CARDIAC REHAB PHASE II ORIENTATION from 12/13/2015 in [place]   Referring Provider  Dr. Vonna Drafts      Encounter Date: 12/17/2015  Check In:     Session Check In - 12/17/15 0834    Check-In   Location [facility]   Staff Present Maurice Small, RN, BSN;Amber Fair, MS, ACSM RCEP, Exercise Physiologist;Jessica [place], MA, ACSM RCEP, Exercise Physiologist;Sharyl Panchal, RN, BSN   Supervising physician immediately available to respond to emergencies [facility] immediately available   Physician(s) Dr. Marily Memos   Medication changes reported     No   Fall or balance concerns reported    No   Warm-up and Cool-down Performed as group-led instruction   Resistance Training Performed Yes   VAD Patient? No   Pain Assessment   Currently in Pain? No/denies   Multiple Pain Sites No      Capillary Blood Glucose: No results found for this or any previous visit (from the past 24 hour(s)).   Goals Met:  Exercise tolerated well  Goals Unmet:  Not Applicable  Comments: Pt started cardiac rehab today.  Pt tolerated light exercise without difficulty. VSS, telemetry-sinus , asymptomatic.  Medication list reconciled. Pt denies barriers to medication compliance,  Although reports she anticipates having difficulty affording brilinta. Pt given brilinta financial assistance information.  pt will contact them and   Will contact office for samples. PSYCHOSOCIAL ASSESSMENT:  PHQ-10, pt has history of depression currently being treated with celexa, per PCP. Pt has upcoming PCP appt and encouraged to discuss depression symptoms and sleep concerns at that time.  Pt reports most of her current depression is due to health related anxiety and social isolation.  Pt is not currently working. Vocational rehab packet given.  Pt  also given information about heart sister support group.  Pt exhibits positive coping skills, hopeful outlook with supportive family. No psychosocial needs identified at this time, no psychosocial interventions necessary.    Pt enjoys traveling, socializing, and walking.    Pt oriented to exercise equipment and routine.    Understanding verbalized.   Dr. Fransico Him is Medical Director for Cardiac Rehab at [facility].

## 2015-12-18 NOTE — Progress Notes (Signed)
Cardiac Individual Treatment Plan  Patient Details  Name: Tonya Bryant MRN: [id number] Date of Birth: [birth year] Referring Provider:        CARDIAC REHAB PHASE [facility]   Referring Provider  Dr. Gevena Cottonraci Turner,MD      Initial Encounter Date:       CARDIAC REHAB PHASE II ORIENTATION [facility]13[facility]   Date  12/13/15   Referring Provider  Dr. Gevena Cottonraci Turner,MD      Visit Diagnosis: Stented coronary artery  Patient's Home Medications on Admission:  Current outpatient prescriptions:  .  amLODipine (NORVASC) 10 MG tablet, Take 1 tablet (10 mg total) by mouth daily., Disp: 90 tablet, Rfl: 0 .  aspirin 81 MG tablet, Take 1 tablet (81 mg total) by mouth daily., Disp: 90 tablet, Rfl: 0 .  atorvastatin (LIPITOR) 80 MG tablet, Take 1 tablet (80 mg total) by mouth daily at 6 PM., Disp: 90 tablet, Rfl: 0 .  citalopram (CELEXA) 40 MG tablet, Take 0.5 tablets (20 mg total) by mouth daily., Disp: , Rfl:  .  isosorbide mononitrate (IMDUR) 30 MG 24 hr tablet, Take 1 tablet (30 mg total) by mouth daily., Disp: 30 tablet, Rfl: 11 .  lisinopril (PRINIVIL,ZESTRIL) 20 MG tablet, Take 1 tablet (20 mg total) by mouth daily., Disp: 30 tablet, Rfl: 0 .  nitroGLYCERIN (NITROSTAT) 0.4 MG SL tablet, Place 1 tablet (0.4 mg total) under the tongue every 5 (five) minutes as needed for chest pain (for 3 doses only)., Disp: 25 tablet, Rfl: 1 .  pantoprazole (PROTONIX) 40 MG tablet, Take 1 tablet (40 mg total) by mouth daily at 12 noon. (Patient taking differently: Take 40 mg by mouth daily at 6 PM. ), Disp: 90 tablet, Rfl: 0 .  ticagrelor (BRILINTA) 90 MG TABS tablet, Take 1 tablet (90 mg total) by mouth 2 (two) times daily. (Patient taking differently: Take 90 mg by mouth 2 (two) times daily. Morning and 6pm), Disp: 180 tablet, Rfl: 0  Past Medical History: Past Medical History  Diagnosis Date  .  Hypertension   . Anxiety   . Depression (emotion)   . Full dentures   . CAD (coronary artery disease), native coronary artery 11/02/2015    Ost RCA lesion, 30% stenosed, Prox LAD lesion, 50% stenosed, 1st Diag lesion, 65% stenosed, Ramus lesion, 50% stenosed, Mid Cx lesion, 80% stenosed, Dist LAD lesion, 75% stenosed. 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 Never Smoker   Smokeless tobacco  . Not on file    Labs:     Recent Review Flowsheet Data    Labs for ITP Cardiac and Pulmonary Rehab Latest Ref Rng 08/30/2009 10/31/2015 11/01/2015 11/01/2015 11/22/2015   Cholestrol 0 - 200 mg/dL 562191 ATP III CLASSIFICATION: <200     mg/dL   Desirable 130-865200-239  mg/dL   Borderline High >=784>=240    mg/dL   High - 696194 295195 284100   LDLCALC 0 - 99 mg/dL 132110 Total Cholesterol/HDL:CHD Risk Coronary Heart Disease Risk Table Men   Women 1/2 Average Risk   3.4   3.3 Average Risk       5.0   4.4 2 X Average Risk   9.6   7.1 3 X Average Risk  23.4   11.0 Use the calculated Patient Ratio above and the CHD Risk Table to determine the patient's CHD  Risk. ATP III CLASSIFICATION (LDL): <100     mg/dL   Optimal 536-644  mg/dL   Near or Above Optimal 130-159  mg/dL   Borderline 034-742  mg/dL   High >595     mg/dL   Very High(H) - 638(V) 126(H) 46   HDL >40 mg/dL 76 - 54 53 42   Trlycerides <150 mg/dL 24 - 68 81 58   Hemoglobin A1c 4.8 - 5.6 % - 5.8(H) - - -      Capillary Blood Glucose: No results found for: GLUCAP   Exercise Target Goals:    Exercise Program Goal: Individual exercise prescription set with THRR, safety & activity barriers. Participant demonstrates ability to understand and report RPE using BORG scale, to self-measure pulse accurately, and to acknowledge the importance of the exercise prescription.  Exercise Prescription Goal: Starting with aerobic activity 30 plus minutes a day, 3 days per week for initial exercise prescription. Provide home  exercise prescription and guidelines that participant acknowledges understanding prior to discharge.  Activity Barriers & Risk Stratification:     Activity Barriers & Cardiac Risk Stratification - 12/13/15 0841    Activity Barriers & Cardiac Risk Stratification   Activity Barriers Other (comment)   Comments R sided shoulder and neck pain   Cardiac Risk Stratification High      6 Minute Walk:     6 Minute Walk      12/13/15 0935 12/13/15 1114     6 Minute Walk   Phase Initial     Distance 1703 feet     Walk Time 6 minutes     # of Rest Breaks 0     MPH 3.2     METS 3.4     RPE 13     VO2 Peak 11.9     Symptoms No     Resting HR 86 bpm     Resting BP 140/76 mmHg     Max Ex. HR 120 bpm     Max Ex. BP 128/60 mmHg     2 Minute Post BP  102/70 mmHg       Initial Exercise Prescription:     Initial Exercise Prescription - 12/13/15 1100    Date of Initial Exercise RX and Referring Provider   Date 12/13/15   Referring Provider Dr. Gevena Cotton   Treadmill   MPH 2.6   Grade 1   Minutes 10   METs 2.8   Bike   Level 0.7   Minutes 10   METs 2.5   NuStep   Level 3   Minutes 10   METs 2   Prescription Details   Frequency (times per week) 3   Duration Progress to 30 minutes of continuous aerobic without signs/symptoms of physical distress   Intensity   THRR 40-80% of Max Heartrate 63-126   Ratings of Perceived Exertion 11-13   Progression   Progression Continue to progress workloads to maintain intensity without signs/symptoms of physical distress.   Resistance Training   Training Prescription Yes   Weight 1   Reps 10-12      Perform Capillary Blood Glucose checks as needed.  Exercise Prescription Changes:      Exercise Prescription Changes      12/20/15 1000           Recumbant Bike   Level 2       Minutes 10       METs 2  Exercise Comments:      Exercise Comments      12/20/15 1024           Exercise Comments pt is  responding to exercises well, changed equipment due to rec. bike being more comfortable and a  better fit. There are no exercise progressions demonstrated at this time, will continue to monitor          Discharge Exercise Prescription (Final Exercise Prescription Changes):     Exercise Prescription Changes - 12/20/15 1000    Recumbant Bike   Level 2   Minutes 10   METs 2      Nutrition:  Target Goals: Understanding of nutrition guidelines, daily intake of sodium 1500mg , cholesterol 200mg , calories 30% from fat and 7% or less from saturated fats, daily to have 5 or more servings of fruits and vegetables.  Biometrics:     Pre Biometrics - 12/13/15 1108    Pre Biometrics   Single Leg Stand 5.5 seconds       Nutrition Therapy Plan and Nutrition Goals:     Nutrition Therapy & Goals - 12/13/15 1010    Nutrition Therapy   Diet Therapeutic Lifestyle Changes   Personal Nutrition Goals   Personal Goal #1 Weight loss of 0.5-2 lb/week to a goal wt loss of 6-24 lb at graduation from Cardiac Rehab   Intervention Plan   Intervention Prescribe, educate and counsel regarding individualized specific dietary modifications aiming towards targeted core components such as weight, hypertension, lipid management, diabetes, heart failure and other comorbidities.   Expected Outcomes Short Term Goal: Understand basic principles of dietary content, such as calories, fat, sodium, cholesterol and nutrients.;Long Term Goal: Adherence to prescribed nutrition plan.      Nutrition Discharge: Nutrition Scores:     Nutrition Assessments - 12/19/15 1041    MEDFICTS Scores   Pre Score 6  will verify score with pt      Nutrition Goals Re-Evaluation:   Psychosocial: Target Goals: Acknowledge presence or absence of depression, maximize coping skills, provide positive support system. Participant is able to verbalize types and ability to use techniques and skills needed for reducing stress and  depression.  Initial Review & Psychosocial Screening:     Initial Psych Review & Screening - 12/17/15 1240    Initial Review   Current issues with Current Depression;Current Sleep Concerns   Family Dynamics   Good Support System? Yes   Comments daughter and friends    Barriers   Psychosocial barriers to participate in program The patient should benefit from training in stress management and relaxation.   Screening Interventions   Interventions Encouraged to exercise;Other (comment)   Comments encouraged to discuss depression symptoms and sleep concerns with her PCP      Quality of Life Scores:     Quality of Life - 12/13/15 1314    Quality of Life Scores   Health/Function Pre 24.73 %   Socioeconomic Pre 25.5 %   Psych/Spiritual Pre 24.86 %   Family Pre 24 %   GLOBAL Pre 24.82 %      PHQ-9:     Recent Review Flowsheet Data    Depression screen [facility] 2/9 12/17/2015   Decreased Interest 1   Down, Depressed, Hopeless 1   PHQ - 2 Score 2   Altered sleeping 3   Tired, decreased energy 2   Change in appetite 2   Feeling bad or failure about yourself  1   Trouble concentrating 0   Moving  slowly or fidgety/restless 0   Suicidal thoughts 0   PHQ-9 Score 10   Difficult doing work/chores Somewhat difficult      Psychosocial Evaluation and Intervention:   Psychosocial Re-Evaluation:   Vocational Rehabilitation: Provide vocational rehab assistance to qualifying candidates.   Vocational Rehab Evaluation & Intervention:     Vocational Rehab - 12/17/15 1508    Initial Vocational Rehab Evaluation & Intervention   Assessment shows need for Vocational Rehabilitation Yes   Vocational Rehab Packet given to patient 12/17/15      Education: Education Goals: Education classes will be provided on a weekly basis, covering required topics. Participant will state understanding/return demonstration of topics presented.  Learning Barriers/Preferences:     Learning  Barriers/Preferences - 12/13/15 1610    Learning Barriers/Preferences   Learning Barriers Sight   Learning Preferences Skilled Demonstration;Video;Written Material      Education Topics: Count Your Pulse:  -Group instruction provided by verbal instruction, demonstration, patient participation and written materials to support subject.  Instructors address importance of being able to find your pulse and how to count your pulse when at home without a heart monitor.  Patients get hands on experience counting their pulse with staff help and individually.   Heart Attack, Angina, and Risk Factor Modification:  -Group instruction provided by verbal instruction, video, and written materials to support subject.  Instructors address signs and symptoms of angina and heart attacks.    Also discuss risk factors for heart disease and how to make changes to improve heart health risk factors.   Functional Fitness:  -Group instruction provided by verbal instruction, demonstration, patient participation, and written materials to support subject.  Instructors address safety measures for doing things around the house.  Discuss how to get up and down off the floor, how to pick things up properly, how to safely get out of a chair without assistance, and balance training.   Meditation and Mindfulness:  -Group instruction provided by verbal instruction, patient participation, and written materials to support subject.  Instructor addresses importance of mindfulness and meditation practice to help reduce stress and improve awareness.  Instructor also leads participants through a meditation exercise.    Stretching for Flexibility and Mobility:  -Group instruction provided by verbal instruction, patient participation, and written materials to support subject.  Instructors lead participants through series of stretches that are designed to increase flexibility thus improving mobility.  These stretches are additional  exercise for major muscle groups that are typically performed during regular warm up and cool down.   Hands Only CPR Anytime:  -Group instruction provided by verbal instruction, video, patient participation and written materials to support subject.  Instructors co-teach with AHA video for hands only CPR.  Participants get hands on experience with mannequins.   Nutrition I class: Heart Healthy Eating:  -Group instruction provided by PowerPoint slides, verbal discussion, and written materials to support subject matter. The instructor gives an explanation and review of the Therapeutic Lifestyle Changes diet recommendations, which includes a discussion on lipid goals, dietary fat, sodium, fiber, plant stanol/sterol esters, sugar, and the components of a well-balanced, healthy diet.   Nutrition II class: Lifestyle Skills:  -Group instruction provided by PowerPoint slides, verbal discussion, and written materials to support subject matter. The instructor gives an explanation and review of label reading, grocery shopping for heart health, heart healthy recipe modifications, and ways to make healthier choices when eating out.   Diabetes Question & Answer:  -Group instruction provided by PowerPoint slides, verbal discussion,  and written materials to support subject matter. The instructor gives an explanation and review of diabetes co-morbidities, pre- and post-prandial blood glucose goals, pre-exercise blood glucose goals, signs, symptoms, and treatment of hypoglycemia and hyperglycemia, and foot care basics.   Diabetes Blitz:  -Group instruction provided by PowerPoint slides, verbal discussion, and written materials to support subject matter. The instructor gives an explanation and review of the physiology behind type 1 and type 2 diabetes, diabetes medications and rational behind using different medications, pre- and post-prandial blood glucose recommendations and Hemoglobin A1c goals, diabetes diet,  and exercise including blood glucose guidelines for exercising safely.    Portion Distortion:  -Group instruction provided by PowerPoint slides, verbal discussion, written materials, and food models to support subject matter. The instructor gives an explanation of serving size versus portion size, changes in portions sizes over the last 20 years, and what consists of a serving from each food group.   Stress Management:  -Group instruction provided by verbal instruction, video, and written materials to support subject matter.  Instructors review role of stress in heart disease and how to cope with stress positively.     Exercising on Your Own:  -Group instruction provided by verbal instruction, power point, and written materials to support subject.  Instructors discuss benefits of exercise, components of exercise, frequency and intensity of exercise, and end points for exercise.  Also discuss use of nitroglycerin and activating EMS.  Review options of places to exercise outside of rehab.  Review guidelines for sex with heart disease.   Cardiac Drugs I:  -Group instruction provided by verbal instruction and written materials to support subject.  Instructor reviews cardiac drug classes: antiplatelets, anticoagulants, beta blockers, and statins.  Instructor discusses reasons, side effects, and lifestyle considerations for each drug class.   Cardiac Drugs II:  -Group instruction provided by verbal instruction and written materials to support subject.  Instructor reviews cardiac drug classes: angiotensin converting enzyme inhibitors (ACE-I), angiotensin II receptor blockers (ARBs), nitrates, and calcium channel blockers.  Instructor discusses reasons, side effects, and lifestyle considerations for each drug class.   Anatomy and Physiology of the Circulatory System:  -Group instruction provided by verbal instruction, video, and written materials to support subject.  Reviews functional anatomy of  heart, how it relates to various diagnoses, and what role the heart plays in the overall system.   Knowledge Questionnaire Score:     Knowledge Questionnaire Score - 12/13/15 1118    Knowledge Questionnaire Score   Pre Score 20/24      Core Components/Risk Factors/Patient Goals at Admission:     Personal Goals and Risk Factors at Admission - 12/13/15 0845    Core Components/Risk Factors/Patient Goals on Admission    Weight Management Obesity;Weight Loss;Yes   Intervention Weight Management: Develop a combined nutrition and exercise program designed to reach desired caloric intake, while maintaining appropriate intake of nutrient and fiber, sodium and fats, and appropriate energy expenditure required for the weight goal.;Weight Management: Provide education and appropriate resources to help participant work on and attain dietary goals.;Weight Management/Obesity: Establish reasonable short term and long term weight goals.;Obesity: Provide education and appropriate resources to help participant work on and attain dietary goals.   Expected Outcomes Short Term: Continue to assess and modify interventions until short term weight is achieved;Long Term: Adherence to nutrition and physical activity/exercise program aimed toward attainment of established weight goal;Understanding of distribution of calorie intake throughout the day with the consumption of 4-5 meals/snacks;Weight Loss: Understanding of general recommendations  for a balanced deficit meal plan, which promotes 1-2 lb weight loss per week and includes a negative energy balance of [phone] kcal/d;Understanding recommendations for meals to include 15-35% energy as protein, 25-35% energy from fat, 35-60% energy from carbohydrates, less than 200mg  of dietary cholesterol, 20-35 gm of total fiber daily   Increase Strength and Stamina Yes   Improve shortness of breath with ADL's Yes   Intervention Provide education, individualized exercise plan and  daily activity instruction to help decrease symptoms of SOB with activities of daily living.   Expected Outcomes Short Term: Achieves a reduction of symptoms when performing activities of daily living.   Personal Goal Other Yes   Personal Goal Lose 8-10lbs, help with nutrition and diet, increase strength and stamina   Intervention increase exercise capacity, decr. caloric intake    Expected Outcomes Better nutrtion and improved exercise tolerance and endurance      Core Components/Risk Factors/Patient Goals Review:      Goals and Risk Factor Review      12/13/15 0845           Core Components/Risk Factors/Patient Goals Review   Personal Goals Review Weight Management/Obesity;Increase Strength and Stamina;Other          Core Components/Risk Factors/Patient Goals at Discharge (Final Review):      Goals and Risk Factor Review - 12/13/15 0845    Core Components/Risk Factors/Patient Goals Review   Personal Goals Review Weight Management/Obesity;Increase Strength and Stamina;Other      ITP Comments:     ITP Comments      12/11/15 1000 12/13/15 0934         ITP Comments Dr. Armanda Magic, Medical Director Dr. Armanda Magic, Medical Director         Comments: Pt is making expected progress toward personal goals after completing 3 sessions. Recommend continued exercise and life style modification education including  stress management and relaxation techniques to decrease cardiac risk profile.

## 2015-12-19 ENCOUNTER — Encounter (HOSPITAL_COMMUNITY)
Admission: RE | Admit: 2015-12-19 | Discharge: 2015-12-19 | Disposition: A | Discharge status: Home / Self Care | Payer: BLUE CROSS/BLUE SHIELD | Source: Ambulatory Visit | Attending: Cardiology | Admitting: Cardiology

## 2015-12-19 DIAGNOSIS — Z955 Presence of coronary angioplasty implant and graft: Secondary | ICD-10-CM | POA: Diagnosis not present

## 2015-12-21 ENCOUNTER — Encounter (HOSPITAL_COMMUNITY)
Admission: RE | Admit: 2015-12-21 | Discharge: 2015-12-21 | Disposition: A | Discharge status: Home / Self Care | Payer: BLUE CROSS/BLUE SHIELD | Source: Ambulatory Visit | Attending: Cardiology | Admitting: Cardiology

## 2015-12-21 DIAGNOSIS — Z955 Presence of coronary angioplasty implant and graft: Secondary | ICD-10-CM | POA: Diagnosis not present

## 2015-12-21 NOTE — Progress Notes (Signed)
Tonya Bryant 65 y.o. female Nutrition Note Spoke with pt. Pt is obese. Pt working toward losing wt by changing her diet and exercising. Pt is pleased with the results she is seeing on the scale at this time. Nutrition Survey reviewed with pt. Pt is following Step 2 of the Therapeutic Lifestyle Changes diet. Pt is pre-diabetic according to her last A1c. Pt was unaware of pre-diabetes and reports a family h/o DM. Pre-diabetes discussed. Pt encouraged to discuss pre-diabetes with her PCP further. Pt expressed difficulty buying food on nutrition screen. Per discussion, pt is having difficulty buying food. Food resources discussed. Pt expressed understanding of the information reviewed. Pt aware of nutrition education classes offered. Lab Results  Component Value Date   HGBA1C 5.8* 10/31/2015   Wt Readings from Last 3 Encounters:  12/13/15 206 lb 9.1 oz (93.7 kg)  11/23/15 204 lb 9 oz (92.789 kg)  11/22/15 203 lb 1.6 oz (92.126 kg)   Nutrition Diagnosis ? Food-and nutrition-related knowledge deficit related to lack of exposure to information as related to diagnosis of: ? CVD ? Pre-DM ? Obesity related to excessive energy intake as evidenced by a BMI of 40.4 Nutrition Intervention ? Benefits of adopting Therapeutic Lifestyle Changes discussed when Medficts reviewed. ? Pt to attend the Portion Distortion class ? Pt to attend the  ? Nutrition I class                      ? Nutrition II class ? Pt given handouts for: ? Pre-diabetes ? 5 day, 1500 kcal menu ideas ? Continue client-centered nutrition education by RD, as part of interdisciplinary care.  Goal(s) ? Pt to identify food quantities necessary to achieve weight loss of 6-24 lb (2.7-10.9 kg) at graduation from cardiac rehab.   Monitor and Evaluate progress toward nutrition goal with team.  Mickle PlumbEdna Tina Gruner, M.Ed, RD, LDN, CDE 12/21/2015 10:18 AM

## 2015-12-24 ENCOUNTER — Encounter (HOSPITAL_COMMUNITY)
Admission: RE | Admit: 2015-12-24 | Discharge: 2015-12-24 | Disposition: A | Discharge status: Home / Self Care | Payer: BLUE CROSS/BLUE SHIELD | Source: Ambulatory Visit | Attending: Cardiology | Admitting: Cardiology

## 2015-12-24 DIAGNOSIS — Z7982 Long term (current) use of aspirin: Secondary | ICD-10-CM | POA: Diagnosis not present

## 2015-12-24 DIAGNOSIS — F329 Major depressive disorder, single episode, unspecified: Secondary | ICD-10-CM | POA: Diagnosis not present

## 2015-12-24 DIAGNOSIS — Z955 Presence of coronary angioplasty implant and graft: Secondary | ICD-10-CM | POA: Insufficient documentation

## 2015-12-24 DIAGNOSIS — I10 Essential (primary) hypertension: Secondary | ICD-10-CM | POA: Diagnosis not present

## 2015-12-24 DIAGNOSIS — Z7902 Long term (current) use of antithrombotics/antiplatelets: Secondary | ICD-10-CM | POA: Diagnosis not present

## 2015-12-24 DIAGNOSIS — F419 Anxiety disorder, unspecified: Secondary | ICD-10-CM | POA: Diagnosis not present

## 2015-12-24 DIAGNOSIS — Z79899 Other long term (current) drug therapy: Secondary | ICD-10-CM | POA: Diagnosis not present

## 2015-12-24 DIAGNOSIS — I2510 Atherosclerotic heart disease of native coronary artery without angina pectoris: Secondary | ICD-10-CM | POA: Insufficient documentation

## 2015-12-24 NOTE — Progress Notes (Signed)
Reviewed home exercise with pt today.  Pt plans to walk and do hand-held weights for exercise, 2x/week in addition to cardiac rehab.  Reviewed THR, pulse, RPE, sign and symptoms, NTG use, and when to call 911 or MD.  Also discussed weather considerations and indoor options.  Pt voiced understanding.   Jaivion Kingsley [employer],MS,ACSM RCEP

## 2015-12-24 NOTE — Progress Notes (Signed)
QUALITY OF LIFE SCORE REVIEW  Pt completed Quality of Life survey as a participant in Cardiac Rehab. Patient quality of life slightly altered by physical constraints which limits ability to perform as prior to recent cardiac illness.  Pt reports past concern about shortness of breath, fortunately this symptom has resolved after stent placement.  Pt verbalizes continued concern about the amount of worries in her life. Pt is a chronic worrier.  Pt expresses her biggest concerns are financial, family welfare and her ability to care for others.   Pt encouraged to attend heart sisters support group.  Pt also encouraged to return vocational rehab packet.  Pt verbalized understanding.  Offered emotional support and reassurance.  Will continue to monitor and intervene as necessary.

## 2015-12-26 ENCOUNTER — Encounter (HOSPITAL_COMMUNITY)
Admission: RE | Admit: 2015-12-26 | Discharge: 2015-12-26 | Disposition: A | Discharge status: Home / Self Care | Payer: BLUE CROSS/BLUE SHIELD | Source: Ambulatory Visit | Attending: Cardiology | Admitting: Cardiology

## 2015-12-26 DIAGNOSIS — Z955 Presence of coronary angioplasty implant and graft: Secondary | ICD-10-CM | POA: Diagnosis not present

## 2015-12-28 ENCOUNTER — Encounter (HOSPITAL_COMMUNITY)
Admission: RE | Admit: 2015-12-28 | Discharge: 2015-12-28 | Disposition: A | Discharge status: Home / Self Care | Payer: BLUE CROSS/BLUE SHIELD | Source: Ambulatory Visit | Attending: Cardiology | Admitting: Cardiology

## 2015-12-28 DIAGNOSIS — Z955 Presence of coronary angioplasty implant and graft: Secondary | ICD-10-CM | POA: Diagnosis not present

## 2015-12-31 ENCOUNTER — Encounter (HOSPITAL_COMMUNITY)
Admission: RE | Admit: 2015-12-31 | Discharge: 2015-12-31 | Disposition: A | Discharge status: Home / Self Care | Payer: BLUE CROSS/BLUE SHIELD | Source: Ambulatory Visit | Attending: Cardiology | Admitting: Cardiology

## 2015-12-31 DIAGNOSIS — Z955 Presence of coronary angioplasty implant and graft: Secondary | ICD-10-CM | POA: Diagnosis not present

## 2016-01-02 ENCOUNTER — Encounter (HOSPITAL_COMMUNITY)
Admission: RE | Admit: 2016-01-02 | Discharge: 2016-01-02 | Disposition: A | Discharge status: Home / Self Care | Payer: BLUE CROSS/BLUE SHIELD | Source: Ambulatory Visit | Attending: Cardiology | Admitting: Cardiology

## 2016-01-02 DIAGNOSIS — Z955 Presence of coronary angioplasty implant and graft: Secondary | ICD-10-CM

## 2016-01-04 ENCOUNTER — Encounter (HOSPITAL_COMMUNITY)
Admission: RE | Admit: 2016-01-04 | Discharge: 2016-01-04 | Disposition: A | Discharge status: Home / Self Care | Payer: BLUE CROSS/BLUE SHIELD | Source: Ambulatory Visit | Attending: Cardiology | Admitting: Cardiology

## 2016-01-04 DIAGNOSIS — Z955 Presence of coronary angioplasty implant and graft: Secondary | ICD-10-CM

## 2016-01-07 ENCOUNTER — Encounter (HOSPITAL_COMMUNITY)
Admission: RE | Admit: 2016-01-07 | Discharge: 2016-01-07 | Disposition: A | Discharge status: Home / Self Care | Payer: BLUE CROSS/BLUE SHIELD | Source: Ambulatory Visit | Attending: Cardiology | Admitting: Cardiology

## 2016-01-07 ENCOUNTER — Other Ambulatory Visit (INDEPENDENT_AMBULATORY_CARE_PROVIDER_SITE_OTHER): Payer: BLUE CROSS/BLUE SHIELD | Admitting: *Deleted

## 2016-01-07 DIAGNOSIS — I10 Essential (primary) hypertension: Secondary | ICD-10-CM

## 2016-01-07 DIAGNOSIS — I2510 Atherosclerotic heart disease of native coronary artery without angina pectoris: Secondary | ICD-10-CM

## 2016-01-07 DIAGNOSIS — R079 Chest pain, unspecified: Secondary | ICD-10-CM | POA: Diagnosis not present

## 2016-01-07 DIAGNOSIS — Z955 Presence of coronary angioplasty implant and graft: Secondary | ICD-10-CM | POA: Diagnosis not present

## 2016-01-07 LAB — HEPATIC FUNCTION PANEL
ALT: 68 U/L — ABNORMAL HIGH (ref 6–29)
AST: 35 U/L (ref 10–35)
Albumin: 4.1 g/dL (ref 3.6–5.1)
Alkaline Phosphatase: 83 U/L (ref 33–130)
BILIRUBIN DIRECT: 0.1 mg/dL (ref <=0.2)
BILIRUBIN INDIRECT: 0.4 mg/dL (ref 0.2–1.2)
TOTAL PROTEIN: 6.8 g/dL (ref 6.1–8.1)
Total Bilirubin: 0.5 mg/dL (ref 0.2–1.2)

## 2016-01-07 LAB — LIPID PANEL
Cholesterol: 119 mg/dL — ABNORMAL LOW (ref 125–200)
HDL: 55 mg/dL (ref >=46)
LDL CALC: 54 mg/dL (ref <130)
TRIGLYCERIDES: 52 mg/dL (ref <150)
Total CHOL/HDL Ratio: 2.2 Ratio (ref <=5.0)
VLDL: 10 mg/dL (ref <30)

## 2016-01-07 NOTE — Addendum Note (Signed)
Addended by: Tonita PhoenixBOWDEN, Kiree Dejarnette K on: 01/07/2016 09:30 AM   Modules accepted: Orders

## 2016-01-07 NOTE — Addendum Note (Signed)
Addended by: Keylin Ferryman K on: 01/07/2016 09:30 AM   Modules accepted: Orders  

## 2016-01-09 ENCOUNTER — Encounter (HOSPITAL_COMMUNITY)
Admission: RE | Admit: 2016-01-09 | Discharge: 2016-01-09 | Disposition: A | Discharge status: Home / Self Care | Payer: BLUE CROSS/BLUE SHIELD | Source: Ambulatory Visit | Attending: Cardiology | Admitting: Cardiology

## 2016-01-09 DIAGNOSIS — Z955 Presence of coronary angioplasty implant and graft: Secondary | ICD-10-CM | POA: Diagnosis not present

## 2016-01-10 ENCOUNTER — Telehealth: Payer: Self-pay

## 2016-01-10 DIAGNOSIS — R740 Nonspecific elevation of levels of transaminase and lactic acid dehydrogenase [LDH]: Principal | ICD-10-CM

## 2016-01-10 DIAGNOSIS — R7401 Elevation of levels of liver transaminase levels: Secondary | ICD-10-CM

## 2016-01-10 NOTE — Telephone Encounter (Signed)
-----   Message from Quintella Reichertraci R Turner, MD sent at 01/08/2016 10:47 AM EDT ----- Normal lipids but mildly elevated ALT - repeat ALT in 4 weeks

## 2016-01-10 NOTE — Telephone Encounter (Signed)
Informed patient of results and verbal understanding expressed.  Repeat ALT scheduled for 6/16. Patient agrees with treatment plan.

## 2016-01-11 ENCOUNTER — Encounter (HOSPITAL_COMMUNITY)
Admission: RE | Admit: 2016-01-11 | Discharge: 2016-01-11 | Disposition: A | Discharge status: Home / Self Care | Payer: BLUE CROSS/BLUE SHIELD | Source: Ambulatory Visit | Attending: Cardiology | Admitting: Cardiology

## 2016-01-11 DIAGNOSIS — Z955 Presence of coronary angioplasty implant and graft: Secondary | ICD-10-CM

## 2016-01-11 NOTE — Progress Notes (Signed)
Cardiac Individual Treatment Plan  Patient Details  Name: Tonya Bryant MRN: [id number] Date of Birth: [birth year] Referring Provider:        CARDIAC REHAB PHASE II ORIENTATION from 12/13/2015 in [facility] CARDIAC [facility]   Referring Provider  Dr. Gevena Cotton      Initial Encounter Date:       CARDIAC REHAB PHASE II ORIENTATION from 12/13/2015 in [facility] CARDIAC REHAB   Date  12/13/15   Referring Provider  Dr. Gevena Cotton      Visit Diagnosis: Stented coronary artery  Patient's Home Medications on Admission:  Current outpatient prescriptions:  .  amLODipine (NORVASC) 10 MG tablet, Take 1 tablet (10 mg total) by mouth daily., Disp: 90 tablet, Rfl: 0 .  aspirin 81 MG tablet, Take 1 tablet (81 mg total) by mouth daily., Disp: 90 tablet, Rfl: 0 .  atorvastatin (LIPITOR) 80 MG tablet, Take 1 tablet (80 mg total) by mouth daily at 6 PM., Disp: 90 tablet, Rfl: 0 .  citalopram (CELEXA) 40 MG tablet, Take 0.5 tablets (20 mg total) by mouth daily., Disp: , Rfl:  .  isosorbide mononitrate (IMDUR) 30 MG 24 hr tablet, Take 1 tablet (30 mg total) by mouth daily., Disp: 30 tablet, Rfl: 11 .  lisinopril (PRINIVIL,ZESTRIL) 20 MG tablet, Take 1 tablet (20 mg total) by mouth daily., Disp: 30 tablet, Rfl: 0 .  nitroGLYCERIN (NITROSTAT) 0.4 MG SL tablet, Place 1 tablet (0.4 mg total) under the tongue every 5 (five) minutes as needed for chest pain (for 3 doses only)., Disp: 25 tablet, Rfl: 1 .  pantoprazole (PROTONIX) 40 MG tablet, Take 1 tablet (40 mg total) by mouth daily at 12 noon. (Patient taking differently: Take 40 mg by mouth daily at 6 PM. ), Disp: 90 tablet, Rfl: 0 .  ticagrelor (BRILINTA) 90 MG TABS tablet, Take 1 tablet (90 mg total) by mouth 2 (two) times daily. (Patient taking differently: Take 90 mg by mouth 2 (two) times daily. Morning and 6pm), Disp: 180 tablet, Rfl: 0  Past Medical History: Past Medical History  Diagnosis Date  .  Hypertension   . Anxiety   . Depression (emotion)   . Full dentures   . CAD (coronary artery disease), native coronary artery 11/02/2015    Ost RCA lesion, 30% stenosed, Prox LAD lesion, 50% stenosed, 1st Diag lesion, 65% stenosed, Ramus lesion, 50% stenosed, Mid Cx lesion, 80% stenosed, Dist LAD lesion, 75% stenosed. 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 Never Smoker   Smokeless tobacco  . Not on file    Labs:     Recent Review Flowsheet Data    Labs for ITP Cardiac and Pulmonary Rehab Latest Ref Rng 10/31/2015 11/01/2015 11/01/2015 11/22/2015 01/07/2016   Cholestrol 125 - 200 mg/dL - [phone] 782(N)   LDLCALC <130 mg/dL - 562(Z) 308(M) 46 54   HDL >=46 mg/dL - 54 53 42 55   Trlycerides <150 mg/dL - 68 81 58 52   Hemoglobin A1c 4.8 - 5.6 % 5.8(H) - - - -      Capillary Blood Glucose: No results found for: GLUCAP   Exercise Target Goals:    Exercise Program Goal: Individual exercise prescription set with THRR, safety & activity barriers. Participant demonstrates ability to understand and report RPE using BORG scale, to self-measure pulse accurately, and to acknowledge the importance of the exercise prescription.  Exercise  Prescription Goal: Starting with aerobic activity 30 plus minutes a day, 3 days per week for initial exercise prescription. Provide home exercise prescription and guidelines that participant acknowledges understanding prior to discharge.  Activity Barriers & Risk Stratification:     Activity Barriers & Cardiac Risk Stratification - 12/13/15 0841    Activity Barriers & Cardiac Risk Stratification   Activity Barriers Other (comment)   Comments R sided shoulder and neck pain   Cardiac Risk Stratification High      6 Minute Walk:     6 Minute Walk      12/13/15 0935 12/13/15 1114     6 Minute Walk   Phase Initial     Distance 1703 feet     Walk Time 6 minutes     # of Rest Breaks 0     MPH 3.2      METS 3.4     RPE 13     VO2 Peak 11.9     Symptoms No     Resting HR 86 bpm     Resting BP 140/76 mmHg     Max Ex. HR 120 bpm     Max Ex. BP 128/60 mmHg     2 Minute Post BP  102/70 mmHg       Initial Exercise Prescription:     Initial Exercise Prescription - 12/13/15 1100    Date of Initial Exercise RX and Referring Provider   Date 12/13/15   Referring Provider Dr. Gevena Cottonraci Turner,MD   Treadmill   MPH 2.6   Grade 1   Minutes 10   METs 2.8   Bike   Level 0.7   Minutes 10   METs 2.5   NuStep   Level 3   Minutes 10   METs 2   Prescription Details   Frequency (times per week) 3   Duration Progress to 30 minutes of continuous aerobic without signs/symptoms of physical distress   Intensity   THRR 40-80% of Max Heartrate 63-126   Ratings of Perceived Exertion 11-13   Progression   Progression Continue to progress workloads to maintain intensity without signs/symptoms of physical distress.   Resistance Training   Training Prescription Yes   Weight 1   Reps 10-12      Perform Capillary Blood Glucose checks as needed.  Exercise Prescription Changes:      Exercise Prescription Changes      12/20/15 1000 12/27/15 1600 01/10/16 1600       Exercise Review   Progression  Yes Yes     Response to Exercise   Blood Pressure (Admit)  150/78 mmHg 120/70 mmHg     Blood Pressure (Exercise)  168/80 mmHg 158/72 mmHg     Blood Pressure (Exit)  122/70 mmHg 118/68 mmHg     Heart Rate (Admit)  84 bpm 85 bpm     Heart Rate (Exercise)  126 bpm 123 bpm     Heart Rate (Exit)  93 bpm 86 bpm     Rating of Perceived Exertion (Exercise)  12 10     Duration   Progress to 30 minutes of continuous aerobic without signs/symptoms of physical distress     Intensity   THRR unchanged     Progression   Progression   Continue to progress workloads to maintain intensity without signs/symptoms of physical distress.     Average METs  2.8 3.2     Resistance Training   Training Prescription   Yes  Weight   2lb     Reps   10-12     Treadmill   MPH  2.6 2.6     Grade  1 1     Minutes  10 10     METs  2.8 2.8     Recumbant Bike   Level 2 2 2.5     Minutes METs NuStep   Level  3 3     Minutes  10 10     METs  2 3     Home Exercise Plan   Plans to continue exercise at  Home  plans to walk and do hand-held weights      Frequency  Add 2 additional days to program exercise sessions.         Exercise Comments:      Exercise Comments      12/20/15 1024 01/10/16 1657         Exercise Comments pt is responding to exercises well, changed equipment due to rec. bike being more comfortable and a  better fit. There are no exercise progressions demonstrated at this time, will continue to monitor pt is tolerating exercise well; will continue to monitor exercise progression         Discharge Exercise Prescription (Final Exercise Prescription Changes):     Exercise Prescription Changes - 01/10/16 1600    Exercise Review   Progression Yes   Response to Exercise   Blood Pressure (Admit) 120/70 mmHg   Blood Pressure (Exercise) 158/72 mmHg   Blood Pressure (Exit) 118/68 mmHg   Heart Rate (Admit) 85 bpm   Heart Rate (Exercise) 123 bpm   Heart Rate (Exit) 86 bpm   Rating of Perceived Exertion (Exercise) 10   Duration Progress to 30 minutes of continuous aerobic without signs/symptoms of physical distress   Intensity THRR unchanged   Progression   Progression Continue to progress workloads to maintain intensity without signs/symptoms of physical distress.   Average METs 3.2   Resistance Training   Training Prescription Yes   Weight 2lb   Reps 10-12   Treadmill   MPH 2.6   Grade 1   Minutes 10   METs 2.8   Recumbant Bike   Level 2.5   Minutes 10   METs 2   NuStep   Level 3   Minutes 10   METs 3      Nutrition:  Target Goals: Understanding of nutrition guidelines, daily intake of sodium 1500mg , cholesterol 200mg , calories 30% from fat  and 7% or less from saturated fats, daily to have 5 or more servings of fruits and vegetables.  Biometrics:     Pre Biometrics - 12/13/15 1108    Pre Biometrics   Single Leg Stand 5.5 seconds       Nutrition Therapy Plan and Nutrition Goals:     Nutrition Therapy & Goals - 12/13/15 1010    Nutrition Therapy   Diet Therapeutic Lifestyle Changes   Personal Nutrition Goals   Personal Goal #1 Weight loss of 0.5-2 lb/week to a goal wt loss of 6-24 lb at graduation from Cardiac Rehab   Intervention Plan   Intervention Prescribe, educate and counsel regarding individualized specific dietary modifications aiming towards targeted core components such as weight, hypertension, lipid management, diabetes, heart failure and other comorbidities.   Expected Outcomes Short Term Goal: Understand basic principles of dietary content, such as calories, fat, sodium, cholesterol  and nutrients.;Long Term Goal: Adherence to prescribed nutrition plan.      Nutrition Discharge: Nutrition Scores:     Nutrition Assessments - 12/21/15 1025    MEDFICTS Scores   Pre Score 12      Nutrition Goals Re-Evaluation:     Nutrition Goals Re-Evaluation      12/21/15 1025           Personal Goal #1 Re-Evaluation   Personal Goal #1 Weight loss of 0.5-2 lb/week to a goal wt loss of 6-24 lb at graduation from Cardiac Rehab       Goal Progress Seen Yes       Comments Wt today 93.2 kg, which is down 0.5 kg from admission wt of 93.7 kg       Intervention Plan   Intervention Continue to educate, counsel and set short/long term goals regarding individualized specific personal dietary modifications.;Nutrition handout(s) given to patient.       Comments Handouts given for 5-day, 1500 kcal menu ideas and Pre-diabetes          Psychosocial: Target Goals: Acknowledge presence or absence of depression, maximize coping skills, provide positive support system. Participant is able to verbalize types and ability to use  techniques and skills needed for reducing stress and depression.  Initial Review & Psychosocial Screening:     Initial Psych Review & Screening - 12/17/15 1240    Initial Review   Current issues with Current Depression;Current Sleep Concerns   Family Dynamics   Good Support System? Yes   Comments daughter and friends    Barriers   Psychosocial barriers to participate in program The patient should benefit from training in stress management and relaxation.   Screening Interventions   Interventions Encouraged to exercise;Other (comment)   Comments encouraged to discuss depression symptoms and sleep concerns with her PCP      Quality of Life Scores:     Quality of Life - 12/24/15 0908    Quality of Life Scores   GLOBAL Pre --  see progress notes       PHQ-9:     Recent Review Flowsheet Data    Depression screen [facility] 2/9 12/17/2015   Decreased Interest 1   Down, Depressed, Hopeless 1   PHQ - 2 Score 2   Altered sleeping 3   Tired, decreased energy 2   Change in appetite 2   Feeling bad or failure about yourself  1   Trouble concentrating 0   Moving slowly or fidgety/restless 0   Suicidal thoughts 0   PHQ-9 Score 10   Difficult doing work/chores Somewhat difficult      Psychosocial Evaluation and Intervention:     Psychosocial Evaluation - 01/09/16 1045    Psychosocial Evaluation & Interventions   Interventions Stress management education;Relaxation education;Encouraged to exercise with the program and follow exercise prescription   Comments health related anxiety decreasing however pt continues to have concerns about her physical ability to return to work, voc rehab arranged.  pt is not exercising at home on her own and encouraged to do so   Continued Psychosocial Services Needed Yes      Psychosocial Re-Evaluation:     Psychosocial Re-Evaluation      01/09/16 1046           Psychosocial Re-Evaluation   Interventions Stress management  education;Relaxation education;Encouraged to attend Cardiac Rehabilitation for the exercise       Comments pt encouraged to exercise at home on her off days from  rehab       Continued Psychosocial Services Needed Yes          Vocational Rehabilitation: Provide vocational rehab assistance to qualifying candidates.   Vocational Rehab Evaluation & Intervention:     Vocational Rehab - 01/09/16 1025    Initial Vocational Rehab Evaluation & Intervention   Vocational Rehab Packet given to patient 12/19/15  packet faxed to [facility] 12/24/15, phone call to [facility], refaxed 01/01/16, pc to [facility] 01/07/16, 01/09/16 Voc Reh representative states packet has been recieved, letter has been mailed to pt with orientation information       Education: Education Goals: Education classes will be provided on a weekly basis, covering required topics. Participant will state understanding/return demonstration of topics presented.  Learning Barriers/Preferences:     Learning Barriers/Preferences - 12/13/15 1610    Learning Barriers/Preferences   Learning Barriers Sight   Learning Preferences Skilled Demonstration;Video;Written Material      Education Topics: Count Your Pulse:  -Group instruction provided by verbal instruction, demonstration, patient participation and written materials to support subject.  Instructors address importance of being able to find your pulse and how to count your pulse when at home without a heart monitor.  Patients get hands on experience counting their pulse with staff help and individually.      CARDIAC REHAB PHASE II EXERCISE from 01/11/2016 in [facility] CARDIAC REHAB   Date  12/28/15   Educator  Karlene Lineman, RN   Instruction Review Code  2- meets goals/outcomes      Heart Attack, Angina, and Risk Factor Modification:  -Group instruction provided by verbal instruction, video, and written materials to support subject.  Instructors address signs and symptoms of angina  and heart attacks.    Also discuss risk factors for heart disease and how to make changes to improve heart health risk factors.      CARDIAC REHAB PHASE II EXERCISE from 01/11/2016 in [facility] CARDIAC REHAB   Date  12/26/15   Instruction Review Code  2- meets goals/outcomes      Functional Fitness:  -Group instruction provided by verbal instruction, demonstration, patient participation, and written materials to support subject.  Instructors address safety measures for doing things around the house.  Discuss how to get up and down off the floor, how to pick things up properly, how to safely get out of a chair without assistance, and balance training.      CARDIAC REHAB PHASE II EXERCISE from 01/11/2016 in [facility] CARDIAC REHAB   Date  01/11/16   Instruction Review Code  2- meets goals/outcomes      Meditation and Mindfulness:  -Group instruction provided by verbal instruction, patient participation, and written materials to support subject.  Instructor addresses importance of mindfulness and meditation practice to help reduce stress and improve awareness.  Instructor also leads participants through a meditation exercise.    Stretching for Flexibility and Mobility:  -Group instruction provided by verbal instruction, patient participation, and written materials to support subject.  Instructors lead participants through series of stretches that are designed to increase flexibility thus improving mobility.  These stretches are additional exercise for major muscle groups that are typically performed during regular warm up and cool down.   Hands Only CPR Anytime:  -Group instruction provided by verbal instruction, video, patient participation and written materials to support subject.  Instructors co-teach with [facility] video for hands only CPR.  Participants get hands on experience with mannequins.  Nutrition I class: Heart Healthy Eating:  -Group instruction  provided by PowerPoint slides, verbal discussion, and written materials to support subject matter. The instructor gives an explanation and review of the Therapeutic Lifestyle Changes diet recommendations, which includes a discussion on lipid goals, dietary fat, sodium, fiber, plant stanol/sterol esters, sugar, and the components of a well-balanced, healthy diet.      CARDIAC REHAB PHASE II EXERCISE from 01/11/2016 in [facility] CARDIAC REHAB   Date  12/25/15   Educator  RD   Instruction Review Code  2- meets goals/outcomes      Nutrition II class: Lifestyle Skills:  -Group instruction provided by PowerPoint slides, verbal discussion, and written materials to support subject matter. The instructor gives an explanation and review of label reading, grocery shopping for heart health, heart healthy recipe modifications, and ways to make healthier choices when eating out.   Diabetes Question & Answer:  -Group instruction provided by PowerPoint slides, verbal discussion, and written materials to support subject matter. The instructor gives an explanation and review of diabetes co-morbidities, pre- and post-prandial blood glucose goals, pre-exercise blood glucose goals, signs, symptoms, and treatment of hypoglycemia and hyperglycemia, and foot care basics.      CARDIAC REHAB PHASE II EXERCISE from 01/11/2016 in [facility] CARDIAC REHAB   Date  12/21/15   Educator  RD   Instruction Review Code  2- meets goals/outcomes      Diabetes Blitz:  -Group instruction provided by PowerPoint slides, verbal discussion, and written materials to support subject matter. The instructor gives an explanation and review of the physiology behind type 1 and type 2 diabetes, diabetes medications and rational behind using different medications, pre- and post-prandial blood glucose recommendations and Hemoglobin A1c goals, diabetes diet, and exercise including blood glucose guidelines for  exercising safely.    Portion Distortion:  -Group instruction provided by PowerPoint slides, verbal discussion, written materials, and food models to support subject matter. The instructor gives an explanation of serving size versus portion size, changes in portions sizes over the last 20 years, and what consists of a serving from each food group.      CARDIAC REHAB PHASE II EXERCISE from 01/11/2016 in [facility] CARDIAC REHAB   Date  12/19/15   Educator  RD   Instruction Review Code  2- meets goals/outcomes      Stress Management:  -Group instruction provided by verbal instruction, video, and written materials to support subject matter.  Instructors review role of stress in heart disease and how to cope with stress positively.     Exercising on Your Own:  -Group instruction provided by verbal instruction, power point, and written materials to support subject.  Instructors discuss benefits of exercise, components of exercise, frequency and intensity of exercise, and end points for exercise.  Also discuss use of nitroglycerin and activating EMS.  Review options of places to exercise outside of rehab.  Review guidelines for sex with heart disease.   Cardiac Drugs I:  -Group instruction provided by verbal instruction and written materials to support subject.  Instructor reviews cardiac drug classes: antiplatelets, anticoagulants, beta blockers, and statins.  Instructor discusses reasons, side effects, and lifestyle considerations for each drug class.   Cardiac Drugs II:  -Group instruction provided by verbal instruction and written materials to support subject.  Instructor reviews cardiac drug classes: angiotensin converting enzyme inhibitors (ACE-I), angiotensin II receptor blockers (ARBs), nitrates, and calcium channel blockers.  Instructor discusses reasons, side effects,  and lifestyle considerations for each drug class.      CARDIAC REHAB PHASE II EXERCISE from 01/11/2016  in [facility] CARDIAC REHAB   Date  01/02/16   Educator  Pharm D   Instruction Review Code  2- meets goals/outcomes      Anatomy and Physiology of the Circulatory System:  -Group instruction provided by verbal instruction, video, and written materials to support subject.  Reviews functional anatomy of heart, how it relates to various diagnoses, and what role the heart plays in the overall system.          CARDIAC REHAB PHASE II EXERCISE from 01/11/2016 in [facility] CARDIAC REHAB   Date  01/09/16   Instruction Review Code  2- meets goals/outcomes      Knowledge Questionnaire Score:     Knowledge Questionnaire Score - 12/13/15 1118    Knowledge Questionnaire Score   Pre Score 20/24      Core Components/Risk Factors/Patient Goals at Admission:     Personal Goals and Risk Factors at Admission - 12/13/15 0845    Core Components/Risk Factors/Patient Goals on Admission    Weight Management Obesity;Weight Loss;Yes   Intervention Weight Management: Develop a combined nutrition and exercise program designed to reach desired caloric intake, while maintaining appropriate intake of nutrient and fiber, sodium and fats, and appropriate energy expenditure required for the weight goal.;Weight Management: Provide education and appropriate resources to help participant work on and attain dietary goals.;Weight Management/Obesity: Establish reasonable short term and long term weight goals.;Obesity: Provide education and appropriate resources to help participant work on and attain dietary goals.   Expected Outcomes Short Term: Continue to assess and modify interventions until short term weight is achieved;Long Term: Adherence to nutrition and physical activity/exercise program aimed toward attainment of established weight goal;Understanding of distribution of calorie intake throughout the day with the consumption of 4-5 meals/snacks;Weight Loss: Understanding of general  recommendations for a balanced deficit meal plan, which promotes 1-2 lb weight loss per week and includes a negative energy balance of [id number] kcal/d;Understanding recommendations for meals to include 15-35% energy as protein, 25-35% energy from fat, 35-60% energy from carbohydrates, less than 200mg  of dietary cholesterol, 20-35 gm of total fiber daily   Increase Strength and Stamina Yes   Improve shortness of breath with ADL's Yes   Intervention Provide education, individualized exercise plan and daily activity instruction to help decrease symptoms of SOB with activities of daily living.   Expected Outcomes Short Term: Achieves a reduction of symptoms when performing activities of daily living.   Personal Goal Other Yes   Personal Goal Lose 8-10lbs, help with nutrition and diet, increase strength and stamina   Intervention increase exercise capacity, decr. caloric intake    Expected Outcomes Better nutrtion and improved exercise tolerance and endurance      Core Components/Risk Factors/Patient Goals Review:      Goals and Risk Factor Review      12/13/15 0845 12/21/15 1027 01/10/16 1657       Core Components/Risk Factors/Patient Goals Review   Personal Goals Review Weight Management/Obesity;Increase Strength and Stamina;Other Weight Management/Obesity Weight Management/Obesity;Other     Review  Pt wt 93.2 kg today, which is down 0.5 kg. See RD note for details.  maintaining weight, difficult to lose around the holidays, encouraged getting back on track. Pt is doing well with nutrition otherwise     Expected Outcomes  0.5-2 lb/week with a goal wt loss of 6-24 lb at graduation  from Cardiac Rehab continue to lose weight and work on dietary changes        Core Components/Risk Factors/Patient Goals at Discharge (Final Review):      Goals and Risk Factor Review - 01/10/16 1657    Core Components/Risk Factors/Patient Goals Review   Personal Goals Review Weight Management/Obesity;Other    Review maintaining weight, difficult to lose around the holidays, encouraged getting back on track. Pt is doing well with nutrition otherwise   Expected Outcomes continue to lose weight and work on dietary changes      ITP Comments:     ITP Comments      12/11/15 1000 12/13/15 0934         ITP Comments Dr. Armanda Magic, Medical Director Dr. Armanda Magic, Medical Director         Comments: Pt is making expected progress toward personal goals after completing 14 sessions. Recommend continued exercise and life style modification education including  stress management and relaxation techniques to decrease cardiac risk profile.

## 2016-01-14 ENCOUNTER — Encounter (HOSPITAL_COMMUNITY)
Admission: RE | Admit: 2016-01-14 | Discharge: 2016-01-14 | Disposition: A | Discharge status: Home / Self Care | Payer: BLUE CROSS/BLUE SHIELD | Source: Ambulatory Visit | Attending: Cardiology | Admitting: Cardiology

## 2016-01-14 DIAGNOSIS — Z955 Presence of coronary angioplasty implant and graft: Secondary | ICD-10-CM | POA: Diagnosis not present

## 2016-01-16 ENCOUNTER — Encounter (HOSPITAL_COMMUNITY)
Admission: RE | Admit: 2016-01-16 | Discharge: 2016-01-16 | Disposition: A | Discharge status: Home / Self Care | Payer: BLUE CROSS/BLUE SHIELD | Source: Ambulatory Visit | Attending: Cardiology | Admitting: Cardiology

## 2016-01-16 DIAGNOSIS — Z955 Presence of coronary angioplasty implant and graft: Secondary | ICD-10-CM | POA: Diagnosis not present

## 2016-01-18 ENCOUNTER — Encounter (HOSPITAL_COMMUNITY)
Admission: RE | Admit: 2016-01-18 | Discharge: 2016-01-18 | Disposition: A | Discharge status: Home / Self Care | Payer: BLUE CROSS/BLUE SHIELD | Source: Ambulatory Visit | Attending: Cardiology | Admitting: Cardiology

## 2016-01-18 DIAGNOSIS — Z955 Presence of coronary angioplasty implant and graft: Secondary | ICD-10-CM | POA: Diagnosis not present

## 2016-01-23 ENCOUNTER — Encounter (HOSPITAL_COMMUNITY)
Admission: RE | Admit: 2016-01-23 | Discharge: 2016-01-23 | Disposition: A | Discharge status: Home / Self Care | Payer: BLUE CROSS/BLUE SHIELD | Source: Ambulatory Visit | Attending: Cardiology | Admitting: Cardiology

## 2016-01-23 DIAGNOSIS — Z955 Presence of coronary angioplasty implant and graft: Secondary | ICD-10-CM

## 2016-01-25 ENCOUNTER — Encounter (HOSPITAL_COMMUNITY)
Admission: RE | Admit: 2016-01-25 | Discharge: 2016-01-25 | Disposition: A | Discharge status: Home / Self Care | Payer: BLUE CROSS/BLUE SHIELD | Source: Ambulatory Visit | Attending: Cardiology | Admitting: Cardiology

## 2016-01-25 DIAGNOSIS — Z7982 Long term (current) use of aspirin: Secondary | ICD-10-CM | POA: Insufficient documentation

## 2016-01-25 DIAGNOSIS — F419 Anxiety disorder, unspecified: Secondary | ICD-10-CM | POA: Insufficient documentation

## 2016-01-25 DIAGNOSIS — Z79899 Other long term (current) drug therapy: Secondary | ICD-10-CM | POA: Insufficient documentation

## 2016-01-25 DIAGNOSIS — F329 Major depressive disorder, single episode, unspecified: Secondary | ICD-10-CM | POA: Insufficient documentation

## 2016-01-25 DIAGNOSIS — Z955 Presence of coronary angioplasty implant and graft: Secondary | ICD-10-CM | POA: Diagnosis present

## 2016-01-25 DIAGNOSIS — Z7902 Long term (current) use of antithrombotics/antiplatelets: Secondary | ICD-10-CM | POA: Insufficient documentation

## 2016-01-25 DIAGNOSIS — I10 Essential (primary) hypertension: Secondary | ICD-10-CM | POA: Insufficient documentation

## 2016-01-25 DIAGNOSIS — I2510 Atherosclerotic heart disease of native coronary artery without angina pectoris: Secondary | ICD-10-CM | POA: Diagnosis not present

## 2016-01-28 ENCOUNTER — Encounter (HOSPITAL_COMMUNITY)
Admission: RE | Admit: 2016-01-28 | Discharge: 2016-01-28 | Disposition: A | Discharge status: Home / Self Care | Payer: BLUE CROSS/BLUE SHIELD | Source: Ambulatory Visit | Attending: Cardiology | Admitting: Cardiology

## 2016-01-28 NOTE — Progress Notes (Signed)
Pt arrived at cardiac rehab c/o rash on neck, upper chest, arms and thighs.  Bilateral associated with itching. Pt reports symptoms began Saturday, worsened yesterday and minimally improving today. Pt reports history of stress induced hives. Pt denies exposure to new medications, foods, skin care or cleaning products.  Pt advised to contact cardiologist if symptoms persist or worsen. Pt also advised may take benadryl OTC as directed for symptom relief.  Pt did not exercise. Pt also has vocational rehab appointment today at 8:45am. Pt advised to attend that appointment as scheduled. Pt is anxious about her inability to perform expected job duties as prior to her cardiac event. Pt also given information about upcoming community job fair.  Pt offered emotional  Support and reassurance. Pt verbalized understanding.

## 2016-01-29 ENCOUNTER — Telehealth (HOSPITAL_COMMUNITY): Payer: Self-pay | Admitting: Cardiac Rehabilitation

## 2016-01-29 ENCOUNTER — Telehealth: Payer: Self-pay | Admitting: Cardiology

## 2016-01-29 NOTE — Telephone Encounter (Signed)
New Message:  Tonya Bryant called in wanting to report that the pt has a rash around her neck and arms and she doesn't feel that it is medication related. Please f/u with her.

## 2016-01-29 NOTE — Telephone Encounter (Signed)
Spoke with Tonya Bryant who reports pt states rash started Saturday.  She doesn't feel like it's medication related.  Pt denies starting anything new or using different products other than sunscreen on her face.  She was advised to try Benadryl OTC for it but did not.  Advised best to f/u with her PCP for further evaluation.

## 2016-01-30 ENCOUNTER — Encounter (HOSPITAL_COMMUNITY)
Admission: RE | Admit: 2016-01-30 | Discharge: 2016-01-30 | Disposition: A | Discharge status: Home / Self Care | Payer: BLUE CROSS/BLUE SHIELD | Source: Ambulatory Visit | Attending: Cardiology | Admitting: Cardiology

## 2016-01-30 DIAGNOSIS — Z955 Presence of coronary angioplasty implant and graft: Secondary | ICD-10-CM | POA: Diagnosis not present

## 2016-02-01 ENCOUNTER — Encounter (HOSPITAL_COMMUNITY)
Admission: RE | Admit: 2016-02-01 | Discharge: 2016-02-01 | Disposition: A | Discharge status: Home / Self Care | Payer: BLUE CROSS/BLUE SHIELD | Source: Ambulatory Visit | Attending: Cardiology | Admitting: Cardiology

## 2016-02-01 DIAGNOSIS — Z955 Presence of coronary angioplasty implant and graft: Secondary | ICD-10-CM | POA: Diagnosis not present

## 2016-02-04 ENCOUNTER — Encounter (HOSPITAL_COMMUNITY)
Admission: RE | Admit: 2016-02-04 | Discharge: 2016-02-04 | Disposition: A | Discharge status: Home / Self Care | Payer: BLUE CROSS/BLUE SHIELD | Source: Ambulatory Visit | Attending: Cardiology | Admitting: Cardiology

## 2016-02-04 DIAGNOSIS — Z955 Presence of coronary angioplasty implant and graft: Secondary | ICD-10-CM

## 2016-02-06 ENCOUNTER — Encounter (HOSPITAL_COMMUNITY)
Admission: RE | Admit: 2016-02-06 | Discharge: 2016-02-06 | Disposition: A | Discharge status: Home / Self Care | Payer: BLUE CROSS/BLUE SHIELD | Source: Ambulatory Visit | Attending: Cardiology | Admitting: Cardiology

## 2016-02-06 DIAGNOSIS — Z955 Presence of coronary angioplasty implant and graft: Secondary | ICD-10-CM | POA: Diagnosis not present

## 2016-02-07 ENCOUNTER — Ambulatory Visit (INDEPENDENT_AMBULATORY_CARE_PROVIDER_SITE_OTHER): Payer: BLUE CROSS/BLUE SHIELD | Admitting: Cardiology

## 2016-02-07 ENCOUNTER — Telehealth: Payer: Self-pay | Admitting: Cardiology

## 2016-02-07 ENCOUNTER — Encounter: Payer: Self-pay | Admitting: Cardiology

## 2016-02-07 ENCOUNTER — Other Ambulatory Visit (INDEPENDENT_AMBULATORY_CARE_PROVIDER_SITE_OTHER): Payer: BLUE CROSS/BLUE SHIELD | Admitting: *Deleted

## 2016-02-07 ENCOUNTER — Telehealth (HOSPITAL_COMMUNITY): Payer: Self-pay | Admitting: *Deleted

## 2016-02-07 VITALS — BP 126/84 | HR 69 | Ht 60.0 in | Wt 205.4 lb

## 2016-02-07 DIAGNOSIS — I2510 Atherosclerotic heart disease of native coronary artery without angina pectoris: Secondary | ICD-10-CM

## 2016-02-07 DIAGNOSIS — E785 Hyperlipidemia, unspecified: Secondary | ICD-10-CM

## 2016-02-07 DIAGNOSIS — I10 Essential (primary) hypertension: Secondary | ICD-10-CM

## 2016-02-07 DIAGNOSIS — R740 Nonspecific elevation of levels of transaminase and lactic acid dehydrogenase [LDH]: Secondary | ICD-10-CM | POA: Diagnosis not present

## 2016-02-07 DIAGNOSIS — R7401 Elevation of levels of liver transaminase levels: Secondary | ICD-10-CM

## 2016-02-07 HISTORY — DX: Hyperlipidemia, unspecified: E78.5

## 2016-02-07 LAB — ALT: ALT: 123 U/L — AB (ref 6–29)

## 2016-02-07 MED ORDER — TICAGRELOR 90 MG PO TABS
90.0000 mg | ORAL_TABLET | Freq: Two times a day (BID) | ORAL | Status: DC
Start: 2016-02-07 — End: 2016-10-08

## 2016-02-07 NOTE — Telephone Encounter (Signed)
Pt stopped by cardiac rehab on non exercise day.  Pt seen in clinic by Dr. Mayford Knifeurner this morning.  Pt states she requested a letter to return to work and was given a letter to return to work on March 30.  Pt also had second letter excusing her from work for today's medical appointment.  Pt requested cardiac rehab write a note indicating she could return back to work.  Advised pt that I could not write a note for return to work this would need to come from Dr. Mayford Knifeurner.  Called and spoke to donna who advised pt to return back to the office to speak with Florentina AddisonKatie, dr. Mayford Knifeurner nurse due to upcoming travel for Dr. Mayford Knifeurner. Pt asked could a letter be written for her about cardiac rehab.  Agreed to write note regarding he participation in rehab.  Pt to pick note up on tomorrow when she returns to exercise.Alanson Alyarlette Rayshon Albaugh RN, BSN

## 2016-02-07 NOTE — Telephone Encounter (Signed)
Patient walked in the office requesting a return to work with no restrictions note.  Printed off note from RaubRhonda Barrett, [place] 3/30 stating she could return to work with no restrictions and gave it to patient for her records.  She st her work told her she should take some more time given her procedures. Reiterated to patient she was already cleared by Cardiology, and it was her job's decision to hold her out further. She will have her manager call if they have any issues.

## 2016-02-07 NOTE — Patient Instructions (Signed)

## 2016-02-07 NOTE — Telephone Encounter (Addendum)
Late entry from today at 1115: Spoke with Carlette from Cardiac Rehab.  She assured that the patient was never held out of work, but that the patient stated she felt too weak to return to work 3/30. The patient did not start Cardiac Rehab until 4/20.

## 2016-02-07 NOTE — Telephone Encounter (Signed)
Informed patient that Cardiac Rehab never took her out of work. She reiterated that when she did go back to work, her job said she was too weak to return. Reminded patient that she was cleared to work from a Cardiology standpoint 3/30. Informed her that Cardiology will not write a note to return to work now when she has already been cleared. Instructed her to call Cardiac Rehabilitation if further clarification is needed.

## 2016-02-07 NOTE — Telephone Encounter (Signed)
New Message:   Please call,the note you gave her is not very clear.Thank you.

## 2016-02-07 NOTE — Progress Notes (Signed)
Cardiology Office Note    Date:  02/07/2016   ID:  Tonya Bryant, DOB Dec 17, 1950, MRN [id number]  PCP:  Thora LanceEHINGER,ROBERT R, MD  Cardiologist:  Armanda Magicraci Turner, MD   Chief Complaint  Patient presents with  . Coronary Artery Disease  . Hypertension  . Hyperlipidemia    History of Present Illness:  Tonya Bryant is a 65 y.o. female  with history of CAD and implantation of drug-eluting stents in the mid circumflex and distal LAD on 11/01/15. Residual 65% first diagonal stenosis was also noted. She was discharged on ASA, Brilinta, lipitor 80.  She is doing well today.  She has been participating in Cardiac Rehab without any problems.  She denies any anginal chest pain, SOB, DOE, LE edema, dizziness, palpitations or syncope.      Past Medical History  Diagnosis Date  . Hypertension   . Anxiety   . Depression (emotion)   . Full dentures   . CAD (coronary artery disease), native coronary artery 11/02/2015    Ost RCA lesion, 30% stenosed, Prox LAD lesion, 50% stenosed, 1st Diag lesion, 65% stenosed, Ramus lesion, 50% stenosed, Mid Cx lesion, 80% stenosed, Dist LAD lesion, 75% stenosed. S/P DES to distal LAD and mid Cx 10/2015  . Dyslipidemia, goal LDL below 70 02/07/2016    Past Surgical History  Procedure Laterality Date  . Breast surgery      lt br bx-neg  . Toe osteotomy      rt big toe  . Tubal ligation    . Colonoscopy    . Tooth extraction    . Carpal tunnel release Left 07/07/2013    Procedure: LEFT CARPAL TUNNEL RELEASE;  Surgeon: Wyn Forsterobert V Sypher Jr., MD;  Location: [facility];  Service: Orthopedics;  Laterality: Left;  . Trigger finger release Left 07/07/2013    Procedure: LEFT THUMB RELEASE A-1 PULLEY;  Surgeon: Wyn Forsterobert V Sypher Jr., MD;  Location: [facility];  Service: Orthopedics;  Laterality: Left;  . Cardiac catheterization N/A 11/01/2015    Procedure: Left Heart Cath and Coronary Angiography;  Surgeon: Tonny BollmanMichael Cooper, MD;  [facility];  Service: Cardiovascular;  Laterality: N/A;    Current Medications: Outpatient Prescriptions Prior to Visit  Medication Sig Dispense Refill  . amLODipine (NORVASC) 10 MG tablet Take 1 tablet (10 mg total) by mouth daily. 90 tablet 0  . aspirin 81 MG tablet Take 1 tablet (81 mg total) by mouth daily. 90 tablet 0  . atorvastatin (LIPITOR) 80 MG tablet Take 1 tablet (80 mg total) by mouth daily at 6 PM. 90 tablet 0  . citalopram (CELEXA) 40 MG tablet Take 0.5 tablets (20 mg total) by mouth daily.    . isosorbide mononitrate (IMDUR) 30 MG 24 hr tablet Take 1 tablet (30 mg total) by mouth daily. 30 tablet 11  . lisinopril (PRINIVIL,ZESTRIL) 20 MG tablet Take 1 tablet (20 mg total) by mouth daily. 30 tablet 0  . nitroGLYCERIN (NITROSTAT) 0.4 MG SL tablet Place 1 tablet (0.4 mg total) under the tongue every 5 (five) minutes as needed for chest pain (for 3 doses only). 25 tablet 1  . pantoprazole (PROTONIX) 40 MG tablet Take 1 tablet (40 mg total) by mouth daily at 12 noon. (Patient taking differently: Take 40 mg by mouth daily at 6 PM. ) 90 tablet 0  . ticagrelor (BRILINTA) 90 MG TABS tablet Take 1 tablet (90 mg total) by mouth 2 (two) times daily. (Patient taking  differently: Take 90 mg by mouth 2 (two) times daily. Morning and 6pm) 180 tablet 0   No facility-administered medications prior to visit.     Allergies:   Review of patient's allergies indicates no known allergies.   Social History   Social History  . Marital Status: Single    Spouse Name: N/A  . Number of Children: N/A  . Years of Education: N/A   Social History Main Topics  . Smoking status: Never Smoker   . Smokeless tobacco: None  . Alcohol Use: 0.5 oz/week    1 drink(s) per week  . Drug Use: No  . Sexual Activity: Not Asked   Other Topics Concern  . None   Social History Narrative     Family History:  The patient's family history includes Cancer in her mother; Diabetes in her father and mother;  Gout in her father; Hypertension in her father and mother; Stroke in her father and mother.   ROS:   Please see the history of present illness.    Review of Systems  Musculoskeletal: Positive for back pain.  Psychiatric/Behavioral: Positive for depression. The patient is nervous/anxious.    All other systems reviewed and are negative.   PHYSICAL EXAM:   VS:  BP 126/84 mmHg  Pulse 69  Ht 5' (1.524 m)  Wt 205 lb 6.4 oz (93.169 kg)  BMI 40.11 kg/m2   GEN: Well nourished, well developed, in no acute distress HEENT: normal Neck: no JVD, carotid bruits, or masses Cardiac: RRR; no murmurs, rubs, or gallops,no edema.  Intact distal pulses bilaterally.  Respiratory:  clear to auscultation bilaterally, normal work of breathing GI: soft, nontender, nondistended, + BS MS: no deformity or atrophy Skin: warm and dry, no rash Neuro:  Alert and Oriented x 3, Strength and sensation are intact Psych: euthymic mood, full affect  Wt Readings from Last 3 Encounters:  02/07/16 205 lb 6.4 oz (93.169 kg)  12/13/15 206 lb 9.1 oz (93.7 kg)  11/23/15 204 lb 9 oz (92.789 kg)      Studies/Labs Reviewed:   EKG:  EKG is not ordered today.    Recent Labs: 11/21/2015: B Natriuretic Peptide 34.4; Magnesium 2.0; TSH 1.803 11/23/2015: BUN 14; Creatinine, Ser 0.90; Hemoglobin 10.8*; Platelets 307; Potassium 3.9; Sodium 139 01/07/2016: ALT 68*   Lipid Panel    Component Value Date/Time   CHOL 119* 01/07/2016 0930   TRIG 52 01/07/2016 0930   HDL 55 01/07/2016 0930   CHOLHDL 2.2 01/07/2016 0930   VLDL 10 01/07/2016 0930   LDLCALC 54 01/07/2016 0930    Additional studies/ records that were reviewed today include:  none    ASSESSMENT:    1. Coronary artery disease involving native coronary artery of native heart without angina pectoris   2. Essential hypertension   3. Dyslipidemia, goal LDL below 70      PLAN:  In order of problems listed above:  1.  ASCAD s/p PCI of the mid LCx and distal   LAD on DAPT.  Continue ASA/Brilinta/statin and long acting nitrate.   2.  HTN - well controlled on current meds. Continue amlodipine/ACE I. 3.  Hyperlipidemia with LDL goal < 70.  Continue statin.   LDL at goal at 54.   Medication Adjustments/Labs and Tests Ordered: Current medicines are reviewed at length with the patient today.  Concerns regarding medicines are outlined above.  Medication changes, Labs and Tests ordered today are listed in the Patient Instructions below.  There are no Patient  Instructions on file for this visit.   Signed, Armanda Magic, MD  02/07/2016 8:53 AM    [facility] [address] [place], [place], [place]  [address] Phone: [phone]; Fax: [phone]

## 2016-02-08 ENCOUNTER — Encounter: Payer: Self-pay | Admitting: *Deleted

## 2016-02-08 ENCOUNTER — Encounter (HOSPITAL_COMMUNITY)
Admission: RE | Admit: 2016-02-08 | Discharge: 2016-02-08 | Disposition: A | Discharge status: Home / Self Care | Payer: BLUE CROSS/BLUE SHIELD | Source: Ambulatory Visit | Attending: Cardiology | Admitting: Cardiology

## 2016-02-08 ENCOUNTER — Other Ambulatory Visit: Payer: BLUE CROSS/BLUE SHIELD

## 2016-02-08 ENCOUNTER — Telehealth: Payer: Self-pay | Admitting: *Deleted

## 2016-02-08 DIAGNOSIS — Z955 Presence of coronary angioplasty implant and graft: Secondary | ICD-10-CM

## 2016-02-08 NOTE — Progress Notes (Deleted)
Daily Session Note  Patient Details  Name: Tonya Bryant MRN: [id number] Date of Birth: 02/16/1951 Referring Provider:        CARDIAC REHAB PHASE II ORIENTATION from 12/13/2015 in [facility]   Referring Provider  Dr. Traci Turner,MD      Encounter Date: 02/08/2016  Check In:     Session Check In - 02/08/16 0822    Check-In   Location [facility]   Staff Present Carlette Carlton, RN, BSN;Joann Rion, RN, BSN;Molly diVincenzo, MS, ACSM RCEP, Exercise Physiologist   Supervising physician immediately available to respond to emergencies [facility] immediately available   Physician(s) Dr. Smith   Medication changes reported     No   Fall or balance concerns reported    No   Warm-up and Cool-down Performed as group-led instruction   Resistance Training Performed Yes   VAD Patient? No   Pain Assessment   Currently in Pain? No/denies      Capillary Blood Glucose: No results found for this or any previous visit (from the past 24 hour(s)).   Goals Met:  Exercise tolerated well  Goals Unmet:  Not Applicable  Comments: pt pre-exercise CBG-96. Pt had eaten bacon, eggs, toast and juice. Pt reduced AM humalog to 25units.  Pt given banana and participated in 2 exercise stations, handweights and cool down stretches.  Pt post exercise CBG-91.  Pt asymptomatic.  Will review with Dr. Andrew Wright for insulin dosage recommendation.    Dr. Traci Turner is Medical Director for Cardiac Rehab at [facility]. 

## 2016-02-08 NOTE — Telephone Encounter (Signed)
Spoke with pt, aware will generate letter and place at the front desk for pick up.

## 2016-02-08 NOTE — Telephone Encounter (Signed)
-----  Message from Sueanne Margarita, MD sent at 02/08/2016 11:58 AM EDT ----- Regarding: RE: cardiac rehab  Please have one of the PAs give her a note to return to work on Monday 6/19 as I am out of town  Sports coach ----- Message -----    From: Lowell Guitar, RN    Sent: 02/08/2016   7:33 AM      To: Sueanne Margarita, MD Subject: cardiac rehab                                  Dear Dr. Sharlyne Cai has been out of work since her 11/01/15 DES admission. Since this event pt has had multiple readmissions for chest pain, negative cardiac workups. Pt has first diagonal lesion with 65% stenosis, which continues to be treated medically.   Almyra Deforest, [place] gave pt clearance to return to work 11/19/15 and she had episode of pain which resulted in one of the admissions.  Rosaria Ferries, PA gave pt clearance to return to work on 11/22/15 which lead to ED visit.  Pt has not returned to work since this time for fear of recurrent event.  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  Her employer required her to return to work with no restrictions. As a result of knowledge of residual disease and fear of CAD progression, pt had emotional trauma related to her event with the frequent ED visits for chest pain associated with work requirements. Pt met with vocational rehab in event that she did not regain strength, stamina and emotional ability to return to her present job.     However, Pt has been progressing in cardiac rehab and now feels that she is strong enough to return to her present job.   Her dyspnea on exertion has resolved.   Her average mets are 3.9, which indicates that she now has physical ability to perform her necessary job tasks. Vital signs and telemetry are WNL with exercise.  Cardiac rehab activities have given her the confidence that she is able to perform at this workload.  Could you please write her a note indicating she can return to work on 02/11/16?    Thank you, Andi Hence, RN, BSN Cardiac Pulmonary Rehab

## 2016-02-11 ENCOUNTER — Ambulatory Visit (HOSPITAL_COMMUNITY): Payer: Self-pay | Admitting: Cardiac Rehabilitation

## 2016-02-11 ENCOUNTER — Encounter (HOSPITAL_COMMUNITY)
Admission: RE | Admit: 2016-02-11 | Discharge: 2016-02-11 | Disposition: A | Discharge status: Home / Self Care | Payer: BLUE CROSS/BLUE SHIELD | Source: Ambulatory Visit | Attending: Cardiology | Admitting: Cardiology

## 2016-02-11 DIAGNOSIS — Z955 Presence of coronary angioplasty implant and graft: Secondary | ICD-10-CM

## 2016-02-13 ENCOUNTER — Encounter (HOSPITAL_COMMUNITY): Admission: RE | Admit: 2016-02-13 | Payer: BLUE CROSS/BLUE SHIELD | Source: Ambulatory Visit

## 2016-02-15 ENCOUNTER — Encounter (HOSPITAL_COMMUNITY): Payer: BLUE CROSS/BLUE SHIELD

## 2016-02-18 ENCOUNTER — Encounter (HOSPITAL_COMMUNITY): Payer: BLUE CROSS/BLUE SHIELD

## 2016-02-19 ENCOUNTER — Telehealth: Payer: Self-pay

## 2016-02-19 ENCOUNTER — Other Ambulatory Visit (INDEPENDENT_AMBULATORY_CARE_PROVIDER_SITE_OTHER): Payer: BLUE CROSS/BLUE SHIELD

## 2016-02-19 DIAGNOSIS — E785 Hyperlipidemia, unspecified: Secondary | ICD-10-CM

## 2016-02-19 DIAGNOSIS — R740 Nonspecific elevation of levels of transaminase and lactic acid dehydrogenase [LDH]: Secondary | ICD-10-CM

## 2016-02-19 DIAGNOSIS — R7401 Elevation of levels of liver transaminase levels: Secondary | ICD-10-CM

## 2016-02-19 NOTE — Telephone Encounter (Signed)
-----   Message from Quintella Reichertraci R Turner, MD sent at 02/07/2016  9:54 PM EDT ----- ALT has continued to climb.  Please hold statin and refer to GI ASAP for evaluation.  ? Fatty liver.  Please order abdominal US and hepatitis panel

## 2016-02-19 NOTE — Telephone Encounter (Signed)
Informed patient of results and verbal understanding expressed.  Instructed patient to STOP LIPITOR. GI referral placed for ASAP scheduling. ABD US ordered for scheduling. Patient to have lab work drawn today. She agrees with treatment plan.

## 2016-02-20 ENCOUNTER — Encounter (HOSPITAL_COMMUNITY): Payer: BLUE CROSS/BLUE SHIELD

## 2016-02-20 LAB — HEPATITIS PANEL, ACUTE
HCV AB: NEGATIVE
HEP B C IGM: NONREACTIVE
Hep A IgM: NONREACTIVE
Hepatitis B Surface Ag: NEGATIVE

## 2016-02-22 ENCOUNTER — Encounter (HOSPITAL_COMMUNITY): Payer: BLUE CROSS/BLUE SHIELD

## 2016-02-25 ENCOUNTER — Encounter (HOSPITAL_COMMUNITY): Payer: BLUE CROSS/BLUE SHIELD

## 2016-02-27 ENCOUNTER — Encounter (HOSPITAL_COMMUNITY): Payer: BLUE CROSS/BLUE SHIELD

## 2016-02-27 ENCOUNTER — Other Ambulatory Visit: Payer: BLUE CROSS/BLUE SHIELD

## 2016-02-27 ENCOUNTER — Ambulatory Visit
Admission: RE | Admit: 2016-02-27 | Discharge: 2016-02-27 | Disposition: A | Discharge status: Home / Self Care | Payer: BLUE CROSS/BLUE SHIELD | Source: Ambulatory Visit | Attending: Cardiology | Admitting: Cardiology

## 2016-02-27 DIAGNOSIS — E785 Hyperlipidemia, unspecified: Secondary | ICD-10-CM

## 2016-02-27 DIAGNOSIS — R740 Nonspecific elevation of levels of transaminase and lactic acid dehydrogenase [LDH]: Secondary | ICD-10-CM

## 2016-02-27 DIAGNOSIS — R7401 Elevation of levels of liver transaminase levels: Secondary | ICD-10-CM

## 2016-02-29 ENCOUNTER — Encounter (HOSPITAL_COMMUNITY): Payer: BLUE CROSS/BLUE SHIELD

## 2016-03-03 ENCOUNTER — Encounter (HOSPITAL_COMMUNITY): Payer: BLUE CROSS/BLUE SHIELD

## 2016-03-05 ENCOUNTER — Encounter (HOSPITAL_COMMUNITY): Payer: BLUE CROSS/BLUE SHIELD

## 2016-03-07 ENCOUNTER — Encounter (HOSPITAL_COMMUNITY): Payer: BLUE CROSS/BLUE SHIELD

## 2016-03-10 ENCOUNTER — Encounter (HOSPITAL_COMMUNITY): Payer: BLUE CROSS/BLUE SHIELD

## 2016-03-12 ENCOUNTER — Encounter (HOSPITAL_COMMUNITY): Payer: BLUE CROSS/BLUE SHIELD

## 2016-03-14 ENCOUNTER — Encounter (HOSPITAL_COMMUNITY): Payer: BLUE CROSS/BLUE SHIELD

## 2016-03-17 ENCOUNTER — Encounter (HOSPITAL_COMMUNITY): Payer: BLUE CROSS/BLUE SHIELD

## 2016-03-19 ENCOUNTER — Encounter (HOSPITAL_COMMUNITY): Payer: BLUE CROSS/BLUE SHIELD

## 2016-03-21 ENCOUNTER — Encounter (HOSPITAL_COMMUNITY): Payer: BLUE CROSS/BLUE SHIELD

## 2016-03-21 ENCOUNTER — Encounter: Payer: Self-pay | Admitting: Cardiology

## 2016-08-20 NOTE — Addendum Note (Signed)
Encounter addended by: Jacques EarthlyEdna Brewbaker Myli Pae, RD on: 08/20/2016  3:09 PM<BR>    Actions taken: Flowsheet data copied forward, Visit Navigator Flowsheet section accepted

## 2016-09-05 NOTE — Progress Notes (Signed)
Discharge Summary  Patient Details  Name: Tonya Bryant MRN: [id number] Date of Birth: 1951/04/23 Referring Provider:   Flowsheet Row CARDIAC REHAB [facility]  Referring Provider  Dr. Gevena Cottonraci Turner,MD       Number of Visits: 24  Reason for Discharge:  Early Exit:  Back to work  Smoking History:  History  Smoking Status  . Never Smoker  Smokeless Tobacco  . Not on file    Diagnosis:  No diagnosis found.  ADL UCSD:   Initial Exercise Prescription:   Discharge Exercise Prescription (Final Exercise Prescription Changes):   Functional Capacity:   Psychological, QOL, Others - Outcomes: PHQ 2/9: Depression screen PHQ 2/9 12/17/2015  Decreased Interest 1  Down, Depressed, Hopeless 1  PHQ - 2 Score 2  Altered sleeping 3  Tired, decreased energy 2  Change in appetite 2  Feeling bad or failure about yourself  1  Trouble concentrating 0  Moving slowly or fidgety/restless 0  Suicidal thoughts 0  PHQ-9 Score 10  Difficult doing work/chores Somewhat difficult    Quality of Life:   Personal Goals: Goals established at orientation with interventions provided to work toward goal.    Personal Goals Discharge:   Nutrition & Weight - Outcomes:    Nutrition:   Nutrition Discharge:   Education Questionnaire Score:   Goals reviewed with patient; copy given to patient.

## 2016-09-17 ENCOUNTER — Ambulatory Visit: Payer: BLUE CROSS/BLUE SHIELD | Admitting: Cardiology

## 2016-10-08 ENCOUNTER — Ambulatory Visit (INDEPENDENT_AMBULATORY_CARE_PROVIDER_SITE_OTHER): Payer: BLUE CROSS/BLUE SHIELD | Admitting: Cardiology

## 2016-10-08 ENCOUNTER — Encounter (INDEPENDENT_AMBULATORY_CARE_PROVIDER_SITE_OTHER): Payer: Self-pay

## 2016-10-08 ENCOUNTER — Telehealth: Payer: Self-pay

## 2016-10-08 VITALS — BP 134/82 | HR 65 | Ht 60.0 in | Wt 203.8 lb

## 2016-10-08 DIAGNOSIS — I10 Essential (primary) hypertension: Secondary | ICD-10-CM

## 2016-10-08 DIAGNOSIS — E785 Hyperlipidemia, unspecified: Secondary | ICD-10-CM | POA: Diagnosis not present

## 2016-10-08 DIAGNOSIS — I2510 Atherosclerotic heart disease of native coronary artery without angina pectoris: Secondary | ICD-10-CM

## 2016-10-08 DIAGNOSIS — R079 Chest pain, unspecified: Secondary | ICD-10-CM

## 2016-10-08 MED ORDER — CLOPIDOGREL BISULFATE 75 MG PO TABS: 75.0000 mg | ORAL_TABLET | Freq: Every day | ORAL | 3 refills | Status: DC

## 2016-10-08 MED ORDER — ISOSORBIDE MONONITRATE ER 60 MG PO TB24: 60.0000 mg | ORAL_TABLET | Freq: Every day | ORAL | 11 refills | Status: DC

## 2016-10-08 NOTE — Telephone Encounter (Signed)
At OV today, patient reported Dr. Manus GunningEhinger stopped her atorvastatin. Called Dr. Randel BooksEhinger's nurse. Left message for her to call back to discuss why the medication was stopped.

## 2016-10-08 NOTE — Telephone Encounter (Signed)
Follow Up ° ° °Pt returning phone call from nurse. Requesting call back °

## 2016-10-08 NOTE — Progress Notes (Signed)
Cardiology Office Note    Date:  10/08/2016   ID:  Tonya Bryant, DOB Feb 01, 1951, MRN [id number]  PCP:  Thora LanceEHINGER,ROBERT R, MD  Cardiologist:  Armanda Magicraci Turner, MD   Chief Complaint  Patient presents with  . Coronary Artery Disease  . Hypertension  . Hyperlipidemia    History of Present Illness:  Tonya Bryant is a 66 y.o. female with history of CAD and implantation of drug-eluting stents in the mid circumflex and distal LAD on 11/01/15. Residual 65% first diagonal stenosis was also noted. She is on ASA, Brilinta, lipitor 80.  She is doing well today but has been having some intermittent CP similar to her prior angina.She describes the pain as a aching on the left side of her chest that lasts a few minutes and resolves. She denies any nausea but does get mildly diaphoretic.  She has not take any NTG for it. She did run out of her Brilinta for 3 weeks last month but is taking it now.   She does get DOE with climbing the stairs which is also new for her. She denies any  dizziness, palpitations or syncope.  She occasionally has some mild ankle edema at night after being on her feet all day.  She does salt her food some while cooking.   Past Medical History:  Diagnosis Date  . Anxiety   . CAD (coronary artery disease), native coronary artery 11/02/2015   Ost RCA lesion, 30% stenosed, Prox LAD lesion, 50% stenosed, 1st Diag lesion, 65% stenosed, Ramus lesion, 50% stenosed, Mid Cx lesion, 80% stenosed, Dist LAD lesion, 75% stenosed. S/P DES to distal LAD and mid Cx 10/2015  . Depression (emotion)   . Dyslipidemia, goal LDL below 70 02/07/2016  . Full dentures   . Hypertension     Past Surgical History:  Procedure Laterality Date  . BREAST SURGERY     lt br bx-neg  . CARDIAC CATHETERIZATION N/A 11/01/2015   Procedure: Left Heart Cath and Coronary Angiography;  Surgeon: Tonny BollmanMichael Cooper, MD;  [facility];  Service: Cardiovascular;  Laterality: N/A;  . CARPAL TUNNEL RELEASE Left  07/07/2013   Procedure: LEFT CARPAL TUNNEL RELEASE;  Surgeon: Wyn Forsterobert V Sypher Jr., MD;  Location: [facility];  Service: Orthopedics;  Laterality: Left;  . COLONOSCOPY    . TOE OSTEOTOMY     rt big toe  . TOOTH EXTRACTION    . TRIGGER FINGER RELEASE Left 07/07/2013   Procedure: LEFT THUMB RELEASE A-1 PULLEY;  Surgeon: Wyn Forsterobert V Sypher Jr., MD;  Location: [facility];  Service: Orthopedics;  Laterality: Left;  . TUBAL LIGATION      Current Medications: Outpatient Medications Prior to Visit  Medication Sig Dispense Refill  . amLODipine (NORVASC) 10 MG tablet Take 1 tablet (10 mg total) by mouth daily. 90 tablet 0  . aspirin 81 MG tablet Take 1 tablet (81 mg total) by mouth daily. 90 tablet 0  . citalopram (CELEXA) 40 MG tablet Take 0.5 tablets (20 mg total) by mouth daily.    . isosorbide mononitrate (IMDUR) 30 MG 24 hr tablet Take 1 tablet (30 mg total) by mouth daily. 30 tablet 11  . lisinopril (PRINIVIL,ZESTRIL) 20 MG tablet Take 1 tablet (20 mg total) by mouth daily. 30 tablet 0  . nitroGLYCERIN (NITROSTAT) 0.4 MG SL tablet Place 1 tablet (0.4 mg total) under the tongue every 5 (five) minutes as needed for chest pain (for 3 doses only). 25 tablet  1  . pantoprazole (PROTONIX) 40 MG tablet Take 1 tablet (40 mg total) by mouth daily at 12 noon. (Patient taking differently: Take 40 mg by mouth daily at 6 PM. ) 90 tablet 0  . ticagrelor (BRILINTA) 90 MG TABS tablet Take 1 tablet (90 mg total) by mouth 2 (two) times daily. 180 tablet 3   No facility-administered medications prior to visit.      Allergies:   Patient has no known allergies.   Social History   Social History  . Marital status: Single    Spouse name: N/A  . Number of children: N/A  . Years of education: N/A   Social History Main Topics  . Smoking status: Never Smoker  . Smokeless tobacco: Not on file  . Alcohol use 0.5 oz/week    1 drink(s) per week  . Drug use: No  . Sexual activity: Not  on file   Other Topics Concern  . Not on file   Social History Narrative  . No narrative on file     Family History:  The patient's family history includes Cancer in her mother; Diabetes in her father and mother; Gout in her father; Hypertension in her father and mother; Stroke in her father and mother.   ROS:   Please see the history of present illness.    ROS All other systems reviewed and are negative.  No flowsheet data found.     PHYSICAL EXAM:   VS:  BP 134/82   Pulse 65   Ht 5' (1.524 m)   Wt 203 lb 12.8 oz (92.4 kg)   BMI 39.80 kg/m    GEN: Well nourished, well developed, in no acute distress  HEENT: normal  Neck: no JVD, carotid bruits, or masses Cardiac: RRR; no murmurs, rubs, or gallops,no edema.  Intact distal pulses bilaterally.  Respiratory:  clear to auscultation bilaterally, normal work of breathing GI: soft, nontender, nondistended, + BS MS: no deformity or atrophy  Skin: warm and dry, no rash Neuro:  Alert and Oriented x 3, Strength and sensation are intact Psych: euthymic mood, full affect  Wt Readings from Last 3 Encounters:  10/08/16 203 lb 12.8 oz (92.4 kg)  02/07/16 205 lb 6.4 oz (93.2 kg)  12/13/15 206 lb 9.1 oz (93.7 kg)      Studies/Labs Reviewed:   EKG:  EKG is ordered today.  The ekg ordered today demonstrates NSR at 65bpm with normal intervals and no ST changes  Recent Labs: 11/21/2015: B Natriuretic Peptide 34.4; Magnesium 2.0; TSH 1.803 11/23/2015: BUN 14; Creatinine, Ser 0.90; Hemoglobin 10.8; Platelets 307; Potassium 3.9; Sodium 139 02/07/2016: ALT 123   Lipid Panel    Component Value Date/Time   CHOL 119 (L) 01/07/2016 0930   TRIG 52 01/07/2016 0930   HDL 55 01/07/2016 0930   CHOLHDL 2.2 01/07/2016 0930   VLDL 10 01/07/2016 0930   LDLCALC 54 01/07/2016 0930    Additional studies/ records that were reviewed today include:      ASSESSMENT:    1. Coronary artery disease involving native coronary artery of native heart  without angina pectoris   2. Essential hypertension   3. Dyslipidemia, goal LDL below 70      PLAN:  In order of problems listed above:  1. ASCAD s/p cath with CAD with DES to the mid circumflex and distal LAD on 11/01/15. Residual 65% first diagonal stenosis was also noted.  She is now having recurrent anginal symptoms.  This could be due to  restenosis but also could be due to progression of her other CAD.  I will get a stress myoview to rule out ischemia.  I have asked her to increased Imdur to 60mg  daily. She will continue on ASA and Brilinta.  She says that the Marden Noble is very pricey for her so I will change her to Plavix 75mg  daily.   2. HTN - BP controlled on current meds.  Continue amlodipine/ACE I 3. Hyperlipidemia with LDL goal < 70.     Medication Adjustments/Labs and Tests Ordered: Current medicines are reviewed at length with the patient today.  Concerns regarding medicines are outlined above.  Medication changes, Labs and Tests ordered today are listed in the Patient Instructions below.  There are no Patient Instructions on file for this visit.   Signed, Armanda Magic, MD  10/08/2016 8:36 AM    [facility] [address] [place], [place], [place]  [address] Phone: [phone]; Fax: [phone]

## 2016-10-08 NOTE — Telephone Encounter (Signed)
Irving Burtonmily called to report that she spoke with Dr. Manus GunningEhinger and he did not stop the patient's statin.

## 2016-10-08 NOTE — Patient Instructions (Addendum)
Medication Instructions:  Your physician recommends that you continue on your current medications as directed. Please refer to the Current Medication list given to you today.   Labwork: FASTING labs the same day as your stress test: BMET, LFTs, Lipids  Testing/Procedures: Dr. Mayford Knifeurner recommends you have a NUCLEAR STRESS TEST.  Follow-Up: Your physician recommends that you schedule a follow-up appointment in 2 weeks with Dr. Norris Crossurner's assistant.  Your physician wants you to follow-up in: 6 months with Dr. Mayford Knifeurner. You will receive a reminder letter in the mail two months in advance. If you don't receive a letter, please call our office to schedule the follow-up appointment.   Any Other Special Instructions Will Be Listed Below (If Applicable).     If you need a refill on your cardiac medications before your next appointment, please call your pharmacy.

## 2016-10-08 NOTE — Telephone Encounter (Signed)
Nuc med called patient to reschedule to 2 day myoview.

## 2016-10-09 NOTE — Telephone Encounter (Signed)
[facility]. Per pharmacy technician, the last time the patient was dispensed Lipitor was July of 2017 for 30 days. She does not report a reason why it was stopped.  The patient's PCP said he did not stop her statin. The patient thinks she may have had side effects and stopped it but she doesn't remember.  To [facility] for statin recommendations.

## 2016-10-10 NOTE — Telephone Encounter (Signed)
If pt thinks she had side effects with Lipitor 80mg  daily, would give her the option of trying a lower dose of Lipitor 40mg  daily or trying Crestor 40mg  daily.

## 2016-10-13 ENCOUNTER — Other Ambulatory Visit: Payer: BLUE CROSS/BLUE SHIELD

## 2016-10-13 ENCOUNTER — Encounter (HOSPITAL_COMMUNITY): Payer: BLUE CROSS/BLUE SHIELD

## 2016-10-14 NOTE — Telephone Encounter (Signed)
Patient is hesitant to start statin before her cholesterol is checked again since she cannot remember why she stopped it. Patient is scheduled for fasting lab work 2/26. She understands she will be called with results and to discuss statin therapy after results are complete. She was grateful for call.

## 2016-10-16 ENCOUNTER — Telehealth (HOSPITAL_COMMUNITY): Payer: Self-pay | Admitting: *Deleted

## 2016-10-16 NOTE — Telephone Encounter (Signed)
Patient given detailed instructions per Myocardial Perfusion Study Information Sheet for the test on 10/21/15 Patient notified to arrive 15 minutes early and that it is imperative to arrive on time for appointment to keep from having the test rescheduled.  If you need to cancel or reschedule your appointment, please call the office within 24 hours of your appointment. Failure to do so may result in a cancellation of your appointment, and a $50 no show fee. Patient verbalized understanding. Ricky AlaSmith, Sharian Delia Jacqueline

## 2016-10-20 ENCOUNTER — Ambulatory Visit (HOSPITAL_COMMUNITY): Payer: BLUE CROSS/BLUE SHIELD | Attending: Cardiovascular Disease

## 2016-10-20 ENCOUNTER — Other Ambulatory Visit: Payer: BLUE CROSS/BLUE SHIELD | Admitting: *Deleted

## 2016-10-20 DIAGNOSIS — R079 Chest pain, unspecified: Secondary | ICD-10-CM

## 2016-10-20 DIAGNOSIS — I10 Essential (primary) hypertension: Secondary | ICD-10-CM

## 2016-10-20 DIAGNOSIS — E785 Hyperlipidemia, unspecified: Secondary | ICD-10-CM

## 2016-10-20 LAB — LIPID PANEL
CHOLESTEROL TOTAL: 216 mg/dL — AB (ref 100–199)
Chol/HDL Ratio: 3.1 ratio units (ref 0.0–4.4)
HDL: 69 mg/dL (ref >39)
LDL Calculated: 136 mg/dL — ABNORMAL HIGH (ref 0–99)
Triglycerides: 56 mg/dL (ref 0–149)
VLDL CHOLESTEROL CAL: 11 mg/dL (ref 5–40)

## 2016-10-20 LAB — BASIC METABOLIC PANEL
BUN / CREAT RATIO: 13 (ref 12–28)
BUN: 11 mg/dL (ref 8–27)
CHLORIDE: 101 mmol/L (ref 96–106)
CO2: 22 mmol/L (ref 18–29)
Calcium: 9 mg/dL (ref 8.7–10.3)
Creatinine, Ser: 0.87 mg/dL (ref 0.57–1.00)
GFR calc Af Amer: 81 mL/min/{1.73_m2} (ref >59)
GFR calc non Af Amer: 71 mL/min/{1.73_m2} (ref >59)
GLUCOSE: 95 mg/dL (ref 65–99)
Potassium: 4.3 mmol/L (ref 3.5–5.2)
SODIUM: 139 mmol/L (ref 134–144)

## 2016-10-20 LAB — HEPATIC FUNCTION PANEL
ALBUMIN: 4.3 g/dL (ref 3.6–4.8)
ALK PHOS: 70 IU/L (ref 39–117)
ALT: 21 IU/L (ref 0–32)
AST: 15 IU/L (ref 0–40)
BILIRUBIN TOTAL: 0.3 mg/dL (ref 0.0–1.2)
Bilirubin, Direct: 0.06 mg/dL (ref 0.00–0.40)
Total Protein: 7.1 g/dL (ref 6.0–8.5)

## 2016-10-20 MED ORDER — TECHNETIUM TC 99M TETROFOSMIN IV KIT
33.0000 | PACK | Freq: Once | INTRAVENOUS | Status: AC | PRN
  Administered 2016-10-20: 33 via INTRAVENOUS
  Filled 2016-10-20: qty 33

## 2016-10-21 ENCOUNTER — Ambulatory Visit (HOSPITAL_COMMUNITY): Payer: BLUE CROSS/BLUE SHIELD | Attending: Cardiovascular Disease

## 2016-10-21 MED ORDER — TECHNETIUM TC 99M TETROFOSMIN IV KIT
33.0000 | PACK | Freq: Once | INTRAVENOUS | Status: AC | PRN
  Administered 2016-10-21: 33 via INTRAVENOUS
  Filled 2016-10-21: qty 33

## 2016-10-22 LAB — MYOCARDIAL PERFUSION IMAGING
CHL CUP MPHR: 156 {beats}/min
CHL CUP NUCLEAR SRS: 2
CHL CUP NUCLEAR SSS: 12
CHL CUP RESTING HR STRESS: 62 {beats}/min
CSEPED: 6 min
CSEPEDS: 0 s
Estimated workload: 7 METS
LV dias vol: 62 mL (ref 46–106)
LV sys vol: 20 mL
NUC STRESS TID: 1.05
Peak HR: 141 {beats}/min
Percent HR: 90 %
RATE: 0.24
SDS: 10

## 2016-10-23 ENCOUNTER — Telehealth: Payer: Self-pay

## 2016-10-23 DIAGNOSIS — E785 Hyperlipidemia, unspecified: Secondary | ICD-10-CM

## 2016-10-23 NOTE — Telephone Encounter (Signed)
-----   Message from Quintella Reichertraci R Turner, MD sent at 10/20/2016  8:02 PM EST ----- LDL not at goal.  Recommend starting Crestor 40mg  daily and repeat FLP and ALT in 6 weeks

## 2016-10-23 NOTE — Telephone Encounter (Signed)
Informed patient of results and verbal understanding expressed.  Patient refuses to start statin at this time. She will work on diet and exercise and will repeat fasting labs in 4 months. FLP and ALT ordered for scheduling and recall placed.

## 2016-10-24 ENCOUNTER — Ambulatory Visit: Payer: BLUE CROSS/BLUE SHIELD | Admitting: Cardiology

## 2016-11-04 ENCOUNTER — Other Ambulatory Visit: Payer: Self-pay | Admitting: Family Medicine

## 2016-11-04 DIAGNOSIS — Z1231 Encounter for screening mammogram for malignant neoplasm of breast: Secondary | ICD-10-CM

## 2016-11-13 ENCOUNTER — Ambulatory Visit (INDEPENDENT_AMBULATORY_CARE_PROVIDER_SITE_OTHER): Payer: BLUE CROSS/BLUE SHIELD | Admitting: Cardiology

## 2016-11-13 ENCOUNTER — Encounter: Payer: Self-pay | Admitting: Cardiology

## 2016-11-13 ENCOUNTER — Encounter (INDEPENDENT_AMBULATORY_CARE_PROVIDER_SITE_OTHER): Payer: Self-pay

## 2016-11-13 VITALS — BP 132/84 | HR 67 | Ht 60.0 in | Wt 200.8 lb

## 2016-11-13 DIAGNOSIS — I10 Essential (primary) hypertension: Secondary | ICD-10-CM | POA: Diagnosis not present

## 2016-11-13 DIAGNOSIS — I25118 Atherosclerotic heart disease of native coronary artery with other forms of angina pectoris: Secondary | ICD-10-CM | POA: Diagnosis not present

## 2016-11-13 DIAGNOSIS — E785 Hyperlipidemia, unspecified: Secondary | ICD-10-CM

## 2016-11-13 MED ORDER — PANTOPRAZOLE SODIUM 40 MG PO TBEC: 40.0000 mg | DELAYED_RELEASE_TABLET | Freq: Two times a day (BID) | ORAL | 11 refills | Status: DC

## 2016-11-13 MED ORDER — ROSUVASTATIN CALCIUM 40 MG PO TABS: 40.0000 mg | ORAL_TABLET | Freq: Every day | ORAL | 11 refills | Status: DC

## 2016-11-13 NOTE — Patient Instructions (Signed)
Medication Instructions:  1) INCREASE PROTONIX to 40 mg TWICE DAILY 2) START CRESTOR 40 mg daily  Labwork: Your physician recommends that you return for FASTING lab work in 6-8 WEEKS.   Testing/Procedures: None  Follow-Up: Your physician recommends that you schedule a follow-up appointment in 4 WEEKS with Dr. Norris Crossurner's assistant.  Your physician wants you to follow-up in: 6 months with Dr. Mayford Knifeurner. You will receive a reminder letter in the mail two months in advance. If you don't receive a letter, please call our office to schedule the follow-up appointment.   Any Other Special Instructions Will Be Listed Below (If Applicable).     If you need a refill on your cardiac medications before your next appointment, please call your pharmacy.

## 2016-11-13 NOTE — Progress Notes (Signed)
Cardiology Office Note    Date:  11/13/2016   ID:  [name], DOB 07-04-1951, MRN [id number]  PCP:  Thora Lance, MD  Cardiologist:  Armanda Magic, MD   Chief Complaint  Patient presents with  . Coronary Artery Disease  . Hypertension  . Hyperlipidemia    History of Present Illness:  Tonya Bryant is a 66 y.o. female with history of CAD s/p  drug-eluting stents in the mid circumflex and distal LAD on 11/01/15. Cath showed residual 65% first diagonal stenosis as well. She is now here for followup.  When I saw her last she was complaining of CP similar to her angina. Her Imdur was increased to 60mg  daily and she was also changed to Plavix due to cost of Brilinta.  Nuclear stress test was done which showed no ischemia.  She is doing well.  She says that she woke up once last week with some discomfort in her chest and right arm.  She rubbed her arm and the discomfort went away.  She says that the pain was different from her typical angina.  She notices SOB when she is rushing around but dose not limit her daily activities.  She occasionally will have some LE edema after being on her feet when she is at work but on the days she does not work it is not there.  She denies any palpitations or syncope.    Past Medical History:  Diagnosis Date  . Anxiety   . CAD (coronary artery disease), native coronary artery 11/02/2015   Ost RCA lesion, 30% stenosed, Prox LAD lesion, 50% stenosed, 1st Diag lesion, 65% stenosed, Ramus lesion, 50% stenosed, Mid Cx lesion, 80% stenosed, Dist LAD lesion, 75% stenosed. S/P DES to distal LAD and mid Cx 10/2015  . Depression (emotion)   . Dyslipidemia, goal LDL below 70 02/07/2016  . Full dentures   . Hypertension     Past Surgical History:  Procedure Laterality Date  . BREAST SURGERY     lt br bx-neg  . CARDIAC CATHETERIZATION N/A 11/01/2015   Procedure: Left Heart Cath and Coronary Angiography;  Surgeon: Tonny Bollman, MD;  Location: [facility] INVASIVE CV  LAB;  Service: Cardiovascular;  Laterality: N/A;  . CARPAL TUNNEL RELEASE Left 07/07/2013   Procedure: LEFT CARPAL TUNNEL RELEASE;  Surgeon: Wyn Forster., MD;  Location: [facility];  Service: Orthopedics;  Laterality: Left;  . COLONOSCOPY    . TOE OSTEOTOMY     rt big toe  . TOOTH EXTRACTION    . TRIGGER FINGER RELEASE Left 07/07/2013   Procedure: LEFT THUMB RELEASE A-1 PULLEY;  Surgeon: Wyn Forster., MD;  Location: [facility];  Service: Orthopedics;  Laterality: Left;  . TUBAL LIGATION      Current Medications: Current Meds  Medication Sig  . amLODipine (NORVASC) 10 MG tablet Take 1 tablet (10 mg total) by mouth daily.  Marland Kitchen aspirin 81 MG tablet Take 1 tablet (81 mg total) by mouth daily.  . citalopram (CELEXA) 40 MG tablet Take 0.5 tablets (20 mg total) by mouth daily.  . clopidogrel (PLAVIX) 75 MG tablet Take 1 tablet (75 mg total) by mouth daily.  . isosorbide mononitrate (IMDUR) 60 MG 24 hr tablet Take 1 tablet (60 mg total) by mouth daily.  Marland Kitchen lisinopril (PRINIVIL,ZESTRIL) 20 MG tablet Take 1 tablet (20 mg total) by mouth daily.  . nitroGLYCERIN (NITROSTAT) 0.4 MG SL tablet Place 1 tablet (0.4 mg total) under  the tongue every 5 (five) minutes as needed for chest pain (for 3 doses only).  . pantoprazole (PROTONIX) 40 MG tablet Take 1 tablet (40 mg total) by mouth daily at 12 noon. (Patient taking differently: Take 40 mg by mouth daily at 6 PM. )    Allergies:   Patient has no known allergies.   Social History   Social History  . Marital status: Single    Spouse name: N/A  . Number of children: N/A  . Years of education: N/A   Social History Main Topics  . Smoking status: Never Smoker  . Smokeless tobacco: Never Used  . Alcohol use 0.5 oz/week    1 Standard drinks or equivalent per week  . Drug use: No  . Sexual activity: Not Asked   Other Topics Concern  . None   Social History Narrative  . None     Family History:  The  patient's family history includes Cancer in her mother; Diabetes in her father and mother; Gout in her father; Hypertension in her father and mother; Stroke in her father and mother.   ROS:   Please see the history of present illness.    ROS All other systems reviewed and are negative.  No flowsheet data found.     PHYSICAL EXAM:   VS:  BP 132/84   Pulse 67   Ht 5' (1.524 m)   Wt 200 lb 12.8 oz (91.1 kg)   SpO2 95%   BMI 39.22 kg/m    GEN: Well nourished, well developed, in no acute distress  HEENT: normal  Neck: no JVD, carotid bruits, or masses Cardiac: RRR; no murmurs, rubs, or gallops,no edema.  Intact distal pulses bilaterally.  Respiratory:  clear to auscultation bilaterally, normal work of breathing GI: soft, nontender, nondistended, + BS MS: no deformity or atrophy  Skin: warm and dry, no rash Neuro:  Alert and Oriented x 3, Strength and sensation are intact Psych: euthymic mood, full affect  Wt Readings from Last 3 Encounters:  11/13/16 200 lb 12.8 oz (91.1 kg)  10/20/16 203 lb (92.1 kg)  10/08/16 203 lb 12.8 oz (92.4 kg)      Studies/Labs Reviewed:   EKG:  EKG is not ordered today.    Recent Labs: 11/21/2015: B Natriuretic Peptide 34.4; Magnesium 2.0; TSH 1.803 11/23/2015: Hemoglobin 10.8; Platelets 307 10/20/2016: ALT 21; BUN 11; Creatinine, Ser 0.87; Potassium 4.3; Sodium 139   Lipid Panel    Component Value Date/Time   CHOL 216 (H) 10/20/2016 1136   TRIG 56 10/20/2016 1136   HDL 69 10/20/2016 1136   CHOLHDL 3.1 10/20/2016 1136   CHOLHDL 2.2 01/07/2016 0930   VLDL 10 01/07/2016 0930   LDLCALC 136 (H) 10/20/2016 1136    Additional studies/ records that were reviewed today include:  none    ASSESSMENT:    1. Coronary artery disease of native artery of native heart with stable angina pectoris (HCC)   2. Essential hypertension   3. Dyslipidemia, goal LDL below 70      PLAN:  In order of problems listed above:  1. ASCAD s/p cath with mid  LCx and distal LAD with residual disease of 65% in the Diag.  She has only had 1 episode of chest and arm discomfort since I saw her and that was while in bed and went away with massage.  She says it was not like her typical angina.  Her recent nuclear stress test was normal.  She will continue on  ASA and Plavix and long acting nitrate. She has been resistant to statin therapy in the past but I talked with her at length today about progression of CAD if LDL is not controlled.  She agree to start high dose statin therapy.   2. HTN - BP controlled on current meds. She will continue on amlodipine and ACE I.   3. Hyperlipidemia with LDL gola < 70.  She now agrees to start statin therapy.  Will start Crestor 40mg  daily and repeat FLP and ALT in6 weeks.    4.   GERD - she is currently on Protonix 40mg  daily but I suspect she may be having more GERD symptoms at night causing her chest discomfort so I have asked her to increase to 40mg  BID.     Medication Adjustments/Labs and Tests Ordered: Current medicines are reviewed at length with the patient today.  Concerns regarding medicines are outlined above.  Medication changes, Labs and Tests ordered today are listed in the Patient Instructions below.  There are no Patient Instructions on file for this visit.   Signed, Armanda Magic, MD  11/13/2016 8:27 AM    [facility] [address] [place], [place], [place]  [address] Phone: [phone]; Fax: [phone]

## 2016-11-21 ENCOUNTER — Ambulatory Visit
Admission: RE | Admit: 2016-11-21 | Discharge: 2016-11-21 | Disposition: A | Discharge status: Home / Self Care | Payer: BLUE CROSS/BLUE SHIELD | Source: Ambulatory Visit | Attending: Family Medicine | Admitting: Family Medicine

## 2016-11-21 DIAGNOSIS — Z1231 Encounter for screening mammogram for malignant neoplasm of breast: Secondary | ICD-10-CM

## 2016-11-28 ENCOUNTER — Encounter: Payer: Self-pay | Admitting: Cardiology

## 2016-12-03 ENCOUNTER — Telehealth: Payer: Self-pay | Admitting: Cardiology

## 2016-12-03 NOTE — Telephone Encounter (Signed)
Patient reports she started Protonic 40 mg BID at last visit, but started feeling "bad" shortly afterward. She cannot describe the feelings she experienced.  She decreased Protonix to 40 mg daily and she felt much better. She stopped eating late at night and her indigestion went away. Instructed patient to call if symptoms return. She was grateful for call.  Med list updated.

## 2016-12-03 NOTE — Telephone Encounter (Signed)
New Message   pt verbalized that she is returning call for rn   She did not want to disclose any information

## 2016-12-16 ENCOUNTER — Ambulatory Visit: Payer: BLUE CROSS/BLUE SHIELD | Admitting: Cardiology

## 2016-12-25 ENCOUNTER — Other Ambulatory Visit: Payer: BLUE CROSS/BLUE SHIELD

## 2017-01-01 ENCOUNTER — Encounter: Payer: Self-pay | Admitting: Cardiology

## 2017-01-05 ENCOUNTER — Ambulatory Visit (INDEPENDENT_AMBULATORY_CARE_PROVIDER_SITE_OTHER): Payer: BLUE CROSS/BLUE SHIELD | Admitting: Cardiology

## 2017-01-05 ENCOUNTER — Other Ambulatory Visit: Payer: BLUE CROSS/BLUE SHIELD | Admitting: *Deleted

## 2017-01-05 ENCOUNTER — Encounter: Payer: Self-pay | Admitting: Cardiology

## 2017-01-05 VITALS — BP 133/80 | HR 65 | Resp 16 | Ht 60.0 in | Wt 195.0 lb

## 2017-01-05 DIAGNOSIS — E785 Hyperlipidemia, unspecified: Secondary | ICD-10-CM

## 2017-01-05 DIAGNOSIS — I25118 Atherosclerotic heart disease of native coronary artery with other forms of angina pectoris: Secondary | ICD-10-CM

## 2017-01-05 DIAGNOSIS — E782 Mixed hyperlipidemia: Secondary | ICD-10-CM

## 2017-01-05 DIAGNOSIS — I10 Essential (primary) hypertension: Secondary | ICD-10-CM

## 2017-01-05 LAB — LIPID PANEL
Chol/HDL Ratio: 3.1 ratio (ref 0.0–4.4)
Cholesterol, Total: 199 mg/dL (ref 100–199)
HDL: 64 mg/dL (ref >39)
LDL Calculated: 120 mg/dL — ABNORMAL HIGH (ref 0–99)
TRIGLYCERIDES: 77 mg/dL (ref 0–149)
VLDL CHOLESTEROL CAL: 15 mg/dL (ref 5–40)

## 2017-01-05 LAB — BASIC METABOLIC PANEL
BUN/Creatinine Ratio: 15 (ref 12–28)
BUN: 13 mg/dL (ref 8–27)
CALCIUM: 9.3 mg/dL (ref 8.7–10.3)
CHLORIDE: 103 mmol/L (ref 96–106)
CO2: 22 mmol/L (ref 18–29)
Creatinine, Ser: 0.87 mg/dL (ref 0.57–1.00)
GFR calc Af Amer: 81 mL/min/{1.73_m2} (ref >59)
GFR calc non Af Amer: 71 mL/min/{1.73_m2} (ref >59)
GLUCOSE: 89 mg/dL (ref 65–99)
POTASSIUM: 4.5 mmol/L (ref 3.5–5.2)
SODIUM: 139 mmol/L (ref 134–144)

## 2017-01-05 LAB — HEPATIC FUNCTION PANEL
ALBUMIN: 4.3 g/dL (ref 3.6–4.8)
ALT: 19 IU/L (ref 0–32)
AST: 15 IU/L (ref 0–40)
Alkaline Phosphatase: 75 IU/L (ref 39–117)
BILIRUBIN TOTAL: 0.4 mg/dL (ref 0.0–1.2)
Bilirubin, Direct: 0.12 mg/dL (ref 0.00–0.40)
TOTAL PROTEIN: 7.1 g/dL (ref 6.0–8.5)

## 2017-01-05 NOTE — Patient Instructions (Signed)
Medication Instructions:   Your physician recommends that you continue on your current medications as directed. Please refer to the Current Medication list given to you today.   If you need a refill on your cardiac medications before your next appointment, please call your pharmacy.  Labwork: NONE ORDERED  TODAY    Testing/Procedures: NONE ORDERED  TODAY    Follow-Up: SEE DR TURNER IN  SEPTEMEBER  Any Other Special Instructions Will Be Listed Below (If Applicable).

## 2017-01-05 NOTE — Progress Notes (Signed)
01/05/2017 Tonya Bryant   [birth year]  [id number]  Primary Physician Blair HeysEhinger, Robert, MD Primary Cardiologist: Dr. Mayford Knifeurner   Reason for Visit/CC: F/u for CP and HLD  HPI:  Tonya Bryant is a 66 y.o. female who is being seen today for follow-up for atypical chest pain and HLD. She is followed by  Dr. Mayford Knifeurner. She has a h/o CAD s/p  drug-eluting stents in the mid circumflex and distal LAD on 11/01/15. Cath showed residual 65% first diagonal stenosis as well. Following her stent, she developed recurrent CP, similar to her previous angina. Her Imdur was increased to 60mg  daily and she was also changed to Plavix due to cost of Brilinta.  Nuclear stress test was done 10/20/16 which showed no ischemia.    On 11/13/16, she was seen again for f/u and she complained of continued chest pain c/w GERD. Dr. Mayford Knifeurner elected to increase her Protonix from 40 mg once daily to BID dosing. She also added Crestor for high LDL at 136. Dr. Mayford Knifeurner advised that she return in 6 weeks for repeat FLP and HFTs as well as office f/u with APP.  Her labs were collected this morning. Results are pending. She reports compliance with meds. She did not tolerate BID dosing of Protonix. She noted increased fatigue with this. Her CP however has since resolved. VSS. No complaints today.     Current Meds  Medication Sig  . amLODipine (NORVASC) 10 MG tablet Take 1 tablet (10 mg total) by mouth daily. (Patient taking differently: Take 10 mg by mouth daily. Blood pressure)  . aspirin 81 MG tablet Take 1 tablet (81 mg total) by mouth daily.  . citalopram (CELEXA) 40 MG tablet Take 0.5 tablets (20 mg total) by mouth daily. (Patient taking differently: Take 20 mg by mouth daily. Anxiety and depression)  . clopidogrel (PLAVIX) 75 MG tablet Take 1 tablet (75 mg total) by mouth daily. (Patient taking differently: Take 75 mg by mouth daily. Blood Thinner)  . isosorbide mononitrate (IMDUR) 60 MG 24 hr tablet Take 1 tablet (60 mg total) by mouth  daily. (Patient taking differently: Take 60 mg by mouth daily. Chest pressure)  . lisinopril (PRINIVIL,ZESTRIL) 20 MG tablet Take 1 tablet (20 mg total) by mouth daily. (Patient taking differently: Take 20 mg by mouth daily. Blood pressure)  . nitroGLYCERIN (NITROSTAT) 0.4 MG SL tablet Place 1 tablet (0.4 mg total) under the tongue every 5 (five) minutes as needed for chest pain (for 3 doses only).  . pantoprazole (PROTONIX) 40 MG tablet Take 40 mg by mouth daily. Heart burn  . rosuvastatin (CRESTOR) 40 MG tablet Take 1 tablet (40 mg total) by mouth daily. (Patient taking differently: Take 40 mg by mouth daily. Cholesterol)   No Known Allergies Past Medical History:  Diagnosis Date  . Anxiety   . CAD (coronary artery disease), native coronary artery 11/02/2015   Ost RCA lesion, 30% stenosed, Prox LAD lesion, 50% stenosed, 1st Diag lesion, 65% stenosed, Ramus lesion, 50% stenosed, Mid Cx lesion, 80% stenosed, Dist LAD lesion, 75% stenosed. S/P DES to distal LAD and mid Cx 10/2015  . Depression (emotion)   . Dyslipidemia, goal LDL below 70 02/07/2016  . Full dentures   . Hypertension    Family History  Problem Relation Age of Onset  . Cancer Mother   . Diabetes Mother   . Stroke Mother   . Hypertension Mother   . Stroke Father   . Diabetes Father   . Hypertension Father   .  Gout Father    Past Surgical History:  Procedure Laterality Date  . BREAST EXCISIONAL BIOPSY Left   . BREAST SURGERY     lt br bx-neg  . CARDIAC CATHETERIZATION N/A 11/01/2015   Procedure: Left Heart Cath and Coronary Angiography;  Surgeon: Tonny Bollman, MD;  Location: [facility] INVASIVE CV LAB;  Service: Cardiovascular;  Laterality: N/A;  . CARPAL TUNNEL RELEASE Left 07/07/2013   Procedure: LEFT CARPAL TUNNEL RELEASE;  Surgeon: Wyn Forster., MD;  Location: [facility];  Service: Orthopedics;  Laterality: Left;  . COLONOSCOPY    . TOE OSTEOTOMY     rt big toe  . TOOTH EXTRACTION    . TRIGGER  FINGER RELEASE Left 07/07/2013   Procedure: LEFT THUMB RELEASE A-1 PULLEY;  Surgeon: Wyn Forster., MD;  Location: [facility];  Service: Orthopedics;  Laterality: Left;  . TUBAL LIGATION     Social History   Social History  . Marital status: Single    Spouse name: N/A  . Number of children: N/A  . Years of education: N/A   Occupational History  . Not on file.   Social History Main Topics  . Smoking status: Never Smoker  . Smokeless tobacco: Never Used  . Alcohol use 0.5 oz/week    1 Standard drinks or equivalent per week  . Drug use: No  . Sexual activity: Not on file   Other Topics Concern  . Not on file   Social History Narrative  . No narrative on file     Review of Systems: General: negative for chills, fever, night sweats or weight changes.  Cardiovascular: negative for chest pain, dyspnea on exertion, edema, orthopnea, palpitations, paroxysmal nocturnal dyspnea or shortness of breath Dermatological: negative for rash Respiratory: negative for cough or wheezing Urologic: negative for hematuria Abdominal: negative for nausea, vomiting, diarrhea, bright red blood per rectum, melena, or hematemesis Neurologic: negative for visual changes, syncope, or dizziness All other systems reviewed and are otherwise negative except as noted above.   Physical Exam:  Blood pressure 133/80, pulse 65, resp. rate 16, height 5' (1.524 m), weight 195 lb (88.5 kg), SpO2 98 %.  General appearance: alert, cooperative and no distress Neck: no carotid bruit and no JVD Lungs: clear to auscultation bilaterally Heart: regular rate and rhythm, S1, S2 normal, no murmur, click, rub or gallop Extremities: extremities normal, atraumatic, no cyanosis or edema Pulses: 2+ and symmetric Skin: Skin color, texture, turgor normal. No rashes or lesions Neurologic: Grossly normal  EKG not performed -- personally reviewed   ASSESSMENT AND PLAN:   1. CAD: s/p cath with mid LCx and  distal LAD with residual disease of 65% in the Diag. Negative NST 09/2016. Stable w/o recurrent angina. Continue medical therapy w/ ASA, Plavix, Imdur, amlodipine and Crestor   2. GERD: stable on Protonix 40 mg nightly.   3. HLD:  LDL 09/2016 was elevated at 136 mgDL. She has since been started on statin therapy with Crestor. Repeat FLP and HFTs collected this morning. Results pending.   4. HTN: controlled on current regimen.   Follow-Up w/ Dr. Mayford Knife in 6 months.   Trason Shifflet Delmer Islam, MHS [facility] 01/05/2017 2:23 PM

## 2017-01-12 ENCOUNTER — Telehealth: Payer: Self-pay

## 2017-01-12 DIAGNOSIS — E785 Hyperlipidemia, unspecified: Secondary | ICD-10-CM

## 2017-01-12 NOTE — Telephone Encounter (Signed)
Informed patient of results and verbal understanding expressed.  Patient states she had not been taking anything for her cholesterol.  She states when she went to the pharmacy to pick up her medications a couple months ago, Crestor was not one of them so she didn't take it. When she went last week for refills, the medication was included. She has been taking Crestor for 2 days. Reiterated to patient to take Crestor as instructed and to call the office if her medications are not correct when she goes to the pharmacy. FLP and ALT scheduled 7/9. Patient agrees with treatment plan.

## 2017-01-12 NOTE — Addendum Note (Signed)
Addended by: Gunnar FusiKEMP, Markeeta Scalf A on: 01/12/2017 09:17 AM   Modules accepted: Orders

## 2017-01-12 NOTE — Telephone Encounter (Signed)
-----   Message from Levin BaconKelley M Auten, [facility] sent at 01/07/2017  2:08 PM EDT ----- Pt LDL was at goal previously and it appears she was on Lipitor 80mg  at the time. Would make sure she has not missed doses since Crestor tends to be more potent than Lipitor. If no missed doses then could consider change back to Lipitor 80mg  daily. If not willing to change back [facility].

## 2017-02-04 ENCOUNTER — Telehealth: Payer: Self-pay | Admitting: Cardiology

## 2017-02-04 NOTE — Telephone Encounter (Signed)
Tonya Bryant is calling because she has questions about her medication and is wanting to speak with a nurse

## 2017-02-04 NOTE — Telephone Encounter (Signed)
Patient calling back and states that her rosuvastatin is $233 for a 30 day supply. Patient states that the pharmacy told her that they would need her insurance information when she goes to pick up the medications. I advised the patient to give her insurance information to the pharmacy and see how much her rosuvastatin would cost after that. Patient verbalized understanding and stated that she would call back if it was still too expensive.

## 2017-02-04 NOTE — Telephone Encounter (Signed)
Patient calling and states that she received a message from her pharmacy that the refills for rosuvastatin, imdur, and amlodipine were ready but the total was $300. I asked the patient if her pharmacy had her insurance information on file. She states that she has been having problems with her insurance. Patient wanting to know if we had samples in the office. I informed the patient that unfortunately we do not. Patient states that she is going to find out exactly which medication is costing so much and call us back.

## 2017-02-18 ENCOUNTER — Telehealth: Payer: Self-pay | Admitting: Cardiology

## 2017-02-18 NOTE — Telephone Encounter (Signed)
Patient calling in to let us know that she has been unable to afford her Amlodipine and Crestor secondary to insurance issues.  (reports that she missed a payment and insurance was cancelled.  She is currently under the appeals process for this mistake)  Patient tells me that she does not use a computer at home.  So I gave her an 800 number for goodrx to discuss how she can get discount for these meds while under the appeals process w/ her insucance company.   Explained that I found [facility]15[facility] and Crestor for about $15.4754mo at [facility]. Patient agreeable to calling number to see what help she can get.  Asked patient to call the office if she needed a goodrx card and we could leave one at the front desk. She thanks me for trying to help.

## 2017-02-18 NOTE — Telephone Encounter (Signed)
°  New Prob   Pt has questions regarding amLODipine (NORVASC) 10 MG tablet and rosuvastatin (CRESTOR) 40 MG tablet. Please call.

## 2017-03-02 ENCOUNTER — Other Ambulatory Visit: Payer: BLUE CROSS/BLUE SHIELD

## 2017-03-24 ENCOUNTER — Other Ambulatory Visit: Payer: BLUE CROSS/BLUE SHIELD

## 2017-03-30 ENCOUNTER — Telehealth: Payer: Self-pay | Admitting: Cardiology

## 2017-03-30 NOTE — Telephone Encounter (Signed)
Patient calling, states that she got hurt at work and will have to have MRI. Patient is scheduled for the MRI this afternoon at 6:30 pm and patient needs to know exactly where the stents are located. Please call

## 2017-03-30 NOTE — Telephone Encounter (Signed)
Patient walked in the office to retrieve information. Gave copy of cath report to patient per her request. She was grateful for assistance.

## 2017-05-20 ENCOUNTER — Ambulatory Visit: Payer: BLUE CROSS/BLUE SHIELD | Admitting: Cardiology

## 2017-05-20 ENCOUNTER — Other Ambulatory Visit: Payer: BLUE CROSS/BLUE SHIELD

## 2017-06-03 DIAGNOSIS — M25511 Pain in right shoulder: Secondary | ICD-10-CM | POA: Diagnosis not present

## 2017-06-08 DIAGNOSIS — M25511 Pain in right shoulder: Secondary | ICD-10-CM | POA: Diagnosis not present

## 2017-06-23 DIAGNOSIS — M25511 Pain in right shoulder: Secondary | ICD-10-CM | POA: Diagnosis not present

## 2017-07-13 ENCOUNTER — Encounter: Payer: Self-pay | Admitting: Cardiology

## 2017-07-13 ENCOUNTER — Encounter (INDEPENDENT_AMBULATORY_CARE_PROVIDER_SITE_OTHER): Payer: Self-pay

## 2017-07-13 ENCOUNTER — Other Ambulatory Visit: Payer: Medicare HMO

## 2017-07-13 ENCOUNTER — Ambulatory Visit (INDEPENDENT_AMBULATORY_CARE_PROVIDER_SITE_OTHER): Payer: Medicare HMO | Admitting: Cardiology

## 2017-07-13 VITALS — BP 130/72 | HR 74 | Ht 60.0 in | Wt 209.2 lb

## 2017-07-13 DIAGNOSIS — R600 Localized edema: Secondary | ICD-10-CM | POA: Diagnosis not present

## 2017-07-13 DIAGNOSIS — E785 Hyperlipidemia, unspecified: Secondary | ICD-10-CM | POA: Diagnosis not present

## 2017-07-13 DIAGNOSIS — I10 Essential (primary) hypertension: Secondary | ICD-10-CM

## 2017-07-13 DIAGNOSIS — I2510 Atherosclerotic heart disease of native coronary artery without angina pectoris: Secondary | ICD-10-CM

## 2017-07-13 NOTE — Progress Notes (Signed)
Cardiology Office Note:    Date:  07/13/2017   ID:  Tonya Bryant, DOB 03/14/1951, MRN [id number]  PCP:  Blair Heys, MD  Cardiologist:  Armanda Magic, MD   Referring MD: Blair Heys, MD   Chief Complaint  Patient presents with  . Follow-up    CAD, HTN, lipids    History of Present Illness:    Tonya Bryant is a 66 y.o. female with a hx of CAD s/p  drug-eluting stents in the mid circumflex and distal LAD on 11/01/15. Cath showed residual 65% first diagonal stenosis as well.  She is on long acting nitrates for chronic angina.  Nuclear stress test showed no ischemia 09/2016 that was done for CP.  She has GERD and her Protonix was increased to BID but had increased fatigue and went back to daily.  She is here today for followup and is doing well.  She denies any anginal chest pain or pressure, SOB, DOE (except with very fast walking and going up stairs), PND, orthopnea, dizziness, palpitations or syncope. She notices occasional LE edema usually after standing for long periods of time.  She is compliant with her meds and is tolerating meds with no SE.    Past Medical History:  Diagnosis Date  . Anxiety   . CAD (coronary artery disease), native coronary artery 11/02/2015   Ost RCA lesion, 30% stenosed, Prox LAD lesion, 50% stenosed, 1st Diag lesion, 65% stenosed, Ramus lesion, 50% stenosed, Mid Cx lesion, 80% stenosed, Dist LAD lesion, 75% stenosed. S/P DES to distal LAD and mid Cx 10/2015  . Depression (emotion)   . Dyslipidemia, goal LDL below 70 02/07/2016  . Full dentures   . Hypertension     Past Surgical History:  Procedure Laterality Date  . BREAST EXCISIONAL BIOPSY Left   . BREAST SURGERY     lt br bx-neg  . COLONOSCOPY    . LEFT CARPAL TUNNEL RELEASE Left 07/07/2013   Performed by Wyn Forster, MD at [facility]  . Left Heart Cath and Coronary Angiography N/A 11/01/2015   Performed by Tonny Bollman, MD at [facility] INVASIVE CV LAB  . LEFT THUMB RELEASE  A-1 PULLEY Left 07/07/2013   Performed by Wyn Forster, MD at [facility]  . TOE OSTEOTOMY     rt big toe  . TOOTH EXTRACTION    . TUBAL LIGATION      Current Medications: Current Meds  Medication Sig  . amLODipine (NORVASC) 10 MG tablet Take 1 tablet (10 mg total) by mouth daily. (Patient taking differently: Take 10 mg by mouth daily. Blood pressure)  . aspirin 81 MG tablet Take 1 tablet (81 mg total) by mouth daily.  . citalopram (CELEXA) 40 MG tablet Take 0.5 tablets (20 mg total) by mouth daily. (Patient taking differently: Take 20 mg by mouth daily. Anxiety and depression)  . clopidogrel (PLAVIX) 75 MG tablet Take 1 tablet (75 mg total) by mouth daily. (Patient taking differently: Take 75 mg by mouth daily. Blood Thinner)  . isosorbide mononitrate (IMDUR) 60 MG 24 hr tablet Take 1 tablet (60 mg total) by mouth daily. (Patient taking differently: Take 60 mg by mouth daily. Chest pressure)  . lisinopril (PRINIVIL,ZESTRIL) 20 MG tablet Take 1 tablet (20 mg total) by mouth daily. (Patient taking differently: Take 20 mg by mouth daily. Blood pressure)  . nitroGLYCERIN (NITROSTAT) 0.4 MG SL tablet Place 1 tablet (0.4 mg total) under the tongue every 5 (  five) minutes as needed for chest pain (for 3 doses only).  . pantoprazole (PROTONIX) 40 MG tablet Take 40 mg by mouth daily. Heart burn  . rosuvastatin (CRESTOR) 40 MG tablet Take 1 tablet (40 mg total) by mouth daily. (Patient taking differently: Take 40 mg by mouth daily. Cholesterol)     Allergies:   Patient has no known allergies.   Social History   Socioeconomic History  . Marital status: Single    Spouse name: None  . Number of children: None  . Years of education: None  . Highest education level: None  Social Needs  . Financial resource strain: None  . Food insecurity - worry: None  . Food insecurity - inability: None  . Transportation needs - medical: None  . Transportation needs - non-medical: None    Occupational History  . None  Tobacco Use  . Smoking status: Never Smoker  . Smokeless tobacco: Never Used  Substance and Sexual Activity  . Alcohol use: Yes    Alcohol/week: 0.5 oz    Types: 1 Standard drinks or equivalent per week  . Drug use: No  . Sexual activity: None  Other Topics Concern  . None  Social History Narrative  . None     Family History: The patient's family history includes Cancer in her mother; Diabetes in her father and mother; Gout in her father; Hypertension in her father and mother; Stroke in her father and mother.  ROS:   Please see the history of present illness.    Review of Systems  Musculoskeletal: Positive for back pain and muscle cramps.    All other systems reviewed and negative.   EKGs/Labs/Other Studies Reviewed:    The following studies were reviewed today: none  EKG:  EKG is not ordered today.    Recent Labs: 01/05/2017: ALT 19; BUN 13; Creatinine, Ser 0.87; Potassium 4.5; Sodium 139   Recent Lipid Panel    Component Value Date/Time   CHOL 199 01/05/2017 0843   TRIG 77 01/05/2017 0843   HDL 64 01/05/2017 0843   CHOLHDL 3.1 01/05/2017 0843   CHOLHDL 2.2 01/07/2016 0930   VLDL 10 01/07/2016 0930   LDLCALC 120 (H) 01/05/2017 0843    Physical Exam:    VS:  BP 130/72   Pulse 74   Ht 5' (1.524 m)   Wt 209 lb 3.2 oz (94.9 kg)   SpO2 96%   BMI 40.86 kg/m     Wt Readings from Last 3 Encounters:  07/13/17 209 lb 3.2 oz (94.9 kg)  01/05/17 195 lb (88.5 kg)  11/13/16 200 lb 12.8 oz (91.1 kg)     GEN:  Well nourished, well developed in no acute distress HEENT: Normal NECK: No JVD; No carotid bruits LYMPHATICS: No lymphadenopathy CARDIAC: RRR, no murmurs, rubs, gallops RESPIRATORY:  Clear to auscultation without rales, wheezing or rhonchi  ABDOMEN: Soft, non-tender, non-distended MUSCULOSKELETAL:  1+ LE edema; No deformity  SKIN: Warm and dry NEUROLOGIC:  Alert and oriented x 3 PSYCHIATRIC:  Normal affect    ASSESSMENT:    1. Coronary artery disease involving native coronary artery of native heart without angina pectoris   2. Essential hypertension   3. Dyslipidemia, goal LDL below 70   4. Edema extremities    PLAN:    In order of problems listed above:  1. ASCAD - s/p  drug-eluting stents in the mid circumflex and distal LAD  With 65% residual D1 on cath 11/01/15. She has chronic stable angina with  no ischemia on nuclear stress test 08/2016.  She denies any anginal symptoms.  She will continue on ASA, Plavix, statin and long acting nitrate.  2.  HTN - BP is well controlled on exam today.  She wil continue on Linsinopril 20mg  daily and amlodipne 10mg  daily.    3.  Hyperlipidemia with LDL goal < 70.  She will continue on crestor 40mg  daily  Her last LDL was 120 on 01/05/2017.  I will repeat FLP and ALT.    4.  LE edema - likely related to standing for long periods of time.  I have recommended that she try compression hose.  I have given her a Rx today.     Medication Adjustments/Labs and Tests Ordered: Current medicines are reviewed at length with the patient today.  Concerns regarding medicines are outlined above.  No orders of the defined types were placed in this encounter.  No orders of the defined types were placed in this encounter.   Signed, Armanda Magicraci Erick Murin, MD  07/13/2017 10:02 AM    [facility]

## 2017-07-13 NOTE — Patient Instructions (Signed)
Medication Instructions:  Your physician recommends that you continue on your current medications as directed. Please refer to the Current Medication list given to you today.  Labwork: Your physician recommends that you return for lab work in: 1 week for liver function test and fasting lipids.   Testing/Procedures: None ordered   Follow-Up: Your physician wants you to follow-up in: 1 year with Dr. Mayford Knifeurner. You will receive a reminder letter in the mail two months in advance. If you don't receive a letter, please call our office to schedule the follow-up appointment.   Any Other Special Instructions Will Be Listed Below (If Applicable).  Your physician recommends that you wear compression hose to help with swelling in lower extremities and keep legs elevated to decrease swelling.      If you need a refill on your cardiac medications before your next appointment, please call your pharmacy.

## 2017-07-20 ENCOUNTER — Other Ambulatory Visit: Payer: Medicare HMO | Admitting: *Deleted

## 2017-07-20 DIAGNOSIS — E785 Hyperlipidemia, unspecified: Secondary | ICD-10-CM

## 2017-07-20 LAB — HEPATIC FUNCTION PANEL
ALBUMIN: 4.1 g/dL (ref 3.6–4.8)
ALK PHOS: 62 IU/L (ref 39–117)
ALT: 16 IU/L (ref 0–32)
AST: 14 IU/L (ref 0–40)
BILIRUBIN, DIRECT: 0.15 mg/dL (ref 0.00–0.40)
Bilirubin Total: 0.4 mg/dL (ref 0.0–1.2)
TOTAL PROTEIN: 6.5 g/dL (ref 6.0–8.5)

## 2017-07-20 LAB — LIPID PANEL
CHOL/HDL RATIO: 1.9 ratio (ref 0.0–4.4)
Cholesterol, Total: 129 mg/dL (ref 100–199)
HDL: 68 mg/dL (ref >39)
LDL CALC: 51 mg/dL (ref 0–99)
TRIGLYCERIDES: 49 mg/dL (ref 0–149)
VLDL Cholesterol Cal: 10 mg/dL (ref 5–40)

## 2017-08-21 ENCOUNTER — Other Ambulatory Visit: Payer: Self-pay | Admitting: Cardiology

## 2017-08-21 MED ORDER — ROSUVASTATIN CALCIUM 40 MG PO TABS: 40.0000 mg | ORAL_TABLET | Freq: Every day | ORAL | 3 refills | Status: DC

## 2017-10-08 DIAGNOSIS — B349 Viral infection, unspecified: Secondary | ICD-10-CM | POA: Diagnosis not present

## 2017-10-08 DIAGNOSIS — Z6841 Body Mass Index (BMI) 40.0 and over, adult: Secondary | ICD-10-CM | POA: Diagnosis not present

## 2017-11-08 ENCOUNTER — Other Ambulatory Visit: Payer: Self-pay | Admitting: Cardiology

## 2017-11-09 ENCOUNTER — Other Ambulatory Visit: Payer: Self-pay | Admitting: Cardiology

## 2017-11-09 MED ORDER — NITROGLYCERIN 0.4 MG SL SUBL: 0.4000 mg | SUBLINGUAL_TABLET | SUBLINGUAL | 5 refills | Status: DC | PRN

## 2017-11-09 NOTE — Telephone Encounter (Signed)
Pt's medication was sent to pt's pharmacy as requested. Confirmation received.  °

## 2017-11-26 IMAGING — DX DG CHEST 2V
2 series · 2 of 2 positions shown · non-contrast
Comparison: 10/31/2015

CLINICAL DATA: Chest pain that began last night along with
shortness of breath.

EXAM:
CHEST  2 VIEW

[chest pa]
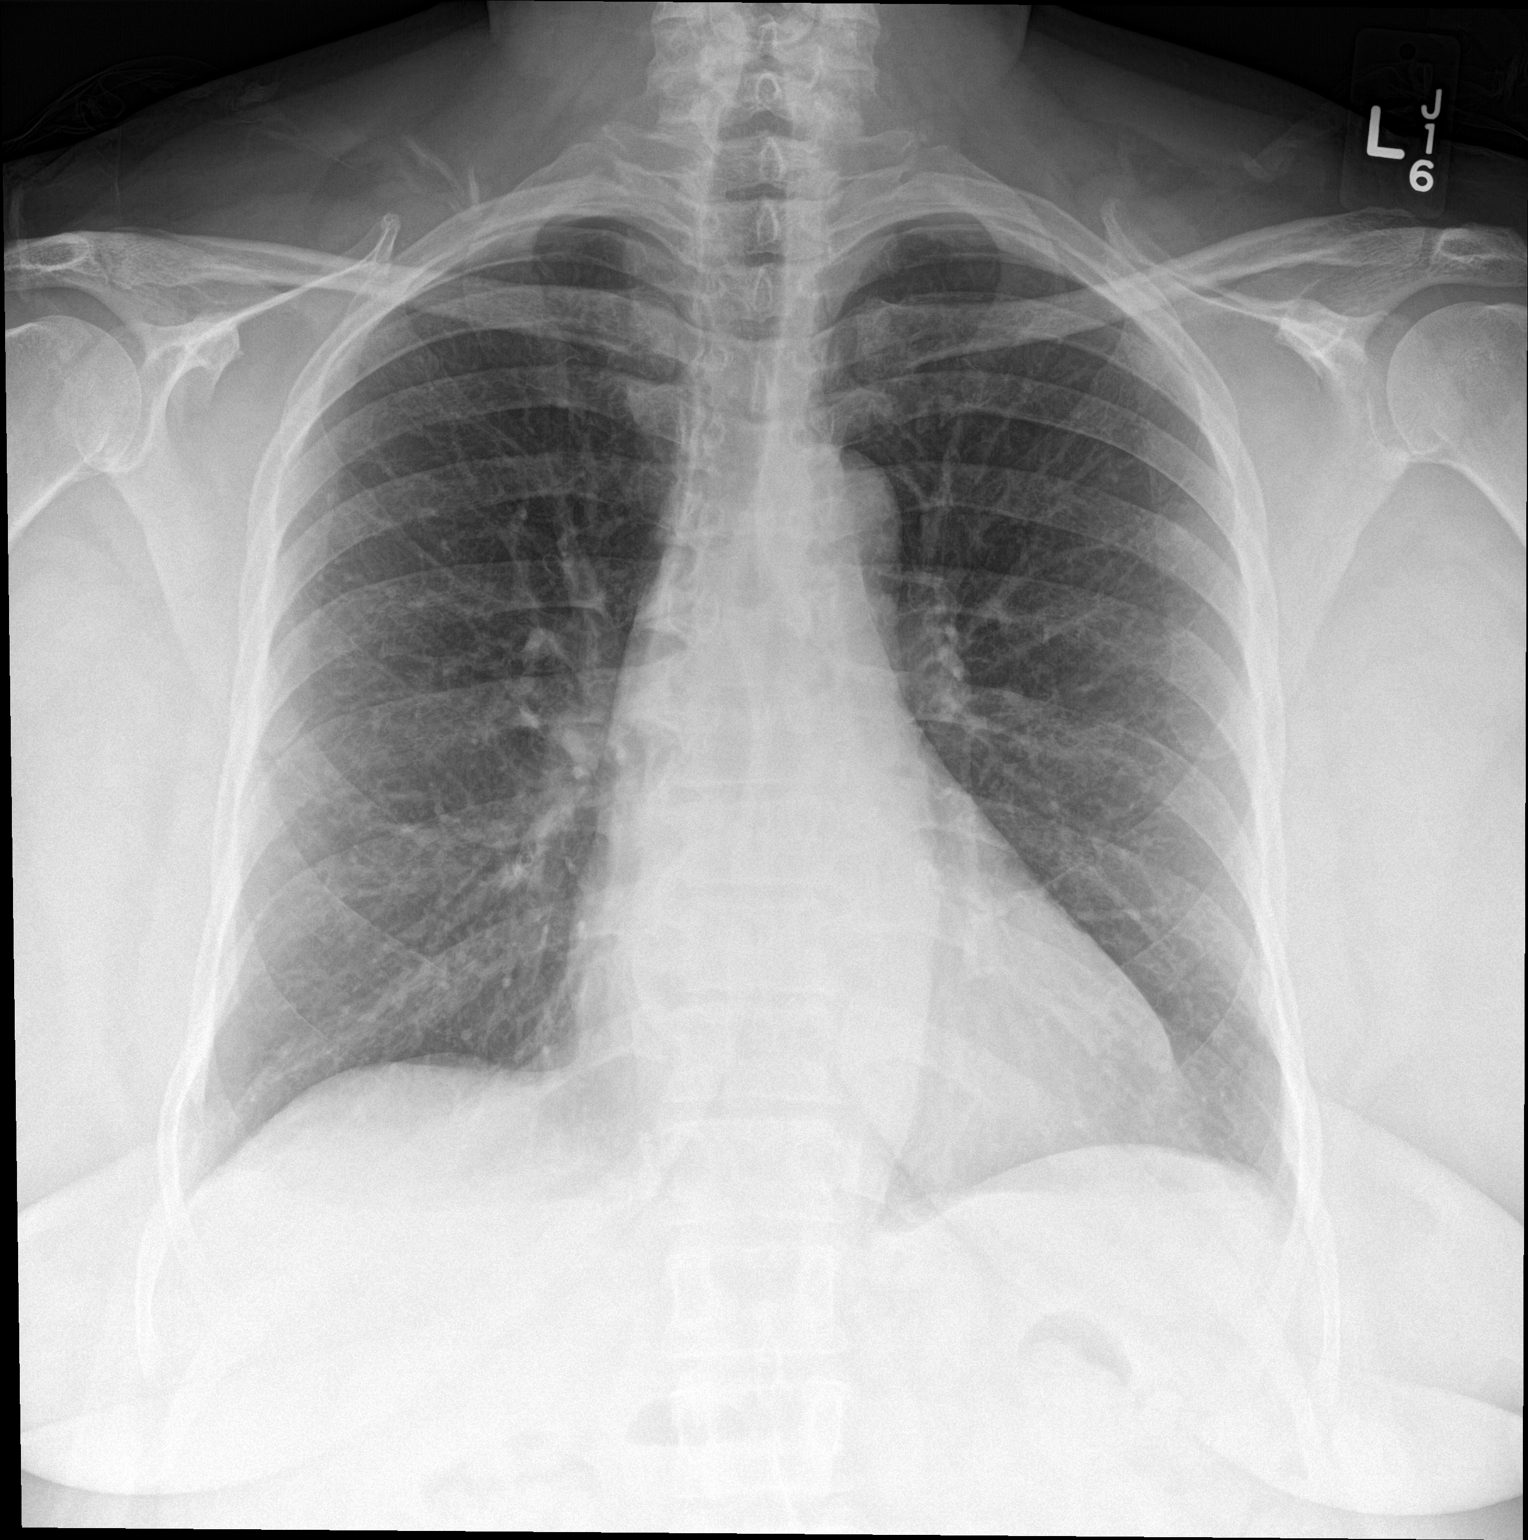

[chest lat]
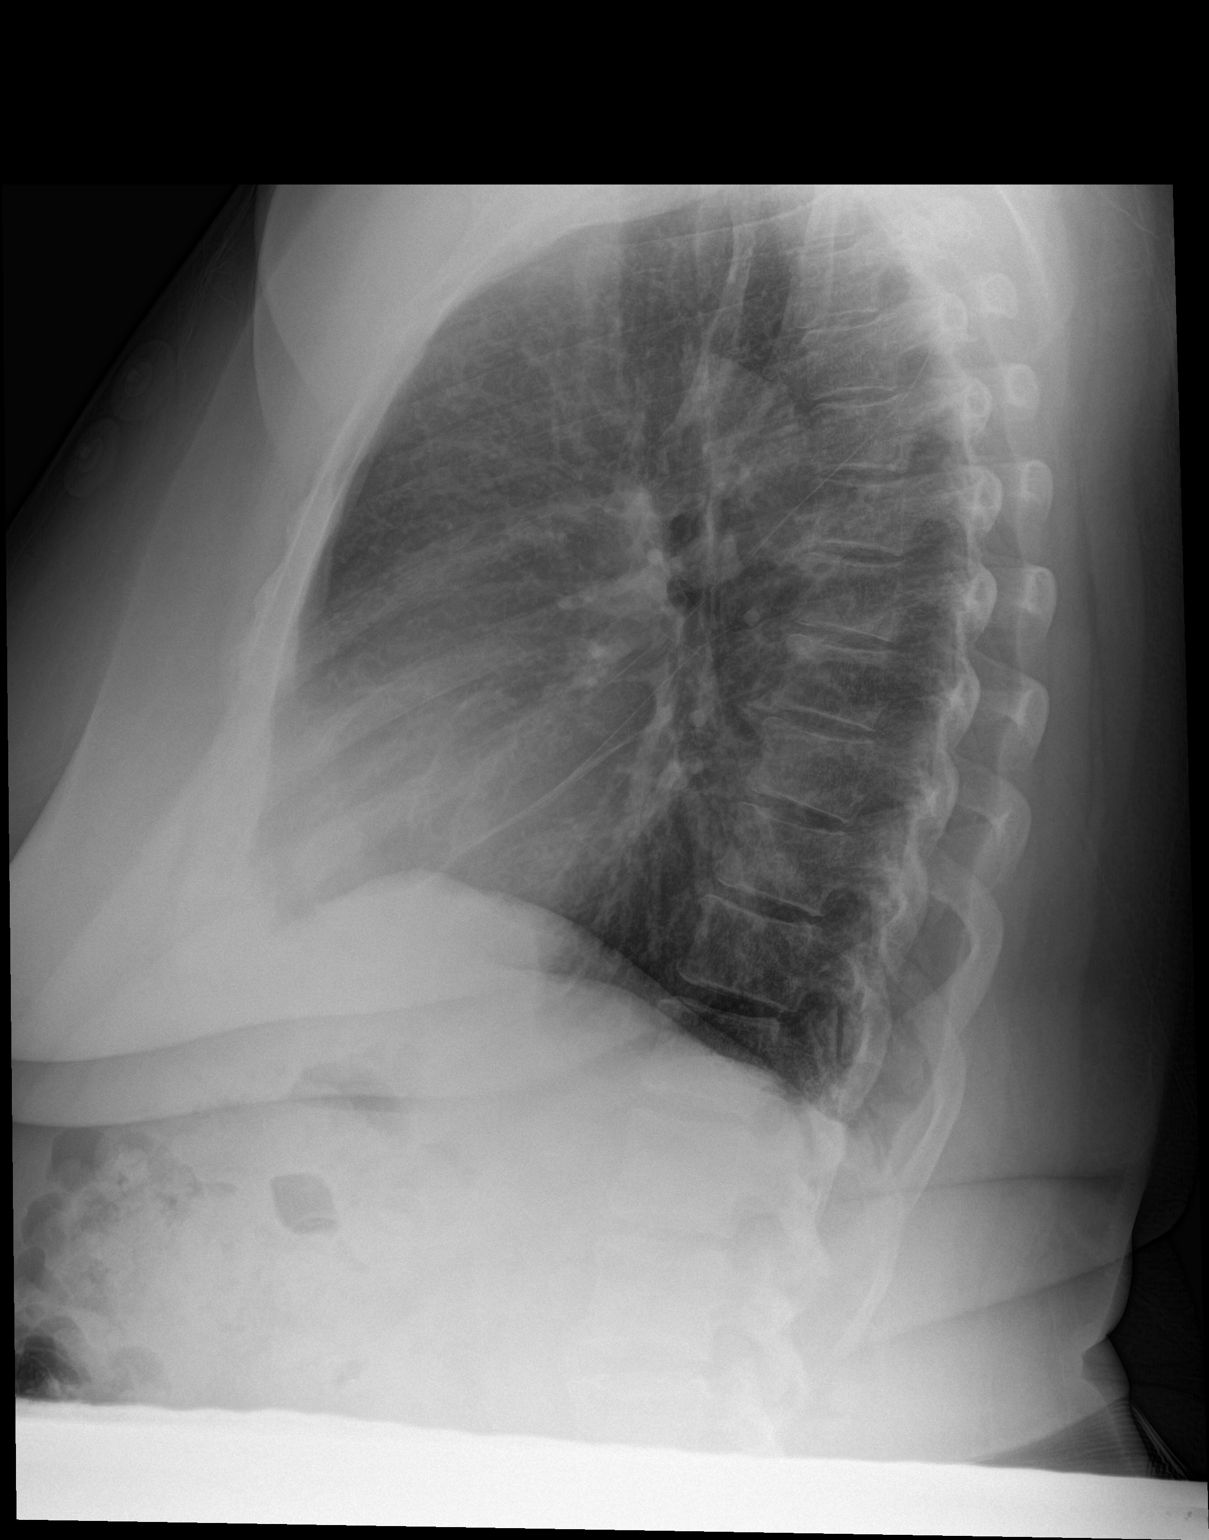

[2 of 2 positions shown; findings below may reference images not displayed]

FINDINGS: The heart size and mediastinal contours are within normal limits.
Both lungs are clear. The visualized skeletal structures are
unremarkable.
IMPRESSION: No active cardiopulmonary disease.

## 2017-12-08 DIAGNOSIS — I10 Essential (primary) hypertension: Secondary | ICD-10-CM | POA: Diagnosis not present

## 2017-12-08 DIAGNOSIS — Z6839 Body mass index (BMI) 39.0-39.9, adult: Secondary | ICD-10-CM | POA: Diagnosis not present

## 2017-12-08 DIAGNOSIS — Z1239 Encounter for other screening for malignant neoplasm of breast: Secondary | ICD-10-CM | POA: Diagnosis not present

## 2017-12-08 DIAGNOSIS — Z1211 Encounter for screening for malignant neoplasm of colon: Secondary | ICD-10-CM | POA: Diagnosis not present

## 2017-12-08 DIAGNOSIS — Z0000 Encounter for general adult medical examination without abnormal findings: Secondary | ICD-10-CM | POA: Diagnosis not present

## 2017-12-08 DIAGNOSIS — I2510 Atherosclerotic heart disease of native coronary artery without angina pectoris: Secondary | ICD-10-CM | POA: Diagnosis not present

## 2017-12-08 DIAGNOSIS — M25511 Pain in right shoulder: Secondary | ICD-10-CM | POA: Diagnosis not present

## 2017-12-08 DIAGNOSIS — R69 Illness, unspecified: Secondary | ICD-10-CM | POA: Diagnosis not present

## 2017-12-11 ENCOUNTER — Observation Stay (HOSPITAL_COMMUNITY)
Admission: EM | Admit: 2017-12-11 | Discharge: 2017-12-13 | Disposition: A | Discharge status: Home / Self Care | Payer: Medicare HMO | Attending: Family Medicine | Admitting: Family Medicine

## 2017-12-11 ENCOUNTER — Other Ambulatory Visit: Payer: Self-pay

## 2017-12-11 ENCOUNTER — Encounter (HOSPITAL_COMMUNITY): Payer: Self-pay | Admitting: Emergency Medicine

## 2017-12-11 ENCOUNTER — Emergency Department (HOSPITAL_COMMUNITY): Payer: Medicare HMO

## 2017-12-11 DIAGNOSIS — E785 Hyperlipidemia, unspecified: Secondary | ICD-10-CM | POA: Diagnosis not present

## 2017-12-11 DIAGNOSIS — F32A Depression, unspecified: Secondary | ICD-10-CM | POA: Diagnosis present

## 2017-12-11 DIAGNOSIS — Z8249 Family history of ischemic heart disease and other diseases of the circulatory system: Secondary | ICD-10-CM | POA: Diagnosis not present

## 2017-12-11 DIAGNOSIS — M7989 Other specified soft tissue disorders: Secondary | ICD-10-CM | POA: Diagnosis not present

## 2017-12-11 DIAGNOSIS — Z7982 Long term (current) use of aspirin: Secondary | ICD-10-CM | POA: Insufficient documentation

## 2017-12-11 DIAGNOSIS — Z955 Presence of coronary angioplasty implant and graft: Secondary | ICD-10-CM | POA: Diagnosis not present

## 2017-12-11 DIAGNOSIS — I2510 Atherosclerotic heart disease of native coronary artery without angina pectoris: Secondary | ICD-10-CM | POA: Diagnosis not present

## 2017-12-11 DIAGNOSIS — Z7902 Long term (current) use of antithrombotics/antiplatelets: Secondary | ICD-10-CM | POA: Insufficient documentation

## 2017-12-11 DIAGNOSIS — I10 Essential (primary) hypertension: Secondary | ICD-10-CM | POA: Diagnosis not present

## 2017-12-11 DIAGNOSIS — Z6839 Body mass index (BMI) 39.0-39.9, adult: Secondary | ICD-10-CM | POA: Insufficient documentation

## 2017-12-11 DIAGNOSIS — K219 Gastro-esophageal reflux disease without esophagitis: Secondary | ICD-10-CM | POA: Diagnosis not present

## 2017-12-11 DIAGNOSIS — F329 Major depressive disorder, single episode, unspecified: Secondary | ICD-10-CM | POA: Insufficient documentation

## 2017-12-11 DIAGNOSIS — R072 Precordial pain: Principal | ICD-10-CM | POA: Insufficient documentation

## 2017-12-11 DIAGNOSIS — R69 Illness, unspecified: Secondary | ICD-10-CM | POA: Diagnosis not present

## 2017-12-11 DIAGNOSIS — R079 Chest pain, unspecified: Secondary | ICD-10-CM | POA: Diagnosis not present

## 2017-12-11 DIAGNOSIS — E669 Obesity, unspecified: Secondary | ICD-10-CM | POA: Diagnosis not present

## 2017-12-11 DIAGNOSIS — F419 Anxiety disorder, unspecified: Secondary | ICD-10-CM | POA: Insufficient documentation

## 2017-12-11 DIAGNOSIS — I25119 Atherosclerotic heart disease of native coronary artery with unspecified angina pectoris: Secondary | ICD-10-CM | POA: Diagnosis present

## 2017-12-11 DIAGNOSIS — R0602 Shortness of breath: Secondary | ICD-10-CM | POA: Diagnosis not present

## 2017-12-11 DIAGNOSIS — R0789 Other chest pain: Secondary | ICD-10-CM | POA: Diagnosis not present

## 2017-12-11 LAB — CBC
HEMATOCRIT: 38.1 % (ref 36.0–46.0)
HEMOGLOBIN: 12.6 g/dL (ref 12.0–15.0)
MCH: 31 pg (ref 26.0–34.0)
MCHC: 33.1 g/dL (ref 30.0–36.0)
MCV: 93.8 fL (ref 78.0–100.0)
Platelets: 271 10*3/uL (ref 150–400)
RBC: 4.06 MIL/uL (ref 3.87–5.11)
RDW: 13 % (ref 11.5–15.5)
WBC: 7.7 10*3/uL (ref 4.0–10.5)

## 2017-12-11 LAB — RAPID URINE DRUG SCREEN, HOSP PERFORMED
Amphetamines: NOT DETECTED
BARBITURATES: NOT DETECTED
Benzodiazepines: NOT DETECTED
Cocaine: NOT DETECTED
Opiates: NOT DETECTED
TETRAHYDROCANNABINOL: NOT DETECTED

## 2017-12-11 LAB — BASIC METABOLIC PANEL
ANION GAP: 11 (ref 5–15)
BUN: 14 mg/dL (ref 6–20)
CALCIUM: 9.2 mg/dL (ref 8.9–10.3)
CO2: 19 mmol/L — AB (ref 22–32)
Chloride: 107 mmol/L (ref 101–111)
Creatinine, Ser: 0.74 mg/dL (ref 0.44–1.00)
GFR calc Af Amer: >60 mL/min (ref >60)
GFR calc non Af Amer: >60 mL/min (ref >60)
Glucose, Bld: 92 mg/dL (ref 65–99)
POTASSIUM: 3.7 mmol/L (ref 3.5–5.1)
Sodium: 137 mmol/L (ref 135–145)

## 2017-12-11 LAB — I-STAT TROPONIN, ED
TROPONIN I, POC: 0 ng/mL (ref 0.00–0.08)
TROPONIN I, POC: 0 ng/mL (ref 0.00–0.08)

## 2017-12-11 LAB — TROPONIN I: Troponin I: <0.03 ng/mL (ref <0.03)

## 2017-12-11 MED ORDER — PANTOPRAZOLE SODIUM 40 MG PO TBEC
40.0000 mg | DELAYED_RELEASE_TABLET | Freq: Every day | ORAL | Status: DC
  Administered 2017-12-12 – 2017-12-13 (×2): 40 mg via ORAL
  Filled 2017-12-11: qty 2
  Filled 2017-12-11: qty 1

## 2017-12-11 MED ORDER — LISINOPRIL 20 MG PO TABS
20.0000 mg | ORAL_TABLET | Freq: Every day | ORAL | Status: DC
  Administered 2017-12-12 – 2017-12-13 (×2): 20 mg via ORAL
  Filled 2017-12-11 (×2): qty 1

## 2017-12-11 MED ORDER — HYDRALAZINE HCL 20 MG/ML IJ SOLN
5.0000 mg | INTRAMUSCULAR | Status: DC | PRN
Start: 2017-12-11 — End: 2017-12-13

## 2017-12-11 MED ORDER — MORPHINE SULFATE (PF) 4 MG/ML IV SOLN: 2.0000 mg | INTRAVENOUS | Status: DC | PRN

## 2017-12-11 MED ORDER — ONDANSETRON HCL 4 MG/2ML IJ SOLN: 4.0000 mg | Freq: Four times a day (QID) | INTRAMUSCULAR | Status: DC | PRN

## 2017-12-11 MED ORDER — AMLODIPINE BESYLATE 10 MG PO TABS
10.0000 mg | ORAL_TABLET | Freq: Every day | ORAL | Status: DC
  Administered 2017-12-12 – 2017-12-13 (×2): 10 mg via ORAL
  Filled 2017-12-11 (×2): qty 1

## 2017-12-11 MED ORDER — CITALOPRAM HYDROBROMIDE 20 MG PO TABS
20.0000 mg | ORAL_TABLET | Freq: Every day | ORAL | Status: DC
  Administered 2017-12-12 – 2017-12-13 (×2): 20 mg via ORAL
  Filled 2017-12-11 (×2): qty 1

## 2017-12-11 MED ORDER — ASPIRIN EC 81 MG PO TBEC
81.0000 mg | DELAYED_RELEASE_TABLET | Freq: Every day | ORAL | Status: DC
  Administered 2017-12-12 – 2017-12-13 (×2): 81 mg via ORAL
  Filled 2017-12-11 (×2): qty 1

## 2017-12-11 MED ORDER — NITROGLYCERIN 0.4 MG SL SUBL: 0.4000 mg | SUBLINGUAL_TABLET | SUBLINGUAL | Status: DC | PRN

## 2017-12-11 MED ORDER — ZOLPIDEM TARTRATE 5 MG PO TABS
5.0000 mg | ORAL_TABLET | Freq: Every evening | ORAL | Status: DC | PRN
  Filled 2017-12-11: qty 1

## 2017-12-11 MED ORDER — ROSUVASTATIN CALCIUM 40 MG PO TABS
40.0000 mg | ORAL_TABLET | Freq: Every day | ORAL | Status: DC
  Administered 2017-12-11 – 2017-12-12 (×2): 40 mg via ORAL
  Filled 2017-12-11 (×2): qty 1

## 2017-12-11 MED ORDER — ACETAMINOPHEN 325 MG PO TABS: 650.0000 mg | ORAL_TABLET | ORAL | Status: DC | PRN

## 2017-12-11 MED ORDER — ENOXAPARIN SODIUM 40 MG/0.4ML ~~LOC~~ SOLN
40.0000 mg | SUBCUTANEOUS | Status: DC
  Administered 2017-12-11 – 2017-12-12 (×2): 40 mg via SUBCUTANEOUS
  Filled 2017-12-11 (×2): qty 0.4

## 2017-12-11 MED ORDER — ISOSORBIDE MONONITRATE ER 60 MG PO TB24
60.0000 mg | ORAL_TABLET | Freq: Every day | ORAL | Status: DC
  Administered 2017-12-12 – 2017-12-13 (×2): 60 mg via ORAL
  Filled 2017-12-11 (×2): qty 1

## 2017-12-11 MED ORDER — CLOPIDOGREL BISULFATE 75 MG PO TABS
75.0000 mg | ORAL_TABLET | Freq: Every day | ORAL | Status: DC
  Administered 2017-12-12 – 2017-12-13 (×2): 75 mg via ORAL
  Filled 2017-12-11 (×2): qty 1

## 2017-12-11 NOTE — H&P (Signed)
History and Physical    Tonya Bryant ZOX:[id number] DOB: 10/21/1950 DOA: 12/11/2017  Referring MD/NP/PA:   PCP: Blair HeysEhinger, Robert, MD   Patient coming from:  The patient is coming from home.  At baseline, pt is independent for most of ADL.  Chief Complaint: chest pain  HPI: Tonya Bryant is a 67 y.o. female with medical history significant of or hypertension, hyperlipidemia, CAD, s/p of DES stent placement, obesity, GERD, depression, who presents with chest pain.   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 The pain is located in the substernal area, dull, 6 out of 10 in severity, radiating to the left arm. She took nitroglycerin and aspirin 325 mg. Her chest pain has gradually resolved. Currently she is chest pain-free. Patient states that she has SOB sometimes on exertion. No cough. Denies tenderness in the calf areas. Patient has nausea, no vomiting, diarrhea or abdominal pain. Denies symptoms of UTI or unilateral weakness. Patient states that by the end of each day, she has mild ankle swelling.  ED Course: pt was found to have negative troponin 2, WBC 7.7, creatinine normal, temperature normal, no tachycardia, no tachypnea, oxygen saturation 98% on room air, negative chest x-ray. Patient is placed on telemetry bed for observation.  Review of Systems:   General: no fevers, chills, no body weight gain, has fatigue HEENT: no blurry vision, hearing changes or sore throat Respiratory: no dyspnea, coughing, wheezing CV: has chest pain, no palpitations GI: no nausea, vomiting, abdominal pain, diarrhea, constipation GU: no dysuria, burning on urination, increased urinary frequency, hematuria  Ext:  has trace ankle edema bilaterally. Neuro: no unilateral weakness, numbness, or tingling, no vision change or hearing loss Skin: no rash, no skin tear. MSK: No muscle spasm, no deformity, no limitation of range of movement in  spin Heme: No easy bruising.  Travel history: No recent long distant travel.  Allergy: No Known Allergies  Past Medical History:  Diagnosis Date  . Anxiety   . CAD (coronary artery disease), native coronary artery 11/02/2015   Ost RCA lesion, 30% stenosed, Prox LAD lesion, 50% stenosed, 1st Diag lesion, 65% stenosed, Ramus lesion, 50% stenosed, Mid Cx lesion, 80% stenosed, Dist LAD lesion, 75% stenosed. S/P DES to distal LAD and mid Cx 10/2015  . Depression (emotion)   . Dyslipidemia, goal LDL below 70 02/07/2016  . Full dentures   . Hypertension     Past Surgical History:  Procedure Laterality Date  . BREAST EXCISIONAL BIOPSY Left   . BREAST SURGERY     lt br bx-neg  . CARDIAC CATHETERIZATION N/A 11/01/2015   Procedure: Left Heart Cath and Coronary Angiography;  Surgeon: Tonny BollmanMichael Cooper, MD;  [facility];  Service: Cardiovascular;  Laterality: N/A;  . CARPAL TUNNEL RELEASE Left 07/07/2013   Procedure: LEFT CARPAL TUNNEL RELEASE;  Surgeon: Wyn Forsterobert V Sypher Jr., MD;  Location: [facility];  Service: Orthopedics;  Laterality: Left;  . COLONOSCOPY    . TOE OSTEOTOMY     rt big toe  . TOOTH EXTRACTION    . TRIGGER FINGER RELEASE Left 07/07/2013   Procedure: LEFT THUMB RELEASE A-1 PULLEY;  Surgeon: Wyn Forsterobert V Sypher Jr., MD;  Location: [facility];  Service: Orthopedics;  Laterality: Left;  . TUBAL LIGATION      Social History:  reports that she has never smoked. She has never used smokeless tobacco. She reports that she  drinks about 0.5 oz of alcohol per week. She reports that she does not use drugs.  Family History:  Family History  Problem Relation Age of Onset  . Cancer Mother   . Diabetes Mother   . Stroke Mother   . Hypertension Mother   . Stroke Father   . Diabetes Father   . Hypertension Father   . Gout Father      Prior to Admission medications   Medication Sig Start Date End Date Taking? Authorizing Provider  amLODipine  (NORVASC) 10 MG tablet Take 1 tablet (10 mg total) by mouth daily. Patient taking differently: Take 10 mg by mouth daily. Blood pressure 11/02/15  Yes Ghimire, Werner Lean, MD  aspirin 81 MG tablet Take 1 tablet (81 mg total) by mouth daily. 11/02/15  Yes Ghimire, Werner Lean, MD  citalopram (CELEXA) 40 MG tablet Take 0.5 tablets (20 mg total) by mouth daily. Patient taking differently: Take 20 mg by mouth daily. Anxiety and depression 11/02/15  Yes Ghimire, Werner Lean, MD  clopidogrel (PLAVIX) 75 MG tablet TAKE 1 TABLET BY MOUTH ONCE DAILY 11/10/17  Yes Turner, Cornelious Bryant, MD  isosorbide mononitrate (IMDUR) 60 MG 24 hr tablet Take 1 tablet (60 mg total) by mouth daily. Patient taking differently: Take 60 mg by mouth daily. Chest pressure 10/08/16  Yes Turner, Traci R, MD  lisinopril (PRINIVIL,ZESTRIL) 20 MG tablet Take 1 tablet (20 mg total) by mouth daily. Patient taking differently: Take 20 mg by mouth daily. Blood pressure 11/02/15  Yes Ghimire, Werner Lean, MD  nitroGLYCERIN (NITROSTAT) 0.4 MG SL tablet Place 1 tablet (0.4 mg total) under the tongue every 5 (five) minutes as needed for chest pain (for 3 doses only). 11/09/17  Yes Turner, Cornelious Bryant, MD  pantoprazole (PROTONIX) 40 MG tablet Take 40 mg by mouth daily. Heart burn   Yes [provider]  rosuvastatin (CRESTOR) 40 MG tablet Take 1 tablet (40 mg total) by mouth daily. Patient taking differently: Take 40 mg by mouth at bedtime.  08/21/17   Quintella Reichert, MD    Physical Exam: Vitals:   12/11/17 1347 12/11/17 1348 12/11/17 1539 12/11/17 1817  BP: (!) 127/100  (!) 156/89 (!) 171/78  Pulse: 75  62 60  Resp: 17  16 16   Temp: 97.8 F (36.6 C)     SpO2: 97%  99% 98%  Weight:  93 kg (205 lb)    Height:  5' (1.524 m)     General: Not in acute distress HEENT:       Eyes: PERRL, EOMI, no scleral icterus.       ENT: No discharge from the ears and nose, no pharynx injection, no tonsillar enlargement.        Neck: No JVD, no bruit, no mass  felt. Heme: No neck lymph node enlargement. Cardiac: S1/S2, RRR, No murmurs, No gallops or rubs. Respiratory: rales, wheezing, rhonchi or rubs. GI: Soft, nondistended, nontender, no rebound pain, no organomegaly, BS present. GU: No hematuria Ext: has trace ankle edema bilaterally. 2+DP/PT pulse bilaterally. Musculoskeletal: No joint deformities, No joint redness or warmth, no limitation of ROM in spin. Skin: No rashes.  Neuro: Alert, oriented X3, cranial nerves II-XII grossly intact, moves all extremities normally Psych: Patient is not psychotic, no suicidal or hemocidal ideation.  Labs on Admission: I have personally reviewed following labs and imaging studies  CBC: Recent Labs  Lab 12/11/17 1831  WBC 7.7  HGB 12.6  HCT 38.1  MCV 93.8  PLT  271   Basic Metabolic Panel: Recent Labs  Lab 12/11/17 1831  NA 137  K 3.7  CL 107  CO2 19*  GLUCOSE 92  BUN 14  CREATININE 0.74  CALCIUM 9.2   GFR: Estimated Creatinine Clearance: 71.4 mL/min (by C-G formula based on SCr of 0.74 mg/dL). Liver Function Tests: No results for input(s): AST, ALT, ALKPHOS, BILITOT, PROT, ALBUMIN in the last 168 hours. No results for input(s): LIPASE, AMYLASE in the last 168 hours. No results for input(s): AMMONIA in the last 168 hours. Coagulation Profile: No results for input(s): INR, PROTIME in the last 168 hours. Cardiac Enzymes: No results for input(s): CKTOTAL, CKMB, CKMBINDEX, TROPONINI in the last 168 hours. BNP (last 3 results) No results for input(s): PROBNP in the last 8760 hours. HbA1C: No results for input(s): HGBA1C in the last 72 hours. CBG: No results for input(s): GLUCAP in the last 168 hours. Lipid Profile: No results for input(s): CHOL, HDL, LDLCALC, TRIG, CHOLHDL, LDLDIRECT in the last 72 hours. Thyroid Function Tests: No results for input(s): TSH, T4TOTAL, FREET4, T3FREE, THYROIDAB in the last 72 hours. Anemia Panel: No results for input(s): VITAMINB12, FOLATE, FERRITIN,  TIBC, IRON, RETICCTPCT in the last 72 hours. Urine analysis:    Component Value Date/Time   COLORURINE YELLOW 08/29/2009 2256   APPEARANCEUR CLEAR 08/29/2009 2256   LABSPEC 1.013 08/29/2009 2256   PHURINE 8.0 08/29/2009 2256   GLUCOSEU NEGATIVE 08/29/2009 2256   HGBUR NEGATIVE 08/29/2009 2256   BILIRUBINUR NEGATIVE 08/29/2009 2256   KETONESUR NEGATIVE 08/29/2009 2256   PROTEINUR NEGATIVE 08/29/2009 2256   UROBILINOGEN 0.2 08/29/2009 2256   NITRITE NEGATIVE 08/29/2009 2256   LEUKOCYTESUR  08/29/2009 2256    NEGATIVE MICROSCOPIC NOT DONE ON URINES WITH NEGATIVE PROTEIN, BLOOD, LEUKOCYTES, NITRITE, OR GLUCOSE <1000 mg/dL.   Sepsis Labs: @LABRCNTIP (procalcitonin:4,lacticidven:4) )No results found for this or any previous visit (from the past 240 hour(s)).   Radiological Exams on Admission: Dg Chest 2 View  Result Date: 12/11/2017 CLINICAL DATA:  EMS stated, she's having chest pain but no now. Pt at work started having chest pain. Took a nitro 15 min later pain was gone. Hx of CAD, HTN EXAM: CHEST - 2 VIEW COMPARISON:  11/23/2015 FINDINGS: The heart size and mediastinal contours are within normal limits. Both lungs are clear. The visualized skeletal structures are unremarkable. IMPRESSION: No active cardiopulmonary disease. Electronically Signed   By: Norva Pavlov M.D.   On: 12/11/2017 14:43     EKG:  Not done in ED, will get one. Sinus rhythm, QTC 427, low voltage, poor R-wave progression.  Assessment/Plan Principal Problem:   Chest pain Active Problems:   HTN (hypertension)   CAD (coronary artery disease), native coronary artery   Dyslipidemia, goal LDL below 70   GERD (gastroesophageal reflux disease)   Depression   Chest pain and hx of CA: patient seems to have an episode of typical chest pain, resolved with nitroglycerin and aspirin. She has history of CAD with DES stent placement 2017. Initial trop negative.  - will place on Tele bed for obs - cycle CE q6 x3 and  repeat EKG in the am  - prn Nitroglycerin, Morphine, and aspirin, plavix, crestor, Imdur - Risk factor stratification: will check FLP and A1C  - 2d echo - inpt card consult was requested via Epic  HTN:  -Continue home medications: amlodipine, lisinopril, -IV hydralazine prn  HLD: -Crestor  GERD: -Protonix  Depression: Stable, no suicidal or homicidal ideations. -Continue home medications: celexa  DVT  ppx: SQ Lovenox Code Status: Full code Family Communication:  Yes, patient's daghter  at bed side Disposition Plan:  Anticipate discharge back to previous home environment Consults called:  none Admission status: Obs / tele     Date of Service 12/11/2017    Lorretta Harp [facility] Pager [phone]  If 7PM-7AM, please contact night-coverage [email or web address] Password [facility] 12/11/2017, 9:49 PM

## 2017-12-11 NOTE — ED Triage Notes (Signed)
EMS stated, shes having chest pain but no now. Pt at work started having chest pain. Took a nitro 15 min later pain was gone.  Shes had 4 baby ASA , has 20g in rt.  AC.   When sh had pain it was a 6/10

## 2017-12-11 NOTE — ED Provider Notes (Signed)
[facility] EMERGENCY DEPARTMENT Provider Note   CSN: [id number] Arrival date & time: 12/11/17  1351     History   Chief Complaint Chief Complaint  Patient presents with  . Chest Pain    HPI Tonya Bryant is a 67 y.o. 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  She states she began having sharp chest pain with associated diaphoresis and radiation down her left arm.  She states she took a nitroglycerin and sat down with improvement in symptoms.  She states her chest does not hurt since that time.  Denies associated shortness of breath, palpations, or nausea.  States she has been having more swelling of her lower legs this past week, however had this is been intermittent long-standing issue.  Her cardiologist is Dr. Mayford Knife.  Per chart review, with left heart cath done in March 2017 with multiple areas of stenosis status post DES implantation in the LAD and left circumflex.  She had a stress test in February 2018 which was nonischemic.  The history is provided by the patient.    Past Medical History:  Diagnosis Date  . Anxiety   . CAD (coronary artery disease), native coronary artery 11/02/2015   Ost RCA lesion, 30% stenosed, Prox LAD lesion, 50% stenosed, 1st Diag lesion, 65% stenosed, Ramus lesion, 50% stenosed, Mid Cx lesion, 80% stenosed, Dist LAD lesion, 75% stenosed. S/P DES to distal LAD and mid Cx 10/2015  . Depression (emotion)   . Dyslipidemia, goal LDL below 70 02/07/2016  . Full dentures   . Hypertension     Patient Active Problem List   Diagnosis Date Noted  . GERD (gastroesophageal reflux disease) 12/11/2017  . Depression 12/11/2017  . Edema extremities 07/13/2017  . Dyslipidemia, goal LDL below 70 02/07/2016  . CAD (coronary artery disease), native coronary artery 11/02/2015  . Chest pain 10/31/2015  . HTN  (hypertension) 10/31/2015  . Bradycardia 10/31/2015  . Obesity 10/31/2015  . Ingrown right greater toenail 04/27/2013    Past Surgical History:  Procedure Laterality Date  . BREAST EXCISIONAL BIOPSY Left   . BREAST SURGERY     lt br bx-neg  . CARDIAC CATHETERIZATION N/A 11/01/2015   Procedure: Left Heart Cath and Coronary Angiography;  Surgeon: Tonny Bollman, MD;  Location: [facility] INVASIVE CV LAB;  Service: Cardiovascular;  Laterality: N/A;  . CARPAL TUNNEL RELEASE Left 07/07/2013   Procedure: LEFT CARPAL TUNNEL RELEASE;  Surgeon: Wyn Forster., MD;  Location: [facility];  Service: Orthopedics;  Laterality: Left;  . COLONOSCOPY    . TOE OSTEOTOMY     rt big toe  . TOOTH EXTRACTION    . TRIGGER FINGER RELEASE Left 07/07/2013   Procedure: LEFT THUMB RELEASE A-1 PULLEY;  Surgeon: Wyn Forster., MD;  Location: [facility];  Service: Orthopedics;  Laterality: Left;  . TUBAL LIGATION       OB History   None      Home Medications    Prior to Admission medications   Medication Sig Start Date End Date Taking? Authorizing Provider  amLODipine (NORVASC) 10 MG tablet Take 1 tablet (10 mg total) by mouth daily. Patient taking differently: Take 10 mg by mouth daily. Blood pressure 11/02/15  Yes Ghimire, Werner Lean, MD  aspirin 81 MG tablet Take 1 tablet (81 mg total) by mouth daily. 11/02/15  Yes Ghimire, Werner Lean, MD  citalopram (CELEXA) 40 MG tablet Take 0.5 tablets (20 mg total) by mouth daily. Patient taking differently: Take 20 mg by mouth daily. Anxiety and depression 11/02/15  Yes Ghimire, Werner Lean, MD  clopidogrel (PLAVIX) 75 MG tablet TAKE 1 TABLET BY MOUTH ONCE DAILY 11/10/17  Yes Turner, Cornelious Bryant, MD  isosorbide mononitrate (IMDUR) 60 MG 24 hr tablet Take 1 tablet (60 mg total) by mouth daily. Patient taking differently: Take 60 mg by mouth daily. Chest pressure 10/08/16  Yes Turner, Traci R, MD  lisinopril (PRINIVIL,ZESTRIL) 20 MG tablet Take 1  tablet (20 mg total) by mouth daily. Patient taking differently: Take 20 mg by mouth daily. Blood pressure 11/02/15  Yes Ghimire, Werner Lean, MD  nitroGLYCERIN (NITROSTAT) 0.4 MG SL tablet Place 1 tablet (0.4 mg total) under the tongue every 5 (five) minutes as needed for chest pain (for 3 doses only). 11/09/17  Yes Turner, Cornelious Bryant, MD  pantoprazole (PROTONIX) 40 MG tablet Take 40 mg by mouth daily. Heart burn   Yes [provider]  rosuvastatin (CRESTOR) 40 MG tablet Take 1 tablet (40 mg total) by mouth daily. Patient taking differently: Take 40 mg by mouth at bedtime.  08/21/17   Quintella Reichert, MD    Family History Family History  Problem Relation Age of Onset  . Cancer Mother   . Diabetes Mother   . Stroke Mother   . Hypertension Mother   . Stroke Father   . Diabetes Father   . Hypertension Father   . Gout Father     Social History Social History   Tobacco Use  . Smoking status: Never Smoker  . Smokeless tobacco: Never Used  Substance Use Topics  . Alcohol use: Yes    Alcohol/week: 0.5 oz    Types: 1 Standard drinks or equivalent per week  . Drug use: No     Allergies   Patient has no known allergies.   Review of Systems Review of Systems  Constitutional: Positive for diaphoresis.  Respiratory: Negative for shortness of breath.   Cardiovascular: Positive for chest pain and leg swelling. Negative for palpitations.  Gastrointestinal: Negative for nausea.  All other systems reviewed and are negative.    Physical Exam Updated Vital Signs BP (!) 171/78 (BP Location: Right Arm)   Pulse 60   Temp 97.8 F (36.6 C)   Resp 16   Ht 5' (1.524 m)   Wt 93 kg (205 lb)   SpO2 98%   BMI 40.04 kg/m   Physical Exam  Constitutional: She appears well-developed and well-nourished. No distress.  HENT:  Head: Normocephalic and atraumatic.  Eyes: Conjunctivae are normal.  Neck: Normal range of motion. Neck supple. No JVD present. No tracheal deviation present.    Cardiovascular: Normal rate, regular rhythm and intact distal pulses.  Pulmonary/Chest: Effort normal and breath sounds normal. No respiratory distress. She exhibits no tenderness.  Abdominal: Soft. Bowel sounds are normal. She exhibits no distension. There is no tenderness.  Musculoskeletal:  Bilateral pretibial 2-3+ pitting edema  Neurological: She is alert.  Skin: Skin is warm. She is not diaphoretic.  Psychiatric: She has a normal mood and affect. Her behavior is normal.  Nursing note and vitals reviewed.    ED Treatments / Results  Labs (all labs ordered are listed, but only abnormal results are displayed) Labs Reviewed  BASIC METABOLIC PANEL - Abnormal; Notable for the following components:      Result Value  CO2 19 (*)    All other components within normal limits  CBC  RAPID URINE DRUG SCREEN, HOSP PERFORMED  HEMOGLOBIN A1C  LIPID PANEL  TROPONIN I  TROPONIN I  TROPONIN I  HIV ANTIBODY (ROUTINE TESTING)  I-STAT TROPONIN, ED  I-STAT TROPONIN, ED    EKG None  Radiology Dg Chest 2 View  Result Date: 12/11/2017 CLINICAL DATA:  EMS stated, she's having chest pain but no now. Pt at work started having chest pain. Took a nitro 15 min later pain was gone. Hx of CAD, HTN EXAM: CHEST - 2 VIEW COMPARISON:  11/23/2015 FINDINGS: The heart size and mediastinal contours are within normal limits. Both lungs are clear. The visualized skeletal structures are unremarkable. IMPRESSION: No active cardiopulmonary disease. Electronically Signed   By: Norva PavlovElizabeth  Brown M.D.   On: 12/11/2017 14:43    Procedures Procedures (including critical care time)  Medications Ordered in ED Medications  amLODipine (NORVASC) tablet 10 mg (has no administration in time range)  aspirin EC tablet 81 mg (has no administration in time range)  citalopram (CELEXA) tablet 20 mg (has no administration in time range)  clopidogrel (PLAVIX) tablet 75 mg (has no administration in time range)  isosorbide  mononitrate (IMDUR) 24 hr tablet 60 mg (has no administration in time range)  lisinopril (PRINIVIL,ZESTRIL) tablet 20 mg (has no administration in time range)  nitroGLYCERIN (NITROSTAT) SL tablet 0.4 mg (has no administration in time range)  pantoprazole (PROTONIX) EC tablet 40 mg (has no administration in time range)  rosuvastatin (CRESTOR) tablet 40 mg (has no administration in time range)  morphine 4 MG/ML injection 2 mg (has no administration in time range)  acetaminophen (TYLENOL) tablet 650 mg (has no administration in time range)  ondansetron (ZOFRAN) injection 4 mg (has no administration in time range)  enoxaparin (LOVENOX) injection 40 mg (has no administration in time range)  zolpidem (AMBIEN) tablet 5 mg (has no administration in time range)  hydrALAZINE (APRESOLINE) injection 5 mg (has no administration in time range)     Initial Impression / Assessment and Plan / ED Course  I have reviewed the triage vital signs and the nursing notes.  Pertinent labs & imaging results that were available during my care of the patient were reviewed by me and considered in my medical decision making (see chart for details).  Clinical Course as of Dec 12 2130  Fri Dec 11, 2017  2104 Dr. Clyde LundborgNiu accepting admission.   [JR]    Clinical Course User Index [JR] Robinson, [place] N, PA-C    Patient with history of CAD s/p stents, HTN, HLD, presenting to the ED with acute onset of chest pain with diaphoresis and radiation down her left arm on exertion, improved with rest and nitroglycerin.  She is asymptomatic in the ED.  Troponin negative, x-ray negative, EKG without new changes.  Given patient's significant risk factors, recommend admission for ACS rule out.  Patient discussed with Dr. Jacqulyn BathLong, who evaluated patient and agrees with plan for admission.  Dr. Clyde LundborgNiu with [facility] accepting admission.  The patient appears reasonably stabilized for admission considering the current resources, flow, and capabilities  available in the ED at this time, and I doubt any other [facility] further screening and/or treatment in the ED prior to admission.  Final Clinical Impressions(s) / ED Diagnoses   Final diagnoses:  Precordial chest pain    ED Discharge Orders    None       Robinson, [place] N, [place] JerseyPA-C 12/11/17 2144  Maia Plan, MD 12/12/17 [phone]

## 2017-12-12 ENCOUNTER — Observation Stay (HOSPITAL_BASED_OUTPATIENT_CLINIC_OR_DEPARTMENT_OTHER): Payer: Medicare HMO

## 2017-12-12 DIAGNOSIS — K219 Gastro-esophageal reflux disease without esophagitis: Secondary | ICD-10-CM | POA: Diagnosis not present

## 2017-12-12 DIAGNOSIS — R079 Chest pain, unspecified: Secondary | ICD-10-CM

## 2017-12-12 DIAGNOSIS — I2510 Atherosclerotic heart disease of native coronary artery without angina pectoris: Secondary | ICD-10-CM | POA: Diagnosis not present

## 2017-12-12 DIAGNOSIS — M7989 Other specified soft tissue disorders: Secondary | ICD-10-CM | POA: Diagnosis not present

## 2017-12-12 DIAGNOSIS — R072 Precordial pain: Secondary | ICD-10-CM | POA: Diagnosis not present

## 2017-12-12 DIAGNOSIS — R69 Illness, unspecified: Secondary | ICD-10-CM | POA: Diagnosis not present

## 2017-12-12 DIAGNOSIS — E669 Obesity, unspecified: Secondary | ICD-10-CM | POA: Diagnosis not present

## 2017-12-12 DIAGNOSIS — Z6839 Body mass index (BMI) 39.0-39.9, adult: Secondary | ICD-10-CM | POA: Diagnosis not present

## 2017-12-12 DIAGNOSIS — I25119 Atherosclerotic heart disease of native coronary artery with unspecified angina pectoris: Secondary | ICD-10-CM

## 2017-12-12 DIAGNOSIS — I10 Essential (primary) hypertension: Secondary | ICD-10-CM | POA: Diagnosis not present

## 2017-12-12 DIAGNOSIS — E785 Hyperlipidemia, unspecified: Secondary | ICD-10-CM | POA: Diagnosis not present

## 2017-12-12 LAB — ECHOCARDIOGRAM COMPLETE
Height: 60 in
Weight: 3152 oz

## 2017-12-12 LAB — LIPID PANEL
CHOL/HDL RATIO: 2 ratio
Cholesterol: 116 mg/dL (ref 0–200)
HDL: 58 mg/dL (ref >40)
LDL CALC: 47 mg/dL (ref 0–99)
Triglycerides: 54 mg/dL (ref <150)
VLDL: 11 mg/dL (ref 0–40)

## 2017-12-12 LAB — TROPONIN I

## 2017-12-12 LAB — HEMOGLOBIN A1C
HEMOGLOBIN A1C: 5.5 % (ref 4.8–5.6)
MEAN PLASMA GLUCOSE: 111.15 mg/dL

## 2017-12-12 LAB — HIV ANTIBODY (ROUTINE TESTING W REFLEX): HIV SCREEN 4TH GENERATION: NONREACTIVE

## 2017-12-12 NOTE — Progress Notes (Signed)
Patient resting comfortably during shift report. Denies complaints.  

## 2017-12-12 NOTE — Progress Notes (Signed)
PROGRESS NOTE  Natoya J SwazilandJordan ZOX:[id number] DOB: 07-02-1951 DOA: 12/11/2017 PCP: Blair HeysEhinger, Robert, MD  HPI/Recap of past 24 hours: Patient was admitted for chest pain she is a 67 year old female with known history of coronary disease S/P stent in 2017.  She is chest pain-free now.  And her troponin is negative x3 EKG non-STEMI .  Cardiology was consulted and they plan on doing exercise stress test  tomorrow.  Please see full cardiology consult.  Assessment/Plan: Principal Problem:   Chest pain Active Problems:   HTN (hypertension)   CAD (coronary artery disease), native coronary artery   Dyslipidemia, goal LDL below 70   GERD (gastroesophageal reflux disease)   Depression   Code Status:full  Family Communication: To the patient  Disposition Plan: Discharge home after exercise stress test and echocardiogram tomorrow   Consultants:  Cardiology  Procedures:  non  Antimicrobials:  non  DVT prophylaxis: Lovenox   Objective: Vitals:   12/12/17 0625 12/12/17 0631 12/12/17 1016 12/12/17 1231  BP: (!) 150/87  (!) 176/90 128/66  Pulse: 66   (!) 56  Resp:    20  Temp: 97.8 F (36.6 C)   98 F (36.7 C)  TempSrc: Oral   Oral  SpO2: 94%   97%  Weight:  89.4 kg (197 lb)    Height:        Intake/Output Summary (Last 24 hours) at 12/12/2017 1508 Last data filed at 12/12/2017 1100 Gross per 24 hour  Intake 0 ml  Output 1900 ml  Net -1900 ml   Filed Weights   12/11/17 1348 12/11/17 2236 12/12/17 0631  Weight: 93 kg (205 lb) 90.2 kg (198 lb 12.8 oz) 89.4 kg (197 lb)   Body mass index is 38.47 kg/m.  Exam:   General: Not in acute distress HEENT:       Eyes: PERRL, EOMI, no scleral icterus.       ENT: No discharge from the ears and nose, no pharynx injection, no tonsillar enlargement.        Neck: No JVD, no bruit, no mass felt. Heme: No neck lymph node enlargement. Cardiac: S1/S2, RRR, No murmurs, No gallops or rubs. Respiratory: rales, wheezing, rhonchi or  rubs. GI: Soft, nondistended, nontender, no rebound pain, no organomegaly, BS present. GU: No hematuria Ext: has trace ankle edema bilaterally. 2+DP/PT pulse bilaterally. Musculoskeletal: No joint deformities, No joint redness or warmth, no limitation of ROM in spin. Skin: No rashes.  Neuro: Alert, oriented X3, cranial nerves II-XII grossly intact, moves all extremities normally Psych: Patient is not psychotic, no suicidal or hemocidal ideation.    Data Reviewed: CBC: Recent Labs  Lab 12/11/17 1831  WBC 7.7  HGB 12.6  HCT 38.1  MCV 93.8  PLT 271   Basic Metabolic Panel: Recent Labs  Lab 12/11/17 1831  NA 137  K 3.7  CL 107  CO2 19*  GLUCOSE 92  BUN 14  CREATININE 0.74  CALCIUM 9.2   GFR: Estimated Creatinine Clearance: 69.8 mL/min (by C-G formula based on SCr of 0.74 mg/dL). Liver Function Tests: No results for input(s): AST, ALT, ALKPHOS, BILITOT, PROT, ALBUMIN in the last 168 hours. No results for input(s): LIPASE, AMYLASE in the last 168 hours. No results for input(s): AMMONIA in the last 168 hours. Coagulation Profile: No results for input(s): INR, PROTIME in the last 168 hours. Cardiac Enzymes: Recent Labs  Lab 12/11/17 2119 12/12/17 0455 12/12/17 0950  TROPONINI <0.03 <0.03 <0.03   BNP (last 3 results) No results  for input(s): PROBNP in the last 8760 hours. HbA1C: Recent Labs    12/12/17 0455  HGBA1C 5.5   CBG: No results for input(s): GLUCAP in the last 168 hours. Lipid Profile: Recent Labs    12/12/17 0455  CHOL 116  HDL 58  LDLCALC 47  TRIG 54  CHOLHDL 2.0   Thyroid Function Tests: No results for input(s): TSH, T4TOTAL, FREET4, T3FREE, THYROIDAB in the last 72 hours. Anemia Panel: No results for input(s): VITAMINB12, FOLATE, FERRITIN, TIBC, IRON, RETICCTPCT in the last 72 hours. Urine analysis:    Component Value Date/Time   COLORURINE YELLOW 08/29/2009 2256   APPEARANCEUR CLEAR 08/29/2009 2256   LABSPEC 1.013 08/29/2009 2256    PHURINE 8.0 08/29/2009 2256   GLUCOSEU NEGATIVE 08/29/2009 2256   HGBUR NEGATIVE 08/29/2009 2256   BILIRUBINUR NEGATIVE 08/29/2009 2256   KETONESUR NEGATIVE 08/29/2009 2256   PROTEINUR NEGATIVE 08/29/2009 2256   UROBILINOGEN 0.2 08/29/2009 2256   NITRITE NEGATIVE 08/29/2009 2256   LEUKOCYTESUR  08/29/2009 2256    NEGATIVE MICROSCOPIC NOT DONE ON URINES WITH NEGATIVE PROTEIN, BLOOD, LEUKOCYTES, NITRITE, OR GLUCOSE <1000 mg/dL.   Sepsis Labs: @LABRCNTIP (procalcitonin:4,lacticidven:4)  )No results found for this or any previous visit (from the past 240 hour(s)).    Studies: No results found.  Scheduled Meds: . amLODipine  10 mg Oral Daily  . aspirin EC  81 mg Oral Daily  . citalopram  20 mg Oral Daily  . clopidogrel  75 mg Oral Daily  . enoxaparin (LOVENOX) injection  40 mg Subcutaneous Q24H  . isosorbide mononitrate  60 mg Oral Daily  . lisinopril  20 mg Oral Daily  . pantoprazole  40 mg Oral Daily  . rosuvastatin  40 mg Oral QHS    Continuous Infusions:   LOS: 0 days   Assessment/Plan Principal Problem:   Chest pain Active Problems:   HTN (hypertension)   CAD (coronary artery disease), native coronary artery   Dyslipidemia, goal LDL below 70   GERD (gastroesophageal reflux disease)   Depression   Chest pain and hx of CA: patient seems to have an episode of typical chest pain, resolved with nitroglycerin and aspirin. She has history of CAD with DES stent placement 2017. Initial trop negative.  Patient is chest pain-free EKG no acute changes troponin negative x3 - 2d echo pending - inpt card consult appreciate recommendation.  Patient is for stress test in the morning  HTN:  -Continue home medications: amlodipine, lisinopril, -IV hydralazine prn  HLD: -Crestor  GERD: -Protonix  Depression: Stable, no suicidal or homicidal ideations. -Continue home medications: celexa    Myrtie Neither, MD [facility]  To reach me or the doctor on  call, go to: [email or web address] Password [email or web address]  12/12/2017, 3:08 PM

## 2017-12-12 NOTE — Consult Note (Signed)
Cardiology Consultation:   Patient ID: Tonya Bryant; [id number]; 26-Jan-1951   Admit date: 12/11/2017 Date of Consult: 12/12/2017  Primary Care Provider: Blair Heys, MD Primary Cardiologist: Armanda Magic, MD  Primary Electrophysiologist:  na  Patient Profile:   Tonya Bryant is a 67 y.o. female with a hx of CAD with DES to LCX and LAD in 10/2015 with residual 65% diag disease, chronic angina, GERD, HTN, HL who is being seen today for the evaluation of chest pain at the request of Dr Barrie Folk.  History of Present Illness:   Ms. Bryant is a 67 yo female history of CAD with DES to LCX and LAD in 10/2015 with residual 65% diag disease, chronic angina, GERD, HTN, HL admitted with chest pain. 10/2016 nuclear stress no ischemia. 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 6/10 chest pressure midchest, SOB, diaphoretic. Stopped and rested, took NG. Pain then resolved. She reports she sometimes has this type of pain with higher levels of activity.    K 3.7 Cr 0.74 WBC 7.7 Hgb 12.6 Plt 271 UDS neg  Trop neg x 4 CXR no acute process  EKG SR, no ischemic changes 09/2016 nuclear stress: no ischemia  10/2015 cath  Ost RCA lesion, 30% stenosed.  Prox LAD lesion, 50% stenosed.  1st Diag lesion, 65% stenosed.  Ramus lesion, 50% stenosed.  Mid Cx lesion, 80% stenosed. Post intervention, there is a 0% residual stenosis.  Dist LAD lesion, 75% stenosed. Post intervention, there is a 0% residual stenosis.   1. Severe 2 vessel CAD treated successfully with DES implantation in the LAD and LCx 2. Known normal LV function by echo  Past Medical History:  Diagnosis Date  . Anxiety   . CAD (coronary artery disease), native coronary artery 11/02/2015   Ost RCA lesion, 30% stenosed, Prox LAD lesion, 50% stenosed, 1st Diag lesion, 65% stenosed, Ramus lesion, 50% stenosed, Mid Cx lesion, 80% stenosed, Dist LAD lesion, 75% stenosed. S/P DES to distal LAD and mid Cx  10/2015  . Depression (emotion)   . Dyslipidemia, goal LDL below 70 02/07/2016  . Full dentures   . Hypertension     Past Surgical History:  Procedure Laterality Date  . BREAST EXCISIONAL BIOPSY Left   . BREAST SURGERY     lt br bx-neg  . CARDIAC CATHETERIZATION N/A 11/01/2015   Procedure: Left Heart Cath and Coronary Angiography;  Surgeon: Tonny Bollman, MD;  Location: [facility] INVASIVE CV LAB;  Service: Cardiovascular;  Laterality: N/A;  . CARPAL TUNNEL RELEASE Left 07/07/2013   Procedure: LEFT CARPAL TUNNEL RELEASE;  Surgeon: Wyn Forster., MD;  Location: [facility];  Service: Orthopedics;  Laterality: Left;  . COLONOSCOPY    . TOE OSTEOTOMY     rt big toe  . TOOTH EXTRACTION    . TRIGGER FINGER RELEASE Left 07/07/2013   Procedure: LEFT THUMB RELEASE A-1 PULLEY;  Surgeon: Wyn Forster., MD;  Location: [facility];  Service: Orthopedics;  Laterality: Left;  . TUBAL LIGATION         Inpatient Medications: Scheduled Meds: . amLODipine  10 mg Oral Daily  . aspirin EC  81 mg Oral Daily  . citalopram  20 mg Oral Daily  . clopidogrel  75 mg Oral Daily  . enoxaparin (LOVENOX) injection  40 mg Subcutaneous Q24H  . isosorbide mononitrate  60 mg Oral Daily  . lisinopril  20 mg Oral Daily  . pantoprazole  40 mg Oral Daily  . rosuvastatin  40 mg Oral QHS   Continuous Infusions:  PRN Meds: acetaminophen, hydrALAZINE, morphine injection, nitroGLYCERIN, ondansetron (ZOFRAN) IV, zolpidem  Allergies:   No Known Allergies  Social History:   Social History   Socioeconomic History  . Marital status: Single    Spouse name: Not on file  . Number of children: Not on file  . Years of education: Not on file  . Highest education level: Not on file  Occupational History  . Not on file  Social Needs  . Financial resource strain: Not on file  . Food insecurity:    Worry: Not on file    Inability: Not on file  . Transportation needs:    Medical: Not  on file    Non-medical: Not on file  Tobacco Use  . Smoking status: Never Smoker  . Smokeless tobacco: Never Used  Substance and Sexual Activity  . Alcohol use: Yes    Alcohol/week: 0.5 oz    Types: 1 Standard drinks or equivalent per week  . Drug use: No  . Sexual activity: Not on file  Lifestyle  . Physical activity:    Days per week: Not on file    Minutes per session: Not on file  . Stress: Not on file  Relationships  . Social connections:    Talks on phone: Not on file    Gets together: Not on file    Attends religious service: Not on file    Active member of club or organization: Not on file    Attends meetings of clubs or organizations: Not on file    Relationship status: Not on file  . Intimate partner violence:    Fear of current or ex partner: Not on file    Emotionally abused: Not on file    Physically abused: Not on file    Forced sexual activity: Not on file  Other Topics Concern  . Not on file  Social History Narrative  . Not on file    Family History:    Family History  Problem Relation Age of Onset  . Cancer Mother   . Diabetes Mother   . Stroke Mother   . Hypertension Mother   . Stroke Father   . Diabetes Father   . Hypertension Father   . Gout Father      ROS:  Please see the history of present illness.   All other ROS reviewed and negative.     Physical Exam/Data:   Vitals:   12/12/17 0625 12/12/17 0631 12/12/17 1016 12/12/17 1231  BP: (!) 150/87  (!) 176/90 128/66  Pulse: 66   (!) 56  Resp:    20  Temp: 97.8 F (36.6 C)   98 F (36.7 C)  TempSrc: Oral   Oral  SpO2: 94%   97%  Weight:  197 lb (89.4 kg)    Height:        Intake/Output Summary (Last 24 hours) at 12/12/2017 1424 Last data filed at 12/12/2017 1100 Gross per 24 hour  Intake 0 ml  Output 1900 ml  Net -1900 ml   Filed Weights   12/11/17 1348 12/11/17 2236 12/12/17 0631  Weight: 205 lb (93 kg) 198 lb 12.8 oz (90.2 kg) 197 lb (89.4 kg)   Body mass index is 38.47  kg/m. General:  Well nourished, well developed, in no acute distress HEENT: normal Lymph: no adenopathy Neck: no JVD Endocrine:  No thryomegaly Cardiac:  normal S1, S2;  RRR; no murmur  Lungs:  clear to auscultation bilaterally, no wheezing, rhonchi or rales  Abd: soft, nontender, no hepatomegaly  Ext: trace bilateral edema Musculoskeletal:  No deformities, BUE and BLE strength normal and equal Skin: warm and dry  Neuro:  CNs 2-12 intact, no focal abnormalities noted Psych:  Normal affect    Laboratory Data:  Chemistry Recent Labs  Lab 12/11/17 1831  NA 137  K 3.7  CL 107  CO2 19*  GLUCOSE 92  BUN 14  CREATININE 0.74  CALCIUM 9.2  GFRNONAA >60  GFRAA >60  ANIONGAP 11    No results for input(s): PROT, ALBUMIN, AST, ALT, ALKPHOS, BILITOT in the last 168 hours. Hematology Recent Labs  Lab 12/11/17 1831  WBC 7.7  RBC 4.06  HGB 12.6  HCT 38.1  MCV 93.8  MCH 31.0  MCHC 33.1  RDW 13.0  PLT 271   Cardiac Enzymes Recent Labs  Lab 12/11/17 2119 12/12/17 0455 12/12/17 0950  TROPONINI <0.03 <0.03 <0.03    Recent Labs  Lab 12/11/17 1444 12/11/17 1847  TROPIPOC 0.00 0.00    BNPNo results for input(s): BNP, PROBNP in the last 168 hours.  DDimer No results for input(s): DDIMER in the last 168 hours.  Radiology/Studies:  Dg Chest 2 View  Result Date: 12/11/2017 CLINICAL DATA:  EMS stated, she's having chest pain but no now. Pt at work started having chest pain. Took a nitro 15 min later pain was gone. Hx of CAD, HTN EXAM: CHEST - 2 VIEW COMPARISON:  11/23/2015 FINDINGS: The heart size and mediastinal contours are within normal limits. Both lungs are clear. The visualized skeletal structures are unremarkable. IMPRESSION: No active cardiopulmonary disease. Electronically Signed   By: Norva PavlovElizabeth  Brown M.D.   On: 12/11/2017 14:43    Assessment and Plan:   1. Chest pain - known history of CAD with prior stenting - presents with episode of exertional chest pain.  No objective evidence of ischemia by EKG or enzymes - some history of chornic angina. It is difficult to tell for her if its changes, because she in general is more active and thinks that's what she gets the symptoms.  - f/u echo today. Plan for exercise nuclear stress tomorrow pending echo. Make npo at midnight - continue current medical therapy   For questions or updates, please contact [facility] Please consult [email or web address] for contact info under Cardiology/STEMI.   Joanie CoddingtonSigned, Branch, Jonathan, MD  12/12/2017 2:24 PM

## 2017-12-12 NOTE — Progress Notes (Signed)
  Echocardiogram 2D Echocardiogram has been performed.  Janalyn HarderWest, Kayleah Appleyard R 12/12/2017, 3:42 PM

## 2017-12-13 ENCOUNTER — Observation Stay (HOSPITAL_BASED_OUTPATIENT_CLINIC_OR_DEPARTMENT_OTHER): Payer: Medicare HMO

## 2017-12-13 DIAGNOSIS — R079 Chest pain, unspecified: Secondary | ICD-10-CM

## 2017-12-13 DIAGNOSIS — I25119 Atherosclerotic heart disease of native coronary artery with unspecified angina pectoris: Secondary | ICD-10-CM | POA: Diagnosis not present

## 2017-12-13 LAB — NM MYOCAR MULTI W/SPECT W/WALL MOTION / EF
CHL CUP RESTING HR STRESS: 55 {beats}/min
CSEPEDS: 31 s
Estimated workload: 1 METS
Exercise duration (min): 5 min
Peak HR: 86 {beats}/min

## 2017-12-13 MED ORDER — TECHNETIUM TC 99M TETROFOSMIN IV KIT
30.0000 | PACK | Freq: Once | INTRAVENOUS | Status: AC | PRN
  Administered 2017-12-13: 30 via INTRAVENOUS

## 2017-12-13 MED ORDER — REGADENOSON 0.4 MG/5ML IV SOLN
0.4000 mg | Freq: Once | INTRAVENOUS | Status: AC
  Administered 2017-12-13: 0.4 mg via INTRAVENOUS

## 2017-12-13 MED ORDER — TECHNETIUM TO 99M ALBUMIN AGGREGATED: 4.1200 | Freq: Once | INTRAVENOUS | Status: DC | PRN

## 2017-12-13 MED ORDER — ISOSORBIDE MONONITRATE ER 60 MG PO TB24: 90.0000 mg | ORAL_TABLET | Freq: Every day | ORAL | 11 refills | Status: DC

## 2017-12-13 MED ORDER — ISOSORBIDE MONONITRATE ER 60 MG PO TB24: 90.0000 mg | ORAL_TABLET | Freq: Every day | ORAL | Status: DC

## 2017-12-13 MED ORDER — TECHNETIUM TC 99M TETROFOSMIN IV KIT
10.0000 | PACK | Freq: Once | INTRAVENOUS | Status: AC | PRN
  Administered 2017-12-13: 10 via INTRAVENOUS

## 2017-12-13 MED ORDER — REGADENOSON 0.4 MG/5ML IV SOLN
INTRAVENOUS | Status: AC
  Filled 2017-12-13: qty 5

## 2017-12-13 NOTE — Discharge Summary (Signed)
Discharge Summary  Tonya Bryant ZOX:[id number] DOB: 02-15-1951  PCP: Blair Heys, MD  Admit date: 12/11/2017 Discharge date: 12/13/2017  Time spent: 30 minutes  Recommendations for Outpatient Follow-up:  1. Primary care doctor  Discharge Diagnoses:  Active Hospital Problems   Diagnosis Date Noted  . Chest pain 10/31/2015  . GERD (gastroesophageal reflux disease) 12/11/2017  . Depression 12/11/2017  . Dyslipidemia, goal LDL below 70 02/07/2016  . CAD (coronary artery disease), native coronary artery 11/02/2015  . HTN (hypertension) 10/31/2015    Resolved Hospital Problems  No resolved problems to display.    Discharge Condition: Improved  Diet recommendation: Cardiac  Vitals:   12/13/17 1036 12/13/17 1223  BP: 140/83 (!) 113/59  Pulse:  (!) 59  Resp:  20  Temp:  97.6 F (36.4 C)  SpO2:  95%    History of present illness:   HPI: Tonya Bryant is a 67 y.o. female with medical history significant of or hypertension, hyperlipidemia, CAD, s/p of DES stent placement, obesity, GERD, depression, who presents with chest pain.   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 The pain is located in the substernal area, dull, 6 out of 10 in severity, radiating to the left arm. She took nitroglycerin and aspirin 325 mg. Her chest pain has gradually resolved. Currently she is chest pain-free. Patient states that she has SOB sometimes on exertion. No cough. Denies tenderness in the calf areas. Patient has nausea, no vomiting, diarrhea or abdominal pain. Denies symptoms of UTI or unilateral weakness. Patient states that by the end of each day, she has mild ankle swelling.     Hospital Course:  Principal Problem:   Chest pain Active Problems:   HTN (hypertension)   CAD (coronary artery disease), native coronary artery   Dyslipidemia, goal LDL below 70   GERD (gastroesophageal reflux disease)    Depression Patient was admitted to observation unit she ruled out for MI by EKG and enzyme criteria she underwent Cardiolite stress test which was negative.  She is being discharged in improved condition to follow-up with her regular PCP and cardiologist   Procedures:    Lexicon stress test   Consultations:  Cardiology  Discharge Exam: BP (!) 113/59 (BP Location: Left Arm)   Pulse (!) 59   Temp 97.6 F (36.4 C) (Oral)   Resp 20   Ht 5' (1.524 m)   Wt 90.6 kg (199 lb 11.2 oz) Comment: scale b  SpO2 95%   BMI 39.00 kg/m      General: Not in acute distress HEENT:       Eyes: PERRL, EOMI, no scleral icterus.       ENT: No discharge from the ears and nose, no pharynx injection, no tonsillar enlargement.        Neck: No JVD, no bruit, no mass felt. Heme: No neck lymph node enlargement. Cardiac: S1/S2, RRR, No murmurs, No gallops or rubs. Respiratory: rales, wheezing, rhonchi or rubs. GI: Soft, nondistended, nontender, no rebound pain, no organomegaly, BS present. GU: No hematuria Ext: has trace ankle edema bilaterally. 2+DP/PT pulse bilaterally. Musculoskeletal: No joint deformities, No joint redness or warmth, no limitation of ROM in spin. Skin: No rashes.  Neuro: Alert, oriented X3, cranial nerves II-XII grossly intact, moves all extremities normally Psych: Patient is not psychotic, no suicidal or hemocidal ideation.    Discharge Instructions You were cared for by a hospitalist during your hospital  stay. If you have any questions about your discharge medications or the care you received while you were in the hospital after you are discharged, you can call the unit and asked to speak with the hospitalist on call if the hospitalist that took care of you is not available. Once you are discharged, your primary care physician will handle any further medical issues. Please note that NO REFILLS for any discharge medications will be authorized once you are discharged, as it is  imperative that you return to your primary care physician (or establish a relationship with a primary care physician if you do not have one) for your aftercare needs so that they can reassess your need for medications and monitor your lab values.  Discharge Instructions    Diet - low sodium heart healthy   Complete by:  As directed    Increase activity slowly   Complete by:  As directed      Allergies as of 12/13/2017   No Known Allergies     Medication List    TAKE these medications   amLODipine 10 MG tablet Commonly known as:  NORVASC Take 1 tablet (10 mg total) by mouth daily. What changed:  additional instructions   aspirin 81 MG tablet Take 1 tablet (81 mg total) by mouth daily.   citalopram 40 MG tablet Commonly known as:  CELEXA Take 0.5 tablets (20 mg total) by mouth daily. What changed:  additional instructions   clopidogrel 75 MG tablet Commonly known as:  PLAVIX TAKE 1 TABLET BY MOUTH ONCE DAILY   isosorbide mononitrate 60 MG 24 hr tablet Commonly known as:  IMDUR Take 1 tablet (60 mg total) by mouth daily. What changed:  additional instructions   lisinopril 20 MG tablet Commonly known as:  PRINIVIL,ZESTRIL Take 1 tablet (20 mg total) by mouth daily. What changed:  additional instructions   nitroGLYCERIN 0.4 MG SL tablet Commonly known as:  NITROSTAT Place 1 tablet (0.4 mg total) under the tongue every 5 (five) minutes as needed for chest pain (for 3 doses only).   pantoprazole 40 MG tablet Commonly known as:  PROTONIX Take 40 mg by mouth daily. Heart burn   rosuvastatin 40 MG tablet Commonly known as:  CRESTOR Take 1 tablet (40 mg total) by mouth daily. What changed:  when to take this      No Known Allergies    The results of significant diagnostics from this hospitalization (including imaging, microbiology, ancillary and laboratory) are listed below for reference.    Significant Diagnostic Studies: Dg Chest 2 View  Result Date:  12/11/2017 CLINICAL DATA:  EMS stated, she's having chest pain but no now. Pt at work started having chest pain. Took a nitro 15 min later pain was gone. Hx of CAD, HTN EXAM: CHEST - 2 VIEW COMPARISON:  11/23/2015 FINDINGS: The heart size and mediastinal contours are within normal limits. Both lungs are clear. The visualized skeletal structures are unremarkable. IMPRESSION: No active cardiopulmonary disease. Electronically Signed   By: Norva PavlovElizabeth  Brown M.D.   On: 12/11/2017 14:43    Microbiology: No results found for this or any previous visit (from the past 240 hour(s)).   Labs: Basic Metabolic Panel: Recent Labs  Lab 12/11/17 1831  NA 137  K 3.7  CL 107  CO2 19*  GLUCOSE 92  BUN 14  CREATININE 0.74  CALCIUM 9.2   Liver Function Tests: No results for input(s): AST, ALT, ALKPHOS, BILITOT, PROT, ALBUMIN in the last 168 hours. No  results for input(s): LIPASE, AMYLASE in the last 168 hours. No results for input(s): AMMONIA in the last 168 hours. CBC: Recent Labs  Lab 12/11/17 1831  WBC 7.7  HGB 12.6  HCT 38.1  MCV 93.8  PLT 271   Cardiac Enzymes: Recent Labs  Lab 12/11/17 2119 12/12/17 0455 12/12/17 0950  TROPONINI <0.03 <0.03 <0.03   BNP: BNP (last 3 results) No results for input(s): BNP in the last 8760 hours.  ProBNP (last 3 results) No results for input(s): PROBNP in the last 8760 hours.  CBG: No results for input(s): GLUCAP in the last 168 hours.     Signed:  Myrtie Neither, MD [facility] 12/13/2017, 1:22 PM

## 2017-12-13 NOTE — Progress Notes (Signed)
Patient sleeping on rounding.

## 2017-12-13 NOTE — Progress Notes (Signed)
Pt discharged with family at bedside. IV removed, no complications. Pt denies complaints. Pt independently dressed, pts AVS was reviewed, no questions post-review.   Pt escorted off the floor in a wheelchair, by [facility] staff.

## 2017-12-13 NOTE — Progress Notes (Signed)
Patient resting comfortably during shift report. Denies complaints.  

## 2017-12-13 NOTE — Progress Notes (Signed)
Call placed to [facility] to notify of telemetry monitoring d/c.   

## 2017-12-13 NOTE — Progress Notes (Signed)
Pt returns from stress test.

## 2017-12-13 NOTE — Progress Notes (Signed)
Pt gone for stress test 

## 2017-12-13 NOTE — Progress Notes (Addendum)
Progress Note  Patient Name: Tonya Bryant Date of Encounter: 12/13/2017  Primary Cardiologist: Armanda Magicraci Turner, MD   Subjective   Feeling well. No chest pain, sob or palpitations.   Inpatient Medications    Scheduled Meds: . amLODipine  10 mg Oral Daily  . aspirin EC  81 mg Oral Daily  . citalopram  20 mg Oral Daily  . clopidogrel  75 mg Oral Daily  . enoxaparin (LOVENOX) injection  40 mg Subcutaneous Q24H  . isosorbide mononitrate  60 mg Oral Daily  . lisinopril  20 mg Oral Daily  . pantoprazole  40 mg Oral Daily  . regadenoson      . rosuvastatin  40 mg Oral QHS   Continuous Infusions:  PRN Meds: acetaminophen, hydrALAZINE, morphine injection, nitroGLYCERIN, ondansetron (ZOFRAN) IV, zolpidem   Vital Signs    Vitals:   12/13/17 0856 12/13/17 0917 12/13/17 0918 12/13/17 0920  BP: (!) 142/85 (!) 145/90 138/80 124/74  Pulse:      Resp:      Temp:      TempSrc:      SpO2:      Weight:      Height:        Intake/Output Summary (Last 24 hours) at 12/13/2017 0936 Last data filed at 12/13/2017 0800 Gross per 24 hour  Intake 720 ml  Output 2700 ml  Net -1980 ml   Filed Weights   12/11/17 2236 12/12/17 0631 12/13/17 0521  Weight: 198 lb 12.8 oz (90.2 kg) 197 lb (89.4 kg) 199 lb 11.2 oz (90.6 kg)    Telemetry    Unable to review as patient seen in nuc med  ECG    NSR - Personally Reviewed  Physical Exam   GEN: No acute distress.   Neck: No JVD Cardiac: RRR, no murmurs, rubs, or gallops.  Respiratory: Clear to auscultation bilaterally. GI: Soft, nontender, non-distended  MS: No edema; No deformity. Neuro:  Nonfocal  Psych: Normal affect   Labs    Chemistry Recent Labs  Lab 12/11/17 1831  NA 137  K 3.7  CL 107  CO2 19*  GLUCOSE 92  BUN 14  CREATININE 0.74  CALCIUM 9.2  GFRNONAA >60  GFRAA >60  ANIONGAP 11     Hematology Recent Labs  Lab 12/11/17 1831  WBC 7.7  RBC 4.06  HGB 12.6  HCT 38.1  MCV 93.8  MCH 31.0  MCHC 33.1  RDW  13.0  PLT 271    Cardiac Enzymes Recent Labs  Lab 12/11/17 2119 12/12/17 0455 12/12/17 0950  TROPONINI <0.03 <0.03 <0.03    Recent Labs  Lab 12/11/17 1444 12/11/17 1847  TROPIPOC 0.00 0.00      Radiology    Dg Chest 2 View  Result Date: 12/11/2017 CLINICAL DATA:  EMS stated, she's having chest pain but no now. Pt at work started having chest pain. Took a nitro 15 min later pain was gone. Hx of CAD, HTN EXAM: CHEST - 2 VIEW COMPARISON:  11/23/2015 FINDINGS: The heart size and mediastinal contours are within normal limits. Both lungs are clear. The visualized skeletal structures are unremarkable. IMPRESSION: No active cardiopulmonary disease. Electronically Signed   By: Tonya Bryant  Tonya Bryant M.D.   On: 12/11/2017 14:43    Cardiac Studies   Pending nuc  Echo 12/12/17 Study Conclusions  - Left ventricle: The cavity size was normal. Wall thickness was   increased in a pattern of moderate LVH. Systolic function was   normal. The estimated ejection  fraction was in the range of 60%   to 65%. Wall motion was normal; there were no regional wall   motion abnormalities. Doppler parameters are consistent with   abnormal left ventricular relaxation (grade 1 diastolic   dysfunction). - Aortic valve: Valve area (VTI): 1.48 cm^2. Valve area (Vmax): 1.5   cm^2. Valve area (Vmean): 1.46 cm^2. - Atrial septum: No defect or patent foramen ovale was identified.  Patient Profile     Tonya Bryant is a 67 y.o. female with a hx of CAD with DES to LCX and LAD in 10/2015 with residual 65% diag disease, chronic angina, GERD, HTN, HL who is seen for the evaluation of chest pain at the request of Dr Barrie Folk.  Assessment & Plan    1. Chest pain with known hx of CAD s/p prior stenting as above - EKG without acute findings. Troponin have been negative. Echo showed normal LVEF with grade 1 DD. Pending final result of stress test.   For questions or updates, please contact [facility] Please consult  [email or web address] for contact info under Cardiology/STEMI.      Tonya Pont, PA  12/13/2017, 9:36 AM    Attending note Patient seen and disucssed with PA Bhagat, I agree wit his documentaiton. No objective evidence of ischemia thus far, echo is stable with normal LVEF. Plan for stress test today. If negative, would uptitrate her imdur and discharge home today.   Would need a work note clearing her from work Friday, Sat, Sun then resume without limitation if her stress is normal and discharged today.    Addenum 150pm Normal stress test, discharge today. I have increased her imdur to 90mg  daily  Tonya Rich MD

## 2017-12-13 NOTE — Progress Notes (Signed)
Patient presented for exercise Myoview however previously unable to reach target HR>>> converted to HollandLexiscan. Tolerated procedure well. Pending final stress imaging result.

## 2018-03-24 ENCOUNTER — Other Ambulatory Visit: Payer: Self-pay | Admitting: Family Medicine

## 2018-03-24 DIAGNOSIS — I25118 Atherosclerotic heart disease of native coronary artery with other forms of angina pectoris: Secondary | ICD-10-CM | POA: Diagnosis not present

## 2018-03-24 DIAGNOSIS — Z1231 Encounter for screening mammogram for malignant neoplasm of breast: Secondary | ICD-10-CM

## 2018-03-24 DIAGNOSIS — Z1211 Encounter for screening for malignant neoplasm of colon: Secondary | ICD-10-CM | POA: Diagnosis not present

## 2018-04-19 ENCOUNTER — Ambulatory Visit
Admission: RE | Admit: 2018-04-19 | Discharge: 2018-04-19 | Disposition: A | Discharge status: Home / Self Care | Payer: Medicare HMO | Source: Ambulatory Visit | Attending: Family Medicine | Admitting: Family Medicine

## 2018-04-19 DIAGNOSIS — Z1231 Encounter for screening mammogram for malignant neoplasm of breast: Secondary | ICD-10-CM | POA: Diagnosis not present

## 2018-05-19 DIAGNOSIS — Z1211 Encounter for screening for malignant neoplasm of colon: Secondary | ICD-10-CM | POA: Diagnosis not present

## 2018-05-19 DIAGNOSIS — Z1212 Encounter for screening for malignant neoplasm of rectum: Secondary | ICD-10-CM | POA: Diagnosis not present

## 2018-05-21 DIAGNOSIS — E669 Obesity, unspecified: Secondary | ICD-10-CM | POA: Diagnosis not present

## 2018-05-21 DIAGNOSIS — Z7982 Long term (current) use of aspirin: Secondary | ICD-10-CM | POA: Diagnosis not present

## 2018-05-21 DIAGNOSIS — Z823 Family history of stroke: Secondary | ICD-10-CM | POA: Diagnosis not present

## 2018-05-21 DIAGNOSIS — I2510 Atherosclerotic heart disease of native coronary artery without angina pectoris: Secondary | ICD-10-CM | POA: Diagnosis not present

## 2018-05-21 DIAGNOSIS — I10 Essential (primary) hypertension: Secondary | ICD-10-CM | POA: Diagnosis not present

## 2018-05-21 DIAGNOSIS — R69 Illness, unspecified: Secondary | ICD-10-CM | POA: Diagnosis not present

## 2018-05-21 DIAGNOSIS — E785 Hyperlipidemia, unspecified: Secondary | ICD-10-CM | POA: Diagnosis not present

## 2018-05-21 DIAGNOSIS — Z809 Family history of malignant neoplasm, unspecified: Secondary | ICD-10-CM | POA: Diagnosis not present

## 2018-05-21 DIAGNOSIS — Z6835 Body mass index (BMI) 35.0-35.9, adult: Secondary | ICD-10-CM | POA: Diagnosis not present

## 2018-05-21 DIAGNOSIS — Z8249 Family history of ischemic heart disease and other diseases of the circulatory system: Secondary | ICD-10-CM | POA: Diagnosis not present

## 2018-05-26 ENCOUNTER — Telehealth: Payer: Self-pay

## 2018-05-26 NOTE — Telephone Encounter (Signed)
   [facility] Pre-operative Risk Assessment    Request for surgical clearance:  1. What type of surgery is being performed?  Colonoscopy   2. When is this surgery scheduled?  TBD   3. What type of clearance is required (medical clearance vs. Pharmacy clearance to hold med vs. Both)?  medical  4. Are there any medications that need to be held prior to surgery and how long?    5. Practice name and name of physician performing surgery?  [facility]/ Dr Cristina Gong   6. What is your office phone number [phone]    7.   What is your office fax number [phone]  8.   Anesthesia type (None, local, MAC, general) ?  MAC   Tonya Bryant 05/26/2018, 10:37 AM  _________________________________________________________________   (provider comments below)

## 2018-05-27 NOTE — Telephone Encounter (Signed)
SPOKE WITH PT AND PT SCHEDULED 06-15-18  WITH  L.GERHARDT  AT 11 AM . PT TOLD TO ARRIVE AT 10:45

## 2018-05-27 NOTE — Telephone Encounter (Signed)
   Primary Cardiologist:Traci Turner, MD  Chart reviewed as part of pre-operative protocol coverage. Because of Tonya Bryant's past medical history and time since last visit, he/she will require a follow-up visit in order to better assess preoperative cardiovascular risk.  Pre-op covering staff: - Please schedule appointment and call patient to inform them. - Please contact requesting surgeon's office via preferred method (i.e, phone, fax) to inform them of need for appointment prior to surgery.  Robbie Lis, PA-C  05/27/2018, 3:16 PM

## 2018-06-01 DIAGNOSIS — R197 Diarrhea, unspecified: Secondary | ICD-10-CM | POA: Diagnosis not present

## 2018-06-01 DIAGNOSIS — R0789 Other chest pain: Secondary | ICD-10-CM | POA: Diagnosis not present

## 2018-06-15 ENCOUNTER — Ambulatory Visit: Payer: Medicare HMO | Admitting: Nurse Practitioner

## 2018-06-15 ENCOUNTER — Encounter (INDEPENDENT_AMBULATORY_CARE_PROVIDER_SITE_OTHER): Payer: Self-pay

## 2018-06-15 ENCOUNTER — Encounter: Payer: Self-pay | Admitting: Nurse Practitioner

## 2018-06-15 VITALS — BP 116/66 | HR 68 | Ht 60.0 in | Wt 195.4 lb

## 2018-06-15 DIAGNOSIS — I10 Essential (primary) hypertension: Secondary | ICD-10-CM

## 2018-06-15 MED ORDER — NITROGLYCERIN 0.4 MG SL SUBL: 0.4000 mg | SUBLINGUAL_TABLET | SUBLINGUAL | 5 refills | Status: DC | PRN

## 2018-06-15 NOTE — Progress Notes (Addendum)
CARDIOLOGY OFFICE NOTE  Date:  06/15/2018    Tonya Bryant Date of Birth: 1951/06/09 Medical Record #[id number]  PCP:  Blair Heys, MD  Cardiologist:  Mayford Knife   Chief Complaint  Patient presents with  . Pre-op Exam    Seen for Dr. Mayford Knife.     History of Present Illness: Tonya Bryant is a 67 y.o. female who presents today for a pre op clearance visit. Seen for Dr. Mayford Knife.   She has a history of CADs/pdrug-eluting stents in the mid circumflex and distal LAD on 11/01/15. Cath showed residual 65% first diagonal stenosisas well.  She is on long acting nitrates for chronic angina.  She remains on chronic DAPT as well. Nuclear stress test showed no ischemia 09/2016 that was done for CP.  She has GERD and her Protonix was increased to BID but had increased fatigue and went back to daily.    She was last seen a year ago by Dr. Mayford Knife and was doing well.  In the ER back in April with chest pain - had repeat echo and nuclear stress test which were stable.    Comes in today. Here alone. Needing colonoscopy. Stool cards + for blood - she was not aware of this. Has not noted any active bleeding. She has been intentionally losing weight thru diet changes. Has had some cramps with diarrhea about a week ago - this is now resolved. She notes that her heart is ok as long as she does "not rush" - then she will get short of breath. This has been her typically pattern. No further chest pain. She does remain on DAPT therapy.   Past Medical History:  Diagnosis Date  . Anxiety   . CAD (coronary artery disease), native coronary artery 11/02/2015   Ost RCA lesion, 30% stenosed, Prox LAD lesion, 50% stenosed, 1st Diag lesion, 65% stenosed, Ramus lesion, 50% stenosed, Mid Cx lesion, 80% stenosed, Dist LAD lesion, 75% stenosed. S/P DES to distal LAD and mid Cx 10/2015  . Depression (emotion)   . Dyslipidemia, goal LDL below 70 02/07/2016  . Full dentures   . Hypertension     Past Surgical  History:  Procedure Laterality Date  . BREAST EXCISIONAL BIOPSY Right   . BREAST SURGERY     lt br bx-neg  . CARDIAC CATHETERIZATION N/A 11/01/2015   Procedure: Left Heart Cath and Coronary Angiography;  Surgeon: Tonny Bollman, MD;  Location: [facility]VAS[facility] CV [facility];  Service: Cardiovascular;  Laterality: N/A;  . CARPAL TUNNEL RELEASE Left 07/07/2013   Procedure: LEFT CARPAL TUNNEL RELEASE;  Surgeon: Wyn Forster., MD;  Location: [facility];  Service: Orthopedics;  Laterality: Left;  . COLONOSCOPY    . TOE OSTEOTOMY     rt big toe  . TOOTH EXTRACTION    . TRIGGER FINGER RELEASE Left 07/07/2013   Procedure: LEFT THUMB RELEASE A-1 PULLEY;  Surgeon: Wyn Forster., MD;  Location: [facility];  Service: Orthopedics;  Laterality: Left;  . TUBAL LIGATION       Medications: Current Meds  Medication Sig  . amLODipine (NORVASC) 10 MG tablet Take 10 mg by mouth daily.  Marland Kitchen aspirin 81 MG tablet Take 1 tablet (81 mg total) by mouth daily.  . citalopram (CELEXA) 20 MG tablet Take 20 mg by mouth daily.  . citalopram (CELEXA) 40 MG tablet Take 20 mg by mouth daily.  . clopidogrel (PLAVIX) 75 MG tablet TAKE 1 TABLET BY  MOUTH ONCE DAILY  . isosorbide mononitrate (IMDUR) 60 MG 24 hr tablet Take 1.5 tablets (90 mg total) by mouth daily.  Marland Kitchen lisinopril (PRINIVIL,ZESTRIL) 20 MG tablet Take 20 mg by mouth daily.  . nitroGLYCERIN (NITROSTAT) 0.4 MG SL tablet Place 1 tablet (0.4 mg total) under the tongue every 5 (five) minutes as needed for chest pain (for 3 doses only).  . pantoprazole (PROTONIX) 40 MG tablet Take 40 mg by mouth daily. Heart burn  . rosuvastatin (CRESTOR) 40 MG tablet Take 40 mg by mouth at bedtime.     Allergies: No Known Allergies  Social History: The patient  reports that she has never smoked. She has never used smokeless tobacco. She reports that she drinks about 1.0 standard drinks of alcohol per week. She reports that she does not use drugs.     Family History: The patient's family history includes Cancer in her mother; Diabetes in her father and mother; Gout in her father; Hypertension in her father and mother; Stroke in her father and mother.   Review of Systems: Please see the history of present illness.   Otherwise, the review of systems is positive for none.   All other systems are reviewed and negative.   Physical Exam: VS:  BP 116/66 (BP Location: Left Arm, Patient Position: Sitting, Cuff Size: Large)   Pulse 68   Ht 5' (1.524 m)   Wt 195 lb 6.4 oz (88.6 kg)   BMI 38.16 kg/m  .  BMI Body mass index is 38.16 kg/m.  Wt Readings from Last 3 Encounters:  06/15/18 195 lb 6.4 oz (88.6 kg)  12/13/17 199 lb 11.2 oz (90.6 kg)  07/13/17 209 lb 3.2 oz (94.9 kg)    General: Pleasant. Well developed, well nourished and in no acute distress. She has lost 14 pounds over the past year.  HEENT: Normal.  Neck: Supple, no JVD, carotid bruits, or masses noted.  Cardiac: Regular rate and rhythm. No murmurs, rubs, or gallops. No edema.  Respiratory:  Lungs are clear to auscultation bilaterally with normal work of breathing.  GI: Soft and nontender.  MS: No deformity or atrophy. Gait and ROM intact.  Skin: Warm and dry. Color is normal.  Neuro:  Strength and sensation are intact and no gross focal deficits noted.  Psych: Alert, appropriate and with normal affect.   LABORATORY DATA:  EKG:  EKG is ordered today. This demonstrates NSR.  Lab Results  Component Value Date   WBC 7.7 12/11/2017   HGB 12.6 12/11/2017   HCT 38.1 12/11/2017   PLT 271 12/11/2017   GLUCOSE 92 12/11/2017   CHOL 116 12/12/2017   TRIG 54 12/12/2017   HDL 58 12/12/2017   LDLCALC 47 12/12/2017   ALT 16 07/20/2017   AST 14 07/20/2017   NA 137 12/11/2017   K 3.7 12/11/2017   CL 107 12/11/2017   CREATININE 0.74 12/11/2017   BUN 14 12/11/2017   CO2 19 (L) 12/11/2017   TSH 1.803 11/21/2015   INR 1.04 11/01/2015   HGBA1C 5.5 12/12/2017     BNP (last  3 results) No results for input(s): BNP in the last 8760 hours.  ProBNP (last 3 results) No results for input(s): PROBNP in the last 8760 hours.   Other Studies Reviewed Today:  Myoview Study Result 11/2017    There was no ST segment deviation noted during stress.  No T wave inversion was noted during stress.  The study is normal.  This is a low risk  study.  The left ventricular ejection fraction is hyperdynamic (>65%).   Normal resting and stress perfusion. No ischemia or infarction EF 67%      Echo Study Conclusions 11/2017  - Left ventricle: The cavity size was normal. Wall thickness was   increased in a pattern of moderate LVH. Systolic function was   normal. The estimated ejection fraction was in the range of 60%   to 65%. Wall motion was normal; there were no regional wall   motion abnormalities. Doppler parameters are consistent with   abnormal left ventricular relaxation (grade 1 diastolic   dysfunction). - Aortic valve: Valve area (VTI): 1.48 cm^2. Valve area (Vmax): 1.5   cm^2. Valve area (Vmean): 1.46 cm^2. - Atrial septum: No defect or patent foramen ovale was identified.  -------------------------------------------------------------------   Assessment/Plan:  1. Pre op clearance - needing colonoscopy due to + stool test. Also with some Gi issues as well. She should be an acceptable candidate. She has had recent stress testing 6 months ago. Her symptoms are stable. Will need to discuss with Dr. Mayford Knife how long to hold DAPT. Further disposition to follow.   Addendum: Per Dr. Mayford Knife - she is to hold aspirin and Plavix for 1 week prior to procedure.   2. CAD - she has had DES to the [facility] and distal LAD from 2017 with known residual disease in a 1st diagonal. Nuclear stress test low risk from April. No NTG use. Remains on DAPT.   3. HTN -BP ok on current regimen.   4. HLD - labs from [facility] noted.   5. Heme + stool - needing colonoscopy. May need long  term use of DAPT addressed going forward.   Current medicines are reviewed with the patient today.  The patient does not have concerns regarding medicines other than what has been noted above.  The following changes have been made:  See above.  Labs/ tests ordered today include:    Orders Placed This Encounter  Procedures  . EKG 12-Lead     Disposition:   FU with Dr. Mayford Knife in a few months.    Patient is agreeable to this plan and will call if any problems develop in the interim.   SignedNorma Fredrickson, NP  06/15/2018 11:46 AM  [facility] [address] [place], [place]  [address] Phone: [phone] Fax: [phone]

## 2018-06-15 NOTE — Patient Instructions (Signed)
We will be checking the following labs today - NONE     Medication Instructions:    Continue with your current medicines.    If you need a refill on your cardiac medications before your next appointment, please call your pharmacy.     Testing/Procedures To Be Arranged:  N/A  Follow-Up:   See Dr. Mayford Knife in December/January    At [facility], you and your health needs are our priority.  As part of our continuing mission to provide you with exceptional heart care, we have created designated Provider Care Teams.  These Care Teams include your primary Cardiologist (physician) and Advanced Practice Providers (APPs -  Physician Assistants and Nurse Practitioners) who all work together to provide you with the care you need, when you need it.  Special Instructions:  . I will speak with Dr. Mayford Knife about how long to hold your aspirin and Plavix.  . I will then send a note to Dr. Matthias Hughs for your clearance.   Call the [facility] office at [phone] if you have any questions, problems or concerns.

## 2018-07-14 DIAGNOSIS — R195 Other fecal abnormalities: Secondary | ICD-10-CM | POA: Diagnosis not present

## 2018-08-10 ENCOUNTER — Other Ambulatory Visit: Payer: Self-pay | Admitting: Cardiology

## 2018-08-19 ENCOUNTER — Emergency Department (HOSPITAL_COMMUNITY)
Admission: EM | Admit: 2018-08-19 | Discharge: 2018-08-20 | Disposition: A | Discharge status: Home / Self Care | Payer: Medicare HMO | Attending: Emergency Medicine | Admitting: Emergency Medicine

## 2018-08-19 ENCOUNTER — Emergency Department (HOSPITAL_COMMUNITY): Payer: Medicare HMO

## 2018-08-19 ENCOUNTER — Other Ambulatory Visit: Payer: Self-pay

## 2018-08-19 ENCOUNTER — Encounter (HOSPITAL_COMMUNITY): Payer: Self-pay

## 2018-08-19 DIAGNOSIS — Z79899 Other long term (current) drug therapy: Secondary | ICD-10-CM | POA: Insufficient documentation

## 2018-08-19 DIAGNOSIS — R1013 Epigastric pain: Secondary | ICD-10-CM | POA: Insufficient documentation

## 2018-08-19 DIAGNOSIS — R531 Weakness: Secondary | ICD-10-CM | POA: Insufficient documentation

## 2018-08-19 DIAGNOSIS — I10 Essential (primary) hypertension: Secondary | ICD-10-CM | POA: Insufficient documentation

## 2018-08-19 DIAGNOSIS — R42 Dizziness and giddiness: Secondary | ICD-10-CM | POA: Insufficient documentation

## 2018-08-19 DIAGNOSIS — M549 Dorsalgia, unspecified: Secondary | ICD-10-CM | POA: Diagnosis not present

## 2018-08-19 DIAGNOSIS — I2510 Atherosclerotic heart disease of native coronary artery without angina pectoris: Secondary | ICD-10-CM | POA: Insufficient documentation

## 2018-08-19 DIAGNOSIS — R0789 Other chest pain: Secondary | ICD-10-CM | POA: Diagnosis not present

## 2018-08-19 DIAGNOSIS — R079 Chest pain, unspecified: Secondary | ICD-10-CM | POA: Diagnosis not present

## 2018-08-19 LAB — BASIC METABOLIC PANEL
Anion gap: 5 (ref 5–15)
BUN: 13 mg/dL (ref 8–23)
CO2: 27 mmol/L (ref 22–32)
Calcium: 8.7 mg/dL — ABNORMAL LOW (ref 8.9–10.3)
Chloride: 109 mmol/L (ref 98–111)
Creatinine, Ser: 0.86 mg/dL (ref 0.44–1.00)
GFR calc Af Amer: >60 mL/min (ref >60)
GFR calc non Af Amer: >60 mL/min (ref >60)
Glucose, Bld: 98 mg/dL (ref 70–99)
Potassium: 3.7 mmol/L (ref 3.5–5.1)
Sodium: 141 mmol/L (ref 135–145)

## 2018-08-19 LAB — CBC
HCT: 36 % (ref 36.0–46.0)
Hemoglobin: 11.7 g/dL — ABNORMAL LOW (ref 12.0–15.0)
MCH: 31.6 pg (ref 26.0–34.0)
MCHC: 32.5 g/dL (ref 30.0–36.0)
MCV: 97.3 fL (ref 80.0–100.0)
Platelets: 258 10*3/uL (ref 150–400)
RBC: 3.7 MIL/uL — ABNORMAL LOW (ref 3.87–5.11)
RDW: 12.8 % (ref 11.5–15.5)
WBC: 7.5 10*3/uL (ref 4.0–10.5)
nRBC: 0 % (ref 0.0–0.2)

## 2018-08-19 LAB — I-STAT TROPONIN, ED: Troponin i, poc: 0 ng/mL (ref 0.00–0.08)

## 2018-08-19 NOTE — ED Triage Notes (Signed)
Pt here for central chest pain and lower back pain since yesterday.  Hx of stent placement.  Followed by Armanda Magicraci Turner Cards MD.  A&OX4 no nausea vomiting or dizziness.

## 2018-08-20 DIAGNOSIS — R42 Dizziness and giddiness: Secondary | ICD-10-CM | POA: Diagnosis not present

## 2018-08-20 DIAGNOSIS — I2510 Atherosclerotic heart disease of native coronary artery without angina pectoris: Secondary | ICD-10-CM | POA: Diagnosis not present

## 2018-08-20 DIAGNOSIS — R0789 Other chest pain: Secondary | ICD-10-CM | POA: Diagnosis not present

## 2018-08-20 DIAGNOSIS — R1013 Epigastric pain: Secondary | ICD-10-CM | POA: Diagnosis not present

## 2018-08-20 DIAGNOSIS — I10 Essential (primary) hypertension: Secondary | ICD-10-CM | POA: Diagnosis not present

## 2018-08-20 DIAGNOSIS — R531 Weakness: Secondary | ICD-10-CM | POA: Diagnosis not present

## 2018-08-20 DIAGNOSIS — Z79899 Other long term (current) drug therapy: Secondary | ICD-10-CM | POA: Diagnosis not present

## 2018-08-20 LAB — LIPASE, BLOOD: Lipase: 24 U/L (ref 11–51)

## 2018-08-20 LAB — URINALYSIS, ROUTINE W REFLEX MICROSCOPIC
Bacteria, UA: NONE SEEN
Bilirubin Urine: NEGATIVE
Glucose, UA: NEGATIVE mg/dL
Ketones, ur: NEGATIVE mg/dL
Leukocytes, UA: NEGATIVE
Nitrite: NEGATIVE
Protein, ur: NEGATIVE mg/dL
Specific Gravity, Urine: 1.012 (ref 1.005–1.030)
pH: 7 (ref 5.0–8.0)

## 2018-08-20 LAB — I-STAT TROPONIN, ED: Troponin i, poc: 0 ng/mL (ref 0.00–0.08)

## 2018-08-20 LAB — HEPATIC FUNCTION PANEL
ALT: 24 U/L (ref 0–44)
AST: 21 U/L (ref 15–41)
Albumin: 3.6 g/dL (ref 3.5–5.0)
Alkaline Phosphatase: 48 U/L (ref 38–126)
BILIRUBIN TOTAL: 0.3 mg/dL (ref 0.3–1.2)
Total Protein: 6.7 g/dL (ref 6.5–8.1)

## 2018-08-20 MED ORDER — LIDOCAINE VISCOUS HCL 2 % MT SOLN
15.0000 mL | Freq: Once | OROMUCOSAL | Status: AC
  Administered 2018-08-20: 15 mL via ORAL
  Filled 2018-08-20: qty 15

## 2018-08-20 MED ORDER — ALUM & MAG HYDROXIDE-SIMETH 200-200-20 MG/5ML PO SUSP
30.0000 mL | Freq: Once | ORAL | Status: AC
  Administered 2018-08-20: 30 mL via ORAL
  Filled 2018-08-20: qty 30

## 2018-08-20 MED ORDER — SODIUM CHLORIDE 0.9 % IV BOLUS
500.0000 mL | Freq: Once | INTRAVENOUS | Status: AC
  Administered 2018-08-20: 500 mL via INTRAVENOUS

## 2018-08-20 MED ORDER — FAMOTIDINE 20 MG PO TABS: 20.0000 mg | ORAL_TABLET | Freq: Two times a day (BID) | ORAL | 0 refills | Status: DC

## 2018-08-20 MED ORDER — SUCRALFATE 1 G PO TABS: 1.0000 g | ORAL_TABLET | Freq: Three times a day (TID) | ORAL | 0 refills | Status: DC

## 2018-08-20 NOTE — ED Notes (Signed)
Pt got dizzy upon standing for orthostatic vitals.

## 2018-08-20 NOTE — ED Provider Notes (Signed)
[facility] Provider Note   CSN: [id number] Arrival date & time: 08/19/18  1916     History   Chief Complaint Chief Complaint  Patient presents with  . Chest Pain    HPI Tonya Bryant is a 67 y.o. female.  HPI 67 year old female with history of coronary disease, hypertension, chronic bradycardia, here with multiple complaints.  Patient's primary complaint is a constant, aching, dull, epigastric pain.  This is been constant and present since yesterday.  She has had a history of similar pain due to coronary disease, though has recently had a negative stress test within the last year.  She reports she occasionally has a sharp pain with this, when she moves.  No nausea or vomiting.  No shortness of breath.  No diaphoresis.  She also notes generalized fatigue.  She is had some chronic lower back pain that seems to be worse over the last several days, though she is been moving and lifting more than usual.  She also has some mild dizziness upon standing, that resolves when she sits down.  Denies any recent medication changes.  No focal numbness or weakness.  She told her family about the pain and was convinced to come here for further evaluation.   Past Medical History:  Diagnosis Date  . Anxiety   . CAD (coronary artery disease), native coronary artery 11/02/2015   Ost RCA lesion, 30% stenosed, Prox LAD lesion, 50% stenosed, 1st Diag lesion, 65% stenosed, Ramus lesion, 50% stenosed, Mid Cx lesion, 80% stenosed, Dist LAD lesion, 75% stenosed. S/P DES to distal LAD and mid Cx 10/2015  . Depression (emotion)   . Dyslipidemia, goal LDL below 70 02/07/2016  . Full dentures   . Hypertension     Patient Active Problem List   Diagnosis Date Noted  . GERD (gastroesophageal reflux disease) 12/11/2017  . Depression 12/11/2017  . Edema extremities 07/13/2017  . Dyslipidemia, goal LDL below 70 02/07/2016  . CAD (coronary artery disease), native coronary  artery 11/02/2015  . Chest pain 10/31/2015  . HTN (hypertension) 10/31/2015  . Bradycardia 10/31/2015  . Obesity 10/31/2015  . Ingrown right greater toenail 04/27/2013    Past Surgical History:  Procedure Laterality Date  . BREAST EXCISIONAL BIOPSY Right   . BREAST SURGERY     lt br bx-neg  . CARDIAC CATHETERIZATION N/A 11/01/2015   Procedure: Left Heart Cath and Coronary Angiography;  Surgeon: Tonny BollmanMichael Cooper, MD;  [facility];  Service: Cardiovascular;  Laterality: N/A;  . CARPAL TUNNEL RELEASE Left 07/07/2013   Procedure: LEFT CARPAL TUNNEL RELEASE;  Surgeon: Wyn Forsterobert V Sypher Jr., MD;  Location: [facility];  Service: Orthopedics;  Laterality: Left;  . COLONOSCOPY    . TOE OSTEOTOMY     rt big toe  . TOOTH EXTRACTION    . TRIGGER FINGER RELEASE Left 07/07/2013   Procedure: LEFT THUMB RELEASE A-1 PULLEY;  Surgeon: Wyn Forsterobert V Sypher Jr., MD;  Location: [facility];  Service: Orthopedics;  Laterality: Left;  . TUBAL LIGATION       OB History   No obstetric history on file.      Home Medications    Prior to Admission medications   Medication Sig Start Date End Date Taking? Authorizing Provider  amLODipine (NORVASC) 10 MG tablet Take 10 mg by mouth daily.   Yes [provider]  aspirin 81 MG tablet Take 1 tablet (81 mg total) by mouth daily. 11/02/15  Yes Ghimire, Werner Lean, MD  citalopram (CELEXA) 40 MG tablet Take 20 mg by mouth daily.   Yes [provider]  clopidogrel (PLAVIX) 75 MG tablet TAKE 1 TABLET BY MOUTH ONCE DAILY 08/10/18  Yes Turner, Cornelious Bryant, MD  isosorbide mononitrate (IMDUR) 60 MG 24 hr tablet Take 1.5 tablets (90 mg total) by mouth daily. 12/13/17  Yes Duke, Roe Rutherford, PA  lisinopril (PRINIVIL,ZESTRIL) 20 MG tablet Take 20 mg by mouth daily.   Yes [provider]  nitroGLYCERIN (NITROSTAT) 0.4 MG SL tablet Place 1 tablet (0.4 mg total) under the tongue every 5 (five) minutes as needed for  chest pain (for 3 doses only). 06/15/18  Yes Rosalio Macadamia, NP  pantoprazole (PROTONIX) 40 MG tablet Take 40 mg by mouth daily. Heart burn   Yes [provider]  rosuvastatin (CRESTOR) 40 MG tablet Take 40 mg by mouth at bedtime.   Yes [provider]  famotidine (PEPCID) 20 MG tablet Take 1 tablet (20 mg total) by mouth 2 (two) times daily for 5 days. 08/20/18 08/25/18  Shaune Pollack, MD  sucralfate (CARAFATE) 1 g tablet Take 1 tablet (1 g total) by mouth 4 (four) times daily -  with meals and at bedtime for 5 days. 08/20/18 08/25/18  Shaune Pollack, MD    Family History Family History  Problem Relation Age of Onset  . Cancer Mother   . Diabetes Mother   . Stroke Mother   . Hypertension Mother   . Stroke Father   . Diabetes Father   . Hypertension Father   . Gout Father     Social History Social History   Tobacco Use  . Smoking status: Never Smoker  . Smokeless tobacco: Never Used  Substance Use Topics  . Alcohol use: Yes    Alcohol/week: 1.0 standard drinks    Types: 1 Standard drinks or equivalent per week  . Drug use: No     Allergies   Patient has no known allergies.   Review of Systems Review of Systems  Constitutional: Positive for fatigue. Negative for chills and fever.  HENT: Negative for congestion and rhinorrhea.   Eyes: Negative for visual disturbance.  Respiratory: Positive for chest tightness. Negative for cough, shortness of breath and wheezing.   Cardiovascular: Positive for chest pain. Negative for leg swelling.  Gastrointestinal: Negative for abdominal pain, diarrhea, nausea and vomiting.  Genitourinary: Negative for dysuria and flank pain.  Musculoskeletal: Positive for back pain. Negative for neck pain and neck stiffness.  Skin: Negative for rash and wound.  Allergic/Immunologic: Negative for immunocompromised state.  Neurological: Positive for dizziness and weakness. Negative for syncope and headaches.  All other systems  reviewed and are negative.    Physical Exam Updated Vital Signs BP 134/77   Pulse (!) 57   Temp 98.1 F (36.7 C) (Oral)   Resp 19   SpO2 95%   Physical Exam Vitals signs and nursing note reviewed.  Constitutional:      General: She is not in acute distress.    Appearance: She is well-developed.  HENT:     Head: Normocephalic and atraumatic.  Eyes:     Conjunctiva/sclera: Conjunctivae normal.  Neck:     Musculoskeletal: Neck supple.  Cardiovascular:     Rate and Rhythm: Normal rate and regular rhythm.     Heart sounds: Normal heart sounds. No murmur. No friction rub.  Pulmonary:     Effort: Pulmonary effort is normal. No respiratory distress.  Breath sounds: Normal breath sounds. No wheezing or rales.  Abdominal:     General: Bowel sounds are normal. There is no distension.     Palpations: Abdomen is soft.     Tenderness: There is no abdominal tenderness.  Musculoskeletal:     Right lower leg: No edema.     Left lower leg: No edema.  Skin:    General: Skin is warm.     Capillary Refill: Capillary refill takes less than 2 seconds.  Neurological:     Mental Status: She is alert and oriented to person, place, and time.     Motor: No abnormal muscle tone.      Neurological Exam:  Mental Status: Alert and oriented to person, place, and time. Attention and concentration normal. Speech clear. Recent memory is intact. Cranial Nerves: Visual fields grossly intact. EOMI and PERRLA. No nystagmus noted. Facial sensation intact at forehead, maxillary cheek, and chin/mandible bilaterally. No facial asymmetry or weakness. Hearing grossly normal. Uvula is midline, and palate elevates symmetrically. Normal SCM and trapezius strength. Tongue midline without fasciculations. Motor: Muscle strength 5/5 in proximal and distal UE and LE bilaterally. No pronator drift. Muscle tone normal. Reflexes: 2+ and symmetrical in all four extremities.  Sensation: Intact to light touch in upper  and lower extremities distally bilaterally.  Gait: Normal without ataxia. Coordination: Normal FTN bilaterally.     ED Treatments / Results  Labs (all labs ordered are listed, but only abnormal results are displayed) Labs Reviewed  BASIC METABOLIC PANEL - Abnormal; Notable for the following components:      Result Value   Calcium 8.7 (*)    All other components within normal limits  CBC - Abnormal; Notable for the following components:   RBC 3.70 (*)    Hemoglobin 11.7 (*)    All other components within normal limits  URINALYSIS, ROUTINE W REFLEX MICROSCOPIC - Abnormal; Notable for the following components:   Color, Urine STRAW (*)    Hgb urine dipstick SMALL (*)    All other components within normal limits  HEPATIC FUNCTION PANEL  LIPASE, BLOOD  I-STAT TROPONIN, ED  I-STAT TROPONIN, ED    EKG EKG Interpretation  Date/Time:  Thursday August 19 2018 19:21:52 EST Ventricular Rate:  67 PR Interval:  194 QRS Duration: 78 QT Interval:  406 QTC Calculation: 429 R Axis:   13 Text Interpretation:  Normal sinus rhythm Normal ECG When compared with ECG of 12/12/2017, No significant change was found Confirmed by Dione Booze ([id number]) on 08/19/2018 11:53:08 PM   Radiology Dg Chest 2 View  Result Date: 08/19/2018 CLINICAL DATA:  Chest pain radiating to the shoulders, back and neck today. EXAM: CHEST - 2 VIEW COMPARISON:  12/11/2017 FINDINGS: The heart size and mediastinal contours are within normal limits. Both lungs are clear. The visualized skeletal structures are unremarkable. IMPRESSION: No active cardiopulmonary disease. Electronically Signed   By: Tollie Eth M.D.   On: 08/19/2018 20:24    Procedures Procedures (including critical care time)  Medications Ordered in ED Medications  sodium chloride 0.9 % bolus 500 mL (500 mLs Intravenous New Bag/Given 08/20/18 0321)  sodium chloride 0.9 % bolus 500 mL (0 mLs Intravenous Stopped 08/20/18 0319)  alum & mag  hydroxide-simeth (MAALOX/MYLANTA) 200-200-20 MG/5ML suspension 30 mL (30 mLs Oral Given 08/20/18 0311)    And  lidocaine (XYLOCAINE) 2 % viscous mouth solution 15 mL (15 mLs Oral Given 08/20/18 0311)     Initial Impression / Assessment and Plan /  ED Course  I have reviewed the triage vital signs and the nursing notes.  Pertinent labs & imaging results that were available during my care of the patient were reviewed by me and considered in my medical decision making (see chart for details).     67 yo F here with multiple complaints.  Primary c/o chest pain - h/o CAD. Pt has had recent stress that was negative within past year. Low suspicion for coronary disease/ischemia at this time. EKG non-ischemic, trop neg x 2 despite constant sx >12 hr, with neg stress recently. Pain is not sharp, tearing, and has h/o similar pain - doubt dissection. No cough, tachypnea, tachycardia, or s/s PE/DVT. Discussed that given her CAD, would recommend admission for CP rule out but pt refuses. She fully understands risks/benefits and would prefer to f/u as outpt, based on shared decision making.  Dizziness upon standing - symptomatically orthostatic. Admit to poor PO intake, dietary changes 2/2 holiday. I suspect this is also contributing to her acute on chronic back pain 2/2 increased physical actiivty. Improved w/ fluids. Her back pain is constant, chronic, and paraspinal - no abd pain, no pulsatile masses, BP control, doubt AAA or dissection. UA with microscopic hematuria which has been noted previously - no signs of pyelo, stone clinically. F/u as outpt for these chronic issues.  Again discussed and recommended admission with pt, family who prefer outpt management. Low concern for acute ischemia as above. Sx improved markeldy with GI cocktail and I suspect this could be primary etiology for her pain. D/c with antacids.  Final Clinical Impressions(s) / ED Diagnoses   Final diagnoses:  Atypical chest pain     ED Discharge Orders         Ordered    sucralfate (CARAFATE) 1 g tablet  3 times daily with meals & bedtime     08/20/18 0408    famotidine (PEPCID) 20 MG tablet  2 times daily     08/20/18 0408           Shaune PollackIsaacs, Israa Caban, MD 08/20/18 [phone]

## 2018-08-20 NOTE — ED Notes (Signed)
Patient verbalizes understanding of discharge instructions. Opportunity for questioning and answers were provided. Armband removed by staff, pt discharged from ED by wheelchair   

## 2018-08-20 NOTE — Discharge Instructions (Addendum)
We will try two antacids to control your symptoms  Call Dr. Mayford Knifeurner and arrange follow-up this week  Drink plenty of fluid, to prevent your dizziness upon standing  Follow-up with your primary doctor early this coming week

## 2018-08-26 DIAGNOSIS — R195 Other fecal abnormalities: Secondary | ICD-10-CM | POA: Diagnosis not present

## 2018-08-26 DIAGNOSIS — K5730 Diverticulosis of large intestine without perforation or abscess without bleeding: Secondary | ICD-10-CM | POA: Diagnosis not present

## 2018-09-14 DIAGNOSIS — M7042 Prepatellar bursitis, left knee: Secondary | ICD-10-CM | POA: Diagnosis not present

## 2018-09-14 DIAGNOSIS — Z23 Encounter for immunization: Secondary | ICD-10-CM | POA: Diagnosis not present

## 2018-09-17 ENCOUNTER — Ambulatory Visit: Payer: Medicare HMO | Admitting: Cardiology

## 2018-10-10 NOTE — Progress Notes (Signed)
Cardiology Office Note:    Date:  10/11/2018   ID:  Tonya Bryant, DOB Jan 09, 1951, MRN [id number]  PCP:  Blair Heys, MD  Cardiologist:  Armanda Magic, MD    Referring MD: Blair Heys, MD   Chief Complaint  Patient presents with  . Coronary Artery Disease  . Hypertension  . Hyperlipidemia    History of Present Illness:    Tonya Bryant is a 68 y.o. female with a hx of CADs/pdrug-eluting stents in the mid circumflex and distal LAD on 11/01/15. Cath showed residual 65% first diagonal stenosisas well.She is on long acting nitrates for chronic angina. She remains on chronic DAPT as well. Nuclear stress test showed no ischemia 09/2016 that was done for CP. She has GERD and her Protonix was increased to BID but had increased fatigue and went back to daily.     She is here today for followup and is doing well.  She denies any chest pain or pressure, SOB, DOE, PND, orthopnea, LE edema, dizziness, palpitations or syncope. She is compliant with her meds and is tolerating meds with no SE.    Past Medical History:  Diagnosis Date  . Anxiety   . CAD (coronary artery disease), native coronary artery 11/02/2015   Ost RCA lesion, 30% stenosed, Prox LAD lesion, 50% stenosed, 1st Diag lesion, 65% stenosed, Ramus lesion, 50% stenosed, Mid Cx lesion, 80% stenosed, Dist LAD lesion, 75% stenosed. S/P DES to distal LAD and mid Cx 10/2015  . Depression (emotion)   . Dyslipidemia, goal LDL below 70 02/07/2016  . Full dentures   . Hypertension     Past Surgical History:  Procedure Laterality Date  . BREAST EXCISIONAL BIOPSY Right   . BREAST SURGERY     lt br bx-neg  . CARDIAC CATHETERIZATION N/A 11/01/2015   Procedure: Left Heart Cath and Coronary Angiography;  Surgeon: Tonny Bollman, MD;  Location: [facility] INVASIVE CV LAB;  Service: Cardiovascular;  Laterality: N/A;  . CARPAL TUNNEL RELEASE Left 07/07/2013   Procedure: LEFT CARPAL TUNNEL RELEASE;  Surgeon: Wyn Forster., MD;  Location:  [facility];  Service: Orthopedics;  Laterality: Left;  . COLONOSCOPY    . TOE OSTEOTOMY     rt big toe  . TOOTH EXTRACTION    . TRIGGER FINGER RELEASE Left 07/07/2013   Procedure: LEFT THUMB RELEASE A-1 PULLEY;  Surgeon: Wyn Forster., MD;  Location: [facility];  Service: Orthopedics;  Laterality: Left;  . TUBAL LIGATION      Current Medications: Current Meds  Medication Sig  . amLODipine (NORVASC) 10 MG tablet Take 10 mg by mouth daily.  Marland Kitchen aspirin 81 MG tablet Take 1 tablet (81 mg total) by mouth daily.  . citalopram (CELEXA) 40 MG tablet Take 20 mg by mouth daily.  . clopidogrel (PLAVIX) 75 MG tablet TAKE 1 TABLET BY MOUTH ONCE DAILY  . isosorbide mononitrate (IMDUR) 60 MG 24 hr tablet Take 1.5 tablets (90 mg total) by mouth daily.  Marland Kitchen lisinopril (PRINIVIL,ZESTRIL) 20 MG tablet Take 20 mg by mouth daily.  . nitroGLYCERIN (NITROSTAT) 0.4 MG SL tablet Place 1 tablet (0.4 mg total) under the tongue every 5 (five) minutes as needed for chest pain (for 3 doses only).  . pantoprazole (PROTONIX) 40 MG tablet Take 40 mg by mouth daily. Heart burn  . rosuvastatin (CRESTOR) 40 MG tablet Take 40 mg by mouth at bedtime.     Allergies:   Patient has no known allergies.  Social History   Socioeconomic History  . Marital status: Single    Spouse name: Not on file  . Number of children: Not on file  . Years of education: Not on file  . Highest education level: Not on file  Occupational History  . Not on file  Social Needs  . Financial resource strain: Not on file  . Food insecurity:    Worry: Not on file    Inability: Not on file  . Transportation needs:    Medical: Not on file    Non-medical: Not on file  Tobacco Use  . Smoking status: Never Smoker  . Smokeless tobacco: Never Used  Substance and Sexual Activity  . Alcohol use: Yes    Alcohol/week: 1.0 standard drinks    Types: 1 Standard drinks or equivalent per week  . Drug use: No  . Sexual  activity: Not on file  Lifestyle  . Physical activity:    Days per week: Not on file    Minutes per session: Not on file  . Stress: Not on file  Relationships  . Social connections:    Talks on phone: Not on file    Gets together: Not on file    Attends religious service: Not on file    Active member of club or organization: Not on file    Attends meetings of clubs or organizations: Not on file    Relationship status: Not on file  Other Topics Concern  . Not on file  Social History Narrative  . Not on file     Family History: The patient's family history includes Cancer in her mother; Diabetes in her father and mother; Gout in her father; Hypertension in her father and mother; Stroke in her father and mother.  ROS:   Please see the history of present illness.    ROS  All other systems reviewed and negative.   EKGs/Labs/Other Studies Reviewed:    The following studies were reviewed today: none  EKG:  EKG is not ordered today.    Recent Labs: 08/19/2018: ALT 24; BUN 13; Creatinine, Ser 0.86; Hemoglobin 11.7; Platelets 258; Potassium 3.7; Sodium 141   Recent Lipid Panel    Component Value Date/Time   CHOL 116 12/12/2017 0455   CHOL 129 07/20/2017 1012   TRIG 54 12/12/2017 0455   HDL 58 12/12/2017 0455   HDL 68 07/20/2017 1012   CHOLHDL 2.0 12/12/2017 0455   VLDL 11 12/12/2017 0455   LDLCALC 47 12/12/2017 0455   LDLCALC 51 07/20/2017 1012    Physical Exam:    VS:  BP (!) 159/82   Pulse 66   Ht 5' (1.524 m)   Wt 196 lb 9.6 oz (89.2 kg)   SpO2 98%   BMI 38.40 kg/m     Wt Readings from Last 3 Encounters:  10/11/18 196 lb 9.6 oz (89.2 kg)  06/15/18 195 lb 6.4 oz (88.6 kg)  12/13/17 199 lb 11.2 oz (90.6 kg)     GEN:  Well nourished, well developed in no acute distress HEENT: Normal NECK: No JVD; No carotid bruits LYMPHATICS: No lymphadenopathy CARDIAC: RRR, no murmurs, rubs, gallops RESPIRATORY:  Clear to auscultation without rales, wheezing or rhonchi    ABDOMEN: Soft, non-tender, non-distended MUSCULOSKELETAL:  No edema; No deformity  SKIN: Warm and dry NEUROLOGIC:  Alert and oriented x 3 PSYCHIATRIC:  Normal affect   ASSESSMENT:    1. Coronary artery disease involving native coronary artery of native heart without angina pectoris  2. Essential hypertension   3. Dyslipidemia, goal LDL below 70    PLAN:    In order of problems listed above:  1.  ASCAD - s/pdrug-eluting stents in the mid circumflex and distal LAD on 11/01/15. Cath showed residual 65% first diagonal stenosisas well. She denies any anginal CP.  Nuclear stress test and echo 2018 were normal.  She was seen in the ER in December for atypical CP and workup was normal.  It was felt that she had GERD.  She has not had any further sx since then and feels it was indigestion.  She wants to hold off on any further testing unless she has recurrent sx.  She will continue on Plavix 75mg  daily, ASA 81mg  daily, statin and Imdur 90mg  daily.    2.  HTN - BP controlled on exam today  She will ocntinue on Amlodipine 10mg  daily and Lisinopril 20mg  daily.  Creatinine 0.86 on 07/2018.    3.  Hyperlipidemia - LDL goal is < 70  Her LDL was 47 a year ago.  I will get an FLp and ALT and she will continue on Crestor 40mg  daily.     Medication Adjustments/Labs and Tests Ordered: Current medicines are reviewed at length with the patient today.  Concerns regarding medicines are outlined above.  No orders of the defined types were placed in this encounter.  No orders of the defined types were placed in this encounter.   Signed, Armanda Magic, MD  10/11/2018 11:33 AM    [facility]

## 2018-10-11 ENCOUNTER — Ambulatory Visit: Payer: Medicare HMO | Admitting: Cardiology

## 2018-10-11 ENCOUNTER — Encounter: Payer: Self-pay | Admitting: Cardiology

## 2018-10-11 ENCOUNTER — Encounter (INDEPENDENT_AMBULATORY_CARE_PROVIDER_SITE_OTHER): Payer: Self-pay

## 2018-10-11 VITALS — BP 159/82 | HR 66 | Ht 60.0 in | Wt 196.6 lb

## 2018-10-11 DIAGNOSIS — E785 Hyperlipidemia, unspecified: Secondary | ICD-10-CM | POA: Diagnosis not present

## 2018-10-11 DIAGNOSIS — I10 Essential (primary) hypertension: Secondary | ICD-10-CM

## 2018-10-11 DIAGNOSIS — I2510 Atherosclerotic heart disease of native coronary artery without angina pectoris: Secondary | ICD-10-CM

## 2018-10-11 NOTE — Patient Instructions (Addendum)
Medication Instructions:  Your physician recommends that you continue on your current medications as directed. Please refer to the Current Medication list given to you today.  If you need a refill on your cardiac medications before your next appointment, please call your pharmacy.   Lab work: Future: Fasting labs on 10/19/18. BMET, LFT and Lipid   If you have labs (blood work) drawn today and your tests are completely normal, you will receive your results only by: Marland Kitchen MyChart Message (if you have MyChart) OR . A paper copy in the mail If you have any lab test that is abnormal or we need to change your treatment, we will call you to review the results.  Testing/Procedures: None  Follow-Up: At [facility], you and your health needs are our priority.  As part of our continuing mission to provide you with exceptional heart care, we have created designated Provider Care Teams.  These Care Teams include your primary Cardiologist (physician) and Advanced Practice Providers (APPs -  Physician Assistants and Nurse Practitioners) who all work together to provide you with the care you need, when you need it. You will need a follow up appointment in 1 years.  Please call our office 2 months in advance to schedule this appointment.  You may see Armanda Magic, MD or one of the following Advanced Practice Providers on your designated Care Team:   [place], PA-C Ronie Spies, PA-C . Jacolyn Reedy, PA-C  Check blood pressure 3 times this week at Pharmacy and call with the results.

## 2018-10-19 ENCOUNTER — Other Ambulatory Visit: Payer: Medicare HMO | Admitting: *Deleted

## 2018-10-19 DIAGNOSIS — E785 Hyperlipidemia, unspecified: Secondary | ICD-10-CM | POA: Diagnosis not present

## 2018-10-19 DIAGNOSIS — I2510 Atherosclerotic heart disease of native coronary artery without angina pectoris: Secondary | ICD-10-CM | POA: Diagnosis not present

## 2018-10-19 LAB — BASIC METABOLIC PANEL
BUN/Creatinine Ratio: 23 (ref 12–28)
BUN: 16 mg/dL (ref 8–27)
CO2: 23 mmol/L (ref 20–29)
Calcium: 9.1 mg/dL (ref 8.7–10.3)
Chloride: 102 mmol/L (ref 96–106)
Creatinine, Ser: 0.7 mg/dL (ref 0.57–1.00)
GFR calc Af Amer: 104 mL/min/{1.73_m2} (ref >59)
GFR calc non Af Amer: 91 mL/min/{1.73_m2} (ref >59)
GLUCOSE: 92 mg/dL (ref 65–99)
Potassium: 4 mmol/L (ref 3.5–5.2)
Sodium: 137 mmol/L (ref 134–144)

## 2018-10-19 LAB — LIPID PANEL
Chol/HDL Ratio: 1.9 ratio (ref 0.0–4.4)
Cholesterol, Total: 148 mg/dL (ref 100–199)
HDL: 77 mg/dL (ref >39)
LDL Calculated: 62 mg/dL (ref 0–99)
Triglycerides: 45 mg/dL (ref 0–149)
VLDL Cholesterol Cal: 9 mg/dL (ref 5–40)

## 2018-10-19 LAB — HEPATIC FUNCTION PANEL
ALT: 15 IU/L (ref 0–32)
AST: 15 IU/L (ref 0–40)
Albumin: 4.3 g/dL (ref 3.8–4.8)
Alkaline Phosphatase: 72 IU/L (ref 39–117)
Bilirubin Total: 0.3 mg/dL (ref 0.0–1.2)
Bilirubin, Direct: 0.13 mg/dL (ref 0.00–0.40)
TOTAL PROTEIN: 7.1 g/dL (ref 6.0–8.5)

## 2018-10-28 DIAGNOSIS — M1712 Unilateral primary osteoarthritis, left knee: Secondary | ICD-10-CM | POA: Diagnosis not present

## 2018-11-14 ENCOUNTER — Other Ambulatory Visit: Payer: Self-pay | Admitting: Cardiology

## 2018-11-27 IMAGING — MG DIGITAL SCREENING BILATERAL MAMMOGRAM WITH CAD
4 series · 4 of 4 positions shown · non-contrast
Comparison: Previous exam(s).

CLINICAL DATA: Screening.

EXAM:
DIGITAL SCREENING BILATERAL MAMMOGRAM WITH CAD

[L MLO]
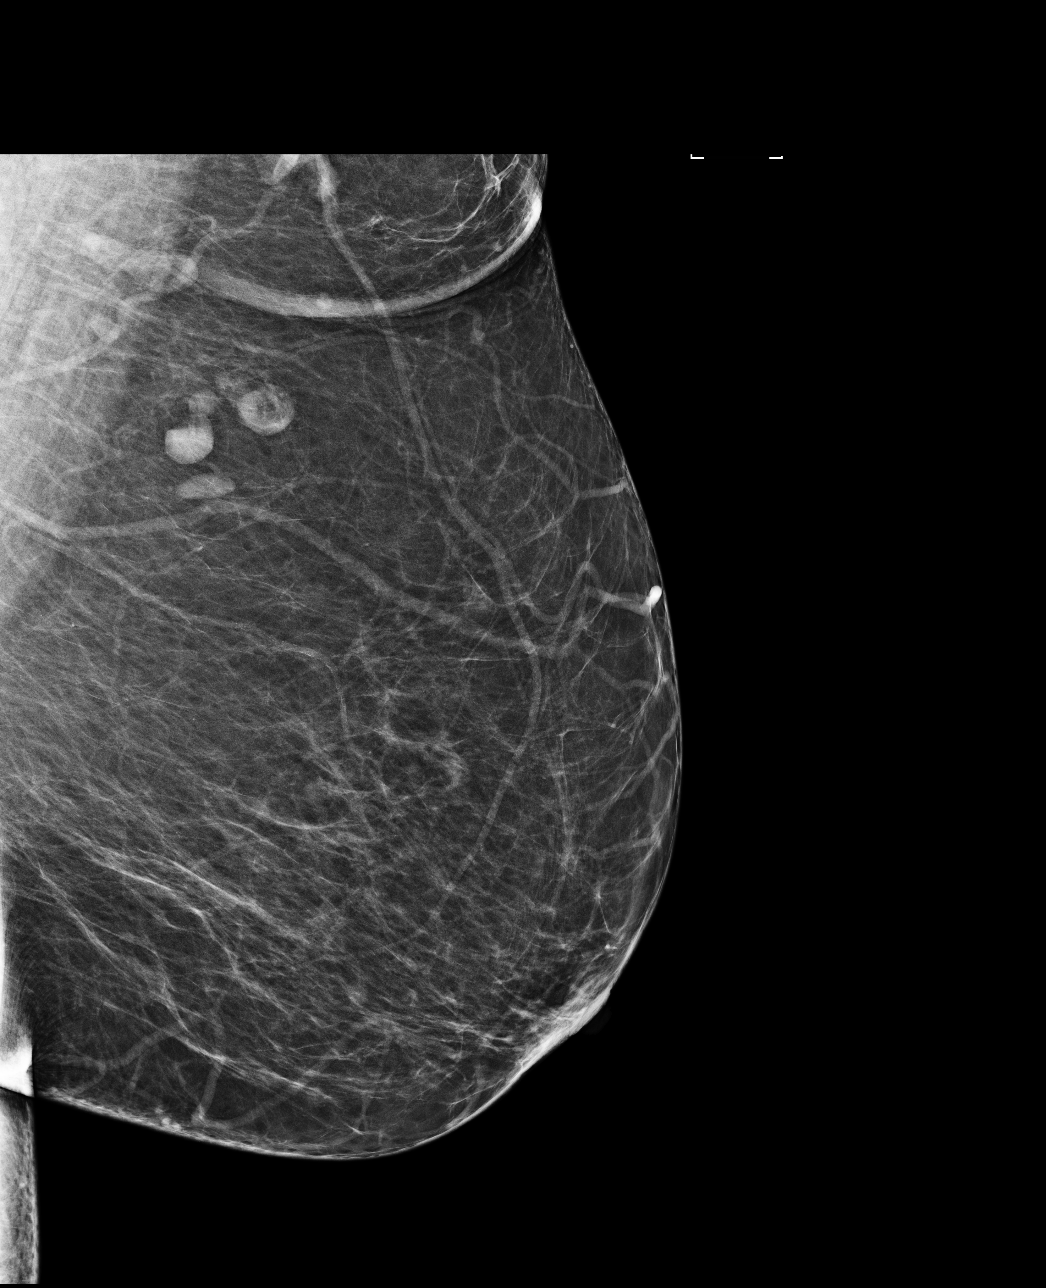

[L CC]
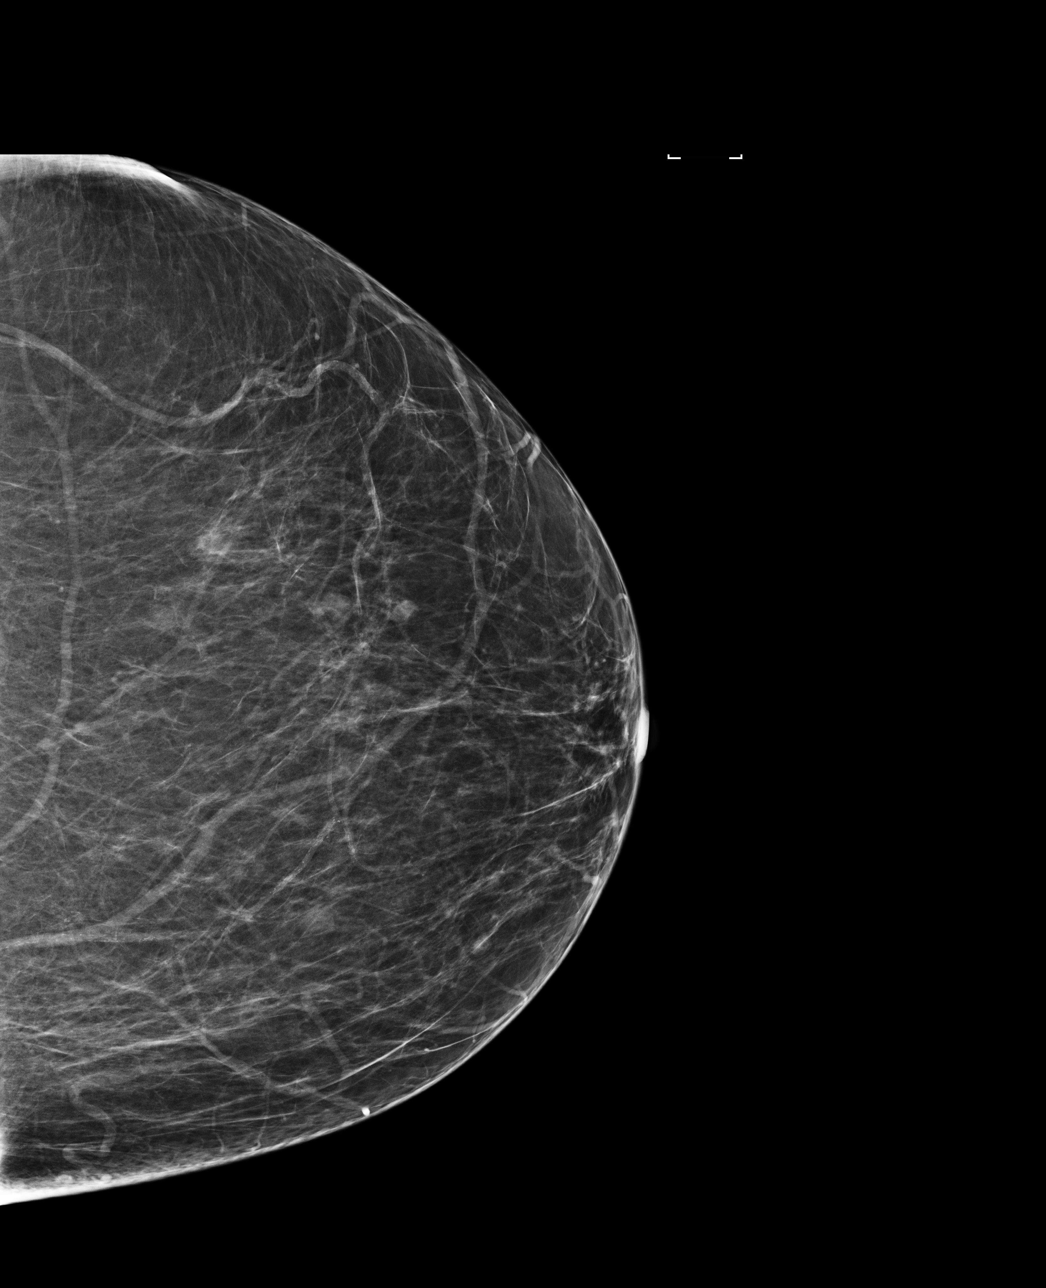

[R CC]
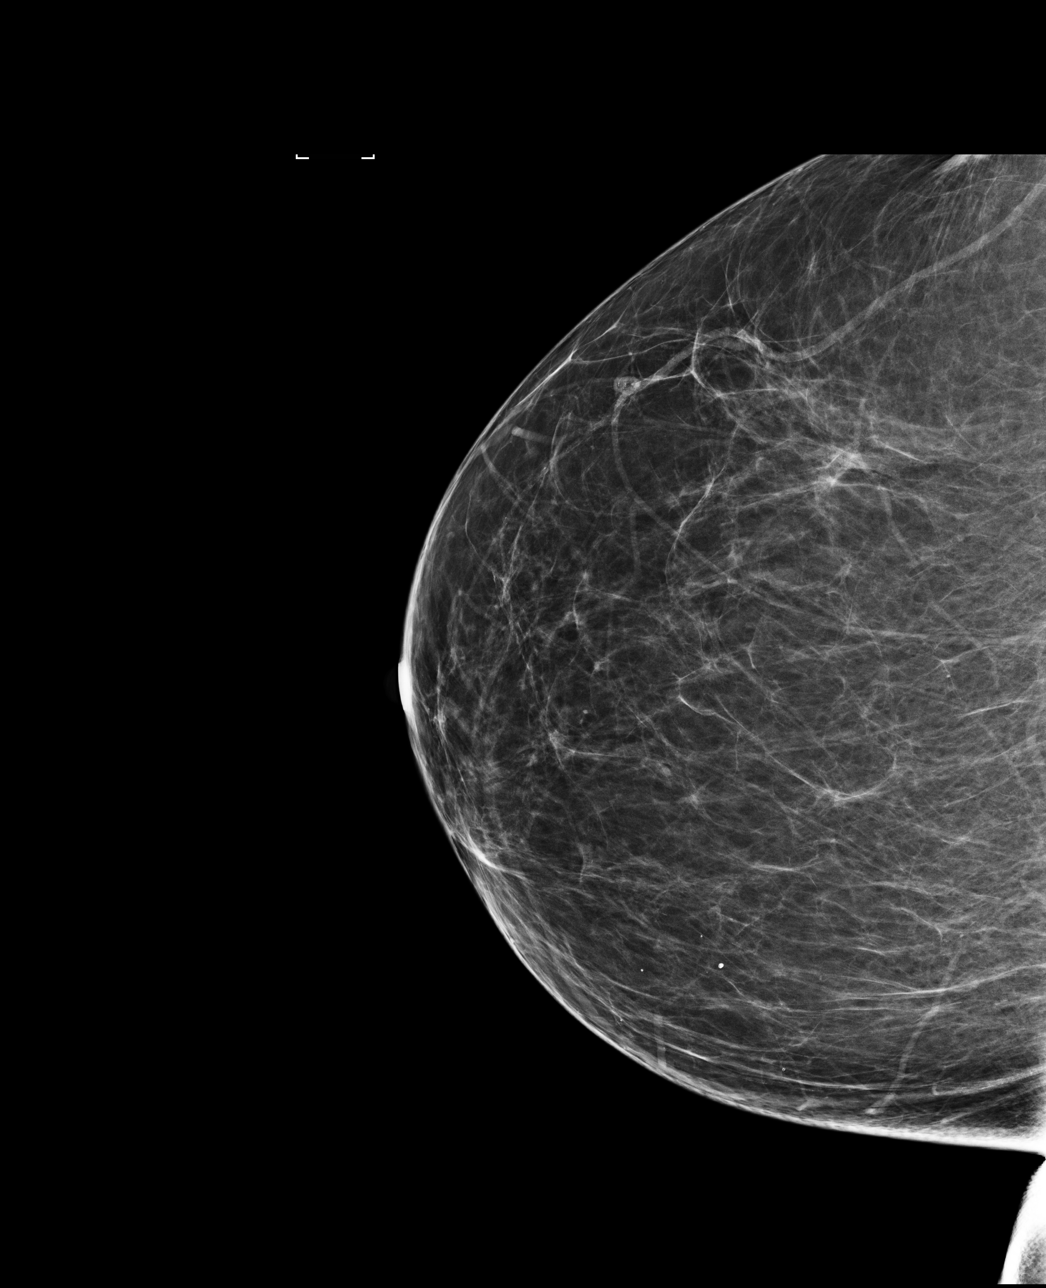

[R MLO]
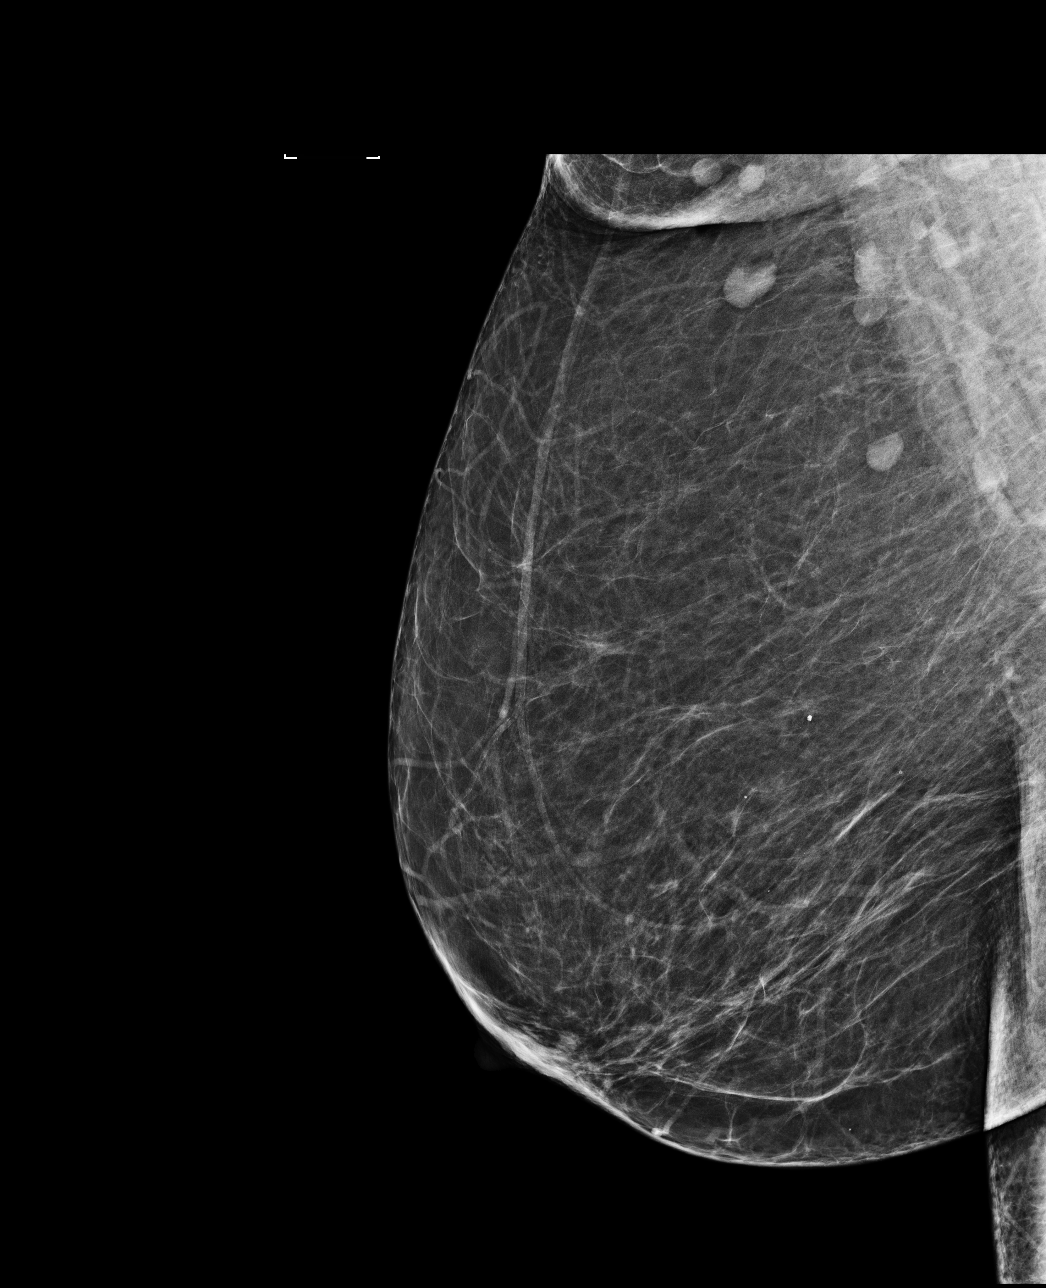

[4 of 4 positions shown; findings below may reference images not displayed]

ACR Breast Density Category b: There are scattered areas of
fibroglandular density.
FINDINGS: There are no findings suspicious for malignancy. Images were
processed with CAD.
IMPRESSION: No mammographic evidence of malignancy. A result letter of this
screening mammogram will be mailed directly to the patient.

RECOMMENDATION:
Screening mammogram in one year. (Code:AS-G-LCT)

BI-RADS CATEGORY  1: Negative.

## 2018-12-28 DIAGNOSIS — M1990 Unspecified osteoarthritis, unspecified site: Secondary | ICD-10-CM | POA: Diagnosis not present

## 2018-12-28 DIAGNOSIS — R69 Illness, unspecified: Secondary | ICD-10-CM | POA: Diagnosis not present

## 2018-12-28 DIAGNOSIS — G8929 Other chronic pain: Secondary | ICD-10-CM | POA: Diagnosis not present

## 2018-12-28 DIAGNOSIS — I252 Old myocardial infarction: Secondary | ICD-10-CM | POA: Diagnosis not present

## 2018-12-28 DIAGNOSIS — E785 Hyperlipidemia, unspecified: Secondary | ICD-10-CM | POA: Diagnosis not present

## 2018-12-28 DIAGNOSIS — I25119 Atherosclerotic heart disease of native coronary artery with unspecified angina pectoris: Secondary | ICD-10-CM | POA: Diagnosis not present

## 2018-12-28 DIAGNOSIS — I10 Essential (primary) hypertension: Secondary | ICD-10-CM | POA: Diagnosis not present

## 2018-12-28 DIAGNOSIS — K219 Gastro-esophageal reflux disease without esophagitis: Secondary | ICD-10-CM | POA: Diagnosis not present

## 2018-12-28 DIAGNOSIS — Z7902 Long term (current) use of antithrombotics/antiplatelets: Secondary | ICD-10-CM | POA: Diagnosis not present

## 2019-01-27 DIAGNOSIS — I25118 Atherosclerotic heart disease of native coronary artery with other forms of angina pectoris: Secondary | ICD-10-CM | POA: Diagnosis not present

## 2019-01-27 DIAGNOSIS — I2510 Atherosclerotic heart disease of native coronary artery without angina pectoris: Secondary | ICD-10-CM | POA: Diagnosis not present

## 2019-01-27 DIAGNOSIS — R69 Illness, unspecified: Secondary | ICD-10-CM | POA: Diagnosis not present

## 2019-01-27 DIAGNOSIS — E785 Hyperlipidemia, unspecified: Secondary | ICD-10-CM | POA: Diagnosis not present

## 2019-01-27 DIAGNOSIS — I10 Essential (primary) hypertension: Secondary | ICD-10-CM | POA: Diagnosis not present

## 2019-01-27 DIAGNOSIS — I200 Unstable angina: Secondary | ICD-10-CM | POA: Diagnosis not present

## 2019-02-01 DIAGNOSIS — R7301 Impaired fasting glucose: Secondary | ICD-10-CM | POA: Diagnosis not present

## 2019-02-01 DIAGNOSIS — I10 Essential (primary) hypertension: Secondary | ICD-10-CM | POA: Diagnosis not present

## 2019-02-01 DIAGNOSIS — K219 Gastro-esophageal reflux disease without esophagitis: Secondary | ICD-10-CM | POA: Diagnosis not present

## 2019-02-01 DIAGNOSIS — Z0000 Encounter for general adult medical examination without abnormal findings: Secondary | ICD-10-CM | POA: Diagnosis not present

## 2019-02-01 DIAGNOSIS — I2510 Atherosclerotic heart disease of native coronary artery without angina pectoris: Secondary | ICD-10-CM | POA: Diagnosis not present

## 2019-02-01 DIAGNOSIS — R69 Illness, unspecified: Secondary | ICD-10-CM | POA: Diagnosis not present

## 2019-02-10 DIAGNOSIS — I10 Essential (primary) hypertension: Secondary | ICD-10-CM | POA: Diagnosis not present

## 2019-02-10 DIAGNOSIS — I2510 Atherosclerotic heart disease of native coronary artery without angina pectoris: Secondary | ICD-10-CM | POA: Diagnosis not present

## 2019-02-10 DIAGNOSIS — R7301 Impaired fasting glucose: Secondary | ICD-10-CM | POA: Diagnosis not present

## 2019-02-15 ENCOUNTER — Other Ambulatory Visit: Payer: Self-pay | Admitting: Cardiology

## 2019-02-15 MED ORDER — ISOSORBIDE MONONITRATE ER 60 MG PO TB24: 90.0000 mg | ORAL_TABLET | Freq: Every day | ORAL | 7 refills | Status: DC

## 2019-04-28 DIAGNOSIS — E785 Hyperlipidemia, unspecified: Secondary | ICD-10-CM | POA: Diagnosis not present

## 2019-04-28 DIAGNOSIS — I25118 Atherosclerotic heart disease of native coronary artery with other forms of angina pectoris: Secondary | ICD-10-CM | POA: Diagnosis not present

## 2019-04-28 DIAGNOSIS — I2510 Atherosclerotic heart disease of native coronary artery without angina pectoris: Secondary | ICD-10-CM | POA: Diagnosis not present

## 2019-04-28 DIAGNOSIS — R69 Illness, unspecified: Secondary | ICD-10-CM | POA: Diagnosis not present

## 2019-04-28 DIAGNOSIS — I10 Essential (primary) hypertension: Secondary | ICD-10-CM | POA: Diagnosis not present

## 2019-04-28 DIAGNOSIS — I200 Unstable angina: Secondary | ICD-10-CM | POA: Diagnosis not present

## 2019-06-07 ENCOUNTER — Telehealth: Payer: Self-pay | Admitting: Cardiology

## 2019-06-07 NOTE — Telephone Encounter (Signed)
Right leg and foot is swollen with tingling in my leg.   Having sob on exertion.

## 2019-06-07 NOTE — Telephone Encounter (Signed)
The patient is calling because about 1 month ago she developed swelling in her Right foot/leg.  She called her PCP about 1 week ago to report some bruising on her foot/ankle, along with the swelling.  She had a hx of falling and injuring her left leg awhile back.    They recommended ice pack/tylenol and to call them today to f/u.  She also mentioned tingling in her Right/Left hand with lower back pain.  She will reach out to her PCP to discuss this today and call us with an update.

## 2019-06-09 DIAGNOSIS — Z23 Encounter for immunization: Secondary | ICD-10-CM | POA: Diagnosis not present

## 2019-06-09 DIAGNOSIS — M545 Low back pain: Secondary | ICD-10-CM | POA: Diagnosis not present

## 2019-06-09 DIAGNOSIS — M7989 Other specified soft tissue disorders: Secondary | ICD-10-CM | POA: Diagnosis not present

## 2019-06-17 ENCOUNTER — Other Ambulatory Visit: Payer: Self-pay

## 2019-06-17 ENCOUNTER — Encounter: Payer: Self-pay | Admitting: Podiatry

## 2019-06-17 ENCOUNTER — Ambulatory Visit (INDEPENDENT_AMBULATORY_CARE_PROVIDER_SITE_OTHER): Payer: Medicare HMO

## 2019-06-17 ENCOUNTER — Other Ambulatory Visit: Payer: Self-pay | Admitting: Podiatry

## 2019-06-17 ENCOUNTER — Ambulatory Visit: Payer: Medicare HMO | Admitting: Podiatry

## 2019-06-17 DIAGNOSIS — M778 Other enthesopathies, not elsewhere classified: Secondary | ICD-10-CM

## 2019-06-17 DIAGNOSIS — M79671 Pain in right foot: Secondary | ICD-10-CM

## 2019-06-17 DIAGNOSIS — R600 Localized edema: Secondary | ICD-10-CM

## 2019-06-17 DIAGNOSIS — R609 Edema, unspecified: Secondary | ICD-10-CM

## 2019-06-17 NOTE — Progress Notes (Signed)
Subjective:   Patient ID: Tonya Bryant, female   DOB: 68 y.o.   MRN: [id number]   HPI Patient states she is had a lot of pain on top of the right foot and is not sure what she may have done but states that it is been sore and making it hard to walk comfortably.  Patient does not remember injury and does not smoke likes to be active   Review of Systems  All other systems reviewed and are negative.       Objective:  Physical Exam Vitals signs and nursing note reviewed.  Constitutional:      Appearance: She is well-developed.  Pulmonary:     Effort: Pulmonary effort is normal.  Musculoskeletal: Normal range of motion.  Skin:    General: Skin is warm.  Neurological:     Mental Status: She is alert.     Neurovascular status found to be intact muscle strength was noted to be adequate range of motion within normal limits.  Patient is found to have dorsal tendinitis right of the extensor complex with no indications of muscle strength loss or other issues with inflammation upon deep palpation.  Patient is found to have good digital perfusion well oriented x3     Assessment:  Extensor tendinitis right with inflammation     Plan:  H&P conditions reviewed and recommended conservative treatment.  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

## 2019-06-20 ENCOUNTER — Other Ambulatory Visit: Payer: Self-pay | Admitting: Family Medicine

## 2019-06-20 DIAGNOSIS — Z1231 Encounter for screening mammogram for malignant neoplasm of breast: Secondary | ICD-10-CM

## 2019-06-22 DIAGNOSIS — R69 Illness, unspecified: Secondary | ICD-10-CM | POA: Diagnosis not present

## 2019-06-22 DIAGNOSIS — I10 Essential (primary) hypertension: Secondary | ICD-10-CM | POA: Diagnosis not present

## 2019-06-22 DIAGNOSIS — I200 Unstable angina: Secondary | ICD-10-CM | POA: Diagnosis not present

## 2019-06-22 DIAGNOSIS — I25118 Atherosclerotic heart disease of native coronary artery with other forms of angina pectoris: Secondary | ICD-10-CM | POA: Diagnosis not present

## 2019-06-22 DIAGNOSIS — E785 Hyperlipidemia, unspecified: Secondary | ICD-10-CM | POA: Diagnosis not present

## 2019-06-22 DIAGNOSIS — I2510 Atherosclerotic heart disease of native coronary artery without angina pectoris: Secondary | ICD-10-CM | POA: Diagnosis not present

## 2019-06-30 DIAGNOSIS — I25118 Atherosclerotic heart disease of native coronary artery with other forms of angina pectoris: Secondary | ICD-10-CM | POA: Diagnosis not present

## 2019-06-30 DIAGNOSIS — I2510 Atherosclerotic heart disease of native coronary artery without angina pectoris: Secondary | ICD-10-CM | POA: Diagnosis not present

## 2019-06-30 DIAGNOSIS — I200 Unstable angina: Secondary | ICD-10-CM | POA: Diagnosis not present

## 2019-06-30 DIAGNOSIS — R69 Illness, unspecified: Secondary | ICD-10-CM | POA: Diagnosis not present

## 2019-06-30 DIAGNOSIS — E785 Hyperlipidemia, unspecified: Secondary | ICD-10-CM | POA: Diagnosis not present

## 2019-06-30 DIAGNOSIS — I10 Essential (primary) hypertension: Secondary | ICD-10-CM | POA: Diagnosis not present

## 2019-07-12 DIAGNOSIS — H2513 Age-related nuclear cataract, bilateral: Secondary | ICD-10-CM | POA: Diagnosis not present

## 2019-07-26 DIAGNOSIS — H25813 Combined forms of age-related cataract, bilateral: Secondary | ICD-10-CM | POA: Diagnosis not present

## 2019-08-05 DIAGNOSIS — I10 Essential (primary) hypertension: Secondary | ICD-10-CM | POA: Diagnosis not present

## 2019-08-05 DIAGNOSIS — R69 Illness, unspecified: Secondary | ICD-10-CM | POA: Diagnosis not present

## 2019-08-05 DIAGNOSIS — I200 Unstable angina: Secondary | ICD-10-CM | POA: Diagnosis not present

## 2019-08-05 DIAGNOSIS — I2510 Atherosclerotic heart disease of native coronary artery without angina pectoris: Secondary | ICD-10-CM | POA: Diagnosis not present

## 2019-08-05 DIAGNOSIS — I25118 Atherosclerotic heart disease of native coronary artery with other forms of angina pectoris: Secondary | ICD-10-CM | POA: Diagnosis not present

## 2019-08-05 DIAGNOSIS — E785 Hyperlipidemia, unspecified: Secondary | ICD-10-CM | POA: Diagnosis not present

## 2019-08-10 ENCOUNTER — Other Ambulatory Visit: Payer: Self-pay

## 2019-08-10 ENCOUNTER — Ambulatory Visit
Admission: RE | Admit: 2019-08-10 | Discharge: 2019-08-10 | Disposition: A | Discharge status: Home / Self Care | Payer: Medicare HMO | Source: Ambulatory Visit | Attending: Family Medicine | Admitting: Family Medicine

## 2019-08-10 DIAGNOSIS — Z1231 Encounter for screening mammogram for malignant neoplasm of breast: Secondary | ICD-10-CM | POA: Diagnosis not present

## 2019-08-22 DIAGNOSIS — H25812 Combined forms of age-related cataract, left eye: Secondary | ICD-10-CM | POA: Diagnosis not present

## 2019-08-22 DIAGNOSIS — H2512 Age-related nuclear cataract, left eye: Secondary | ICD-10-CM | POA: Diagnosis not present

## 2019-09-07 DIAGNOSIS — H2511 Age-related nuclear cataract, right eye: Secondary | ICD-10-CM | POA: Diagnosis not present

## 2019-09-09 DIAGNOSIS — H2511 Age-related nuclear cataract, right eye: Secondary | ICD-10-CM | POA: Diagnosis not present

## 2019-09-09 DIAGNOSIS — H25811 Combined forms of age-related cataract, right eye: Secondary | ICD-10-CM | POA: Diagnosis not present

## 2019-10-06 DIAGNOSIS — I200 Unstable angina: Secondary | ICD-10-CM | POA: Diagnosis not present

## 2019-10-06 DIAGNOSIS — I10 Essential (primary) hypertension: Secondary | ICD-10-CM | POA: Diagnosis not present

## 2019-10-06 DIAGNOSIS — I2510 Atherosclerotic heart disease of native coronary artery without angina pectoris: Secondary | ICD-10-CM | POA: Diagnosis not present

## 2019-10-06 DIAGNOSIS — E785 Hyperlipidemia, unspecified: Secondary | ICD-10-CM | POA: Diagnosis not present

## 2019-10-06 DIAGNOSIS — I25118 Atherosclerotic heart disease of native coronary artery with other forms of angina pectoris: Secondary | ICD-10-CM | POA: Diagnosis not present

## 2019-10-06 DIAGNOSIS — R69 Illness, unspecified: Secondary | ICD-10-CM | POA: Diagnosis not present

## 2019-10-10 NOTE — Progress Notes (Signed)
Cardiology Office Note:    Date:  10/11/2019   ID:  Tonya Bryant, DOB [birth year], MRN [id number]  PCP:  Blair Heys, MD  Cardiologist:  Armanda Magic, MD    Referring MD: Blair Heys, MD   Chief Complaint  Patient presents with  . Coronary Artery Disease  . Hypertension  . Hyperlipidemia    History of Present Illness:    Tonya Bryant is a 69 y.o. female with a hx of CADs/pdrug-eluting stents in the mid circumflex and distal LAD on 11/01/15. Cath showed residual 65% first diagonal stenosisas well.She is on long acting nitrates for chronic angina.She remains on chronic DAPT as well.Nuclear stress test showed no ischemia 09/2016 that was done for CP. She has GERD and her Protonix was increased to BID but had increased fatigue and went back to daily.    She is here today for followup and is doing well.  She denies any chest pain or pressure,  PND, orthopnea, LE edema, dizziness, palpitations or syncope. She has been fairly sedentary for the past year and has not been exercising so when she recently went back to work she noticed that she would get short winded with walking but this has slowly been improving with more activity. She is compliant with her meds and is tolerating meds with no SE.    Past Medical History:  Diagnosis Date  . Anxiety   . CAD (coronary artery disease), native coronary artery 11/02/2015   Ost RCA lesion, 30% stenosed, Prox LAD lesion, 50% stenosed, 1st Diag lesion, 65% stenosed, Ramus lesion, 50% stenosed, Mid Cx lesion, 80% stenosed, Dist LAD lesion, 75% stenosed. S/P DES to distal LAD and mid Cx 10/2015  . Depression (emotion)   . Dyslipidemia, goal LDL below 70 02/07/2016  . Full dentures   . Hypertension     Past Surgical History:  Procedure Laterality Date  . BREAST EXCISIONAL BIOPSY Right   . BREAST SURGERY     lt br bx-neg  . CARDIAC CATHETERIZATION N/A 11/01/2015   Procedure: Left Heart Cath and Coronary Angiography;  Surgeon: Tonny Bollman, MD;  Location: [facility];  Service: Cardiovascular;  Laterality: N/A;  . CARPAL TUNNEL RELEASE Left 07/07/2013   Procedure: LEFT CARPAL TUNNEL RELEASE;  Surgeon: Wyn Forster., MD;  Location: [facility];  Service: Orthopedics;  Laterality: Left;  . COLONOSCOPY    . TOE OSTEOTOMY     rt big toe  . TOOTH EXTRACTION    . TRIGGER FINGER RELEASE Left 07/07/2013   Procedure: LEFT THUMB RELEASE A-1 PULLEY;  Surgeon: Wyn Forster., MD;  Location: [facility];  Service: Orthopedics;  Laterality: Left;  . TUBAL LIGATION      Current Medications: Current Meds  Medication Sig  . amLODipine (NORVASC) 10 MG tablet Take 10 mg by mouth daily.  Marland Kitchen aspirin 81 MG tablet Take 1 tablet (81 mg total) by mouth daily.  . citalopram (CELEXA) 40 MG tablet Take 20 mg by mouth daily.  . clopidogrel (PLAVIX) 75 MG tablet Take 1 tablet by mouth once daily  . diclofenac (VOLTAREN) 75 MG EC tablet TAKE 1 TABLET BY MOUTH TWICE DAILY WITH FOOD AS NEEDED FOR PAIN AND FOR INFLAMMATION  . isosorbide mononitrate (IMDUR) 60 MG 24 hr tablet Take 1.5 tablets (90 mg total) by mouth daily.  Marland Kitchen lisinopril (PRINIVIL,ZESTRIL) 20 MG tablet Take 20 mg by mouth daily.  . nitroGLYCERIN (NITROSTAT) 0.4 MG SL tablet Place 1  tablet (0.4 mg total) under the tongue every 5 (five) minutes as needed for chest pain (for 3 doses only).  . pantoprazole (PROTONIX) 40 MG tablet Take 40 mg by mouth daily. Heart burn  . rosuvastatin (CRESTOR) 40 MG tablet Take 1 tablet by mouth once daily     Allergies:   Patient has no known allergies.   Social History   Socioeconomic History  . Marital status: Single    Spouse name: Not on file  . Number of children: Not on file  . Years of education: Not on file  . Highest education level: Not on file  Occupational History  . Not on file  Tobacco Use  . Smoking status: Never Smoker  . Smokeless tobacco: Never Used  Substance and Sexual Activity   . Alcohol use: Yes    Alcohol/week: 1.0 standard drinks    Types: 1 Standard drinks or equivalent per week  . Drug use: No  . Sexual activity: Not on file  Other Topics Concern  . Not on file  Social History Narrative  . Not on file   Social Determinants of Health   Financial Resource Strain:   . Difficulty of Paying Living Expenses: Not on file  Food Insecurity:   . Worried About Programme researcher, broadcasting/film/video in the Last Year: Not on file  . Ran Out of Food in the Last Year: Not on file  Transportation Needs:   . Lack of Transportation (Medical): Not on file  . Lack of Transportation (Non-Medical): Not on file  Physical Activity:   . Days of Exercise per Week: Not on file  . Minutes of Exercise per Session: Not on file  Stress:   . Feeling of Stress : Not on file  Social Connections:   . Frequency of Communication with Friends and Family: Not on file  . Frequency of Social Gatherings with Friends and Family: Not on file  . Attends Religious Services: Not on file  . Active Member of Clubs or Organizations: Not on file  . Attends Banker Meetings: Not on file  . Marital Status: Not on file     Family History: The patient's family history includes Cancer in her mother; Diabetes in her father and mother; Gout in her father; Hypertension in her father and mother; Stroke in her father and mother.  ROS:   Please see the history of present illness.    ROS  All other systems reviewed and negative.   EKGs/Labs/Other Studies Reviewed:    The following studies were reviewed today: Outside labs on [facility]  EKG:  EKG is  ordered today.  The ekg ordered today demonstrates NSR with no ST changes  Recent Labs: 10/19/2018: ALT 15; BUN 16; Creatinine, Ser 0.70; Potassium 4.0; Sodium 137   Recent Lipid Panel    Component Value Date/Time   CHOL 148 10/19/2018 0800   TRIG 45 10/19/2018 0800   HDL 77 10/19/2018 0800   CHOLHDL 1.9 10/19/2018 0800   CHOLHDL 2.0 12/12/2017 0455    VLDL 11 12/12/2017 0455   LDLCALC 62 10/19/2018 0800    Physical Exam:    VS:  BP 138/74   Pulse 70   Ht 5' (1.524 m)   Wt 219 lb 9.6 oz (99.6 kg)   BMI 42.89 kg/m     Wt Readings from Last 3 Encounters:  10/11/19 219 lb 9.6 oz (99.6 kg)  10/11/18 196 lb 9.6 oz (89.2 kg)  06/15/18 195 lb 6.4 oz (88.6 kg)  GEN:  Well nourished, well developed in no acute distress HEENT: Normal NECK: No JVD; No carotid bruits LYMPHATICS: No lymphadenopathy CARDIAC: RRR, no murmurs, rubs, gallops RESPIRATORY:  Clear to auscultation without rales, wheezing or rhonchi  ABDOMEN: Soft, non-tender, non-distended MUSCULOSKELETAL:  Trace edema; No deformity  SKIN: Warm and dry NEUROLOGIC:  Alert and oriented x 3 PSYCHIATRIC:  Normal affect   ASSESSMENT:    1. Coronary artery disease involving native coronary artery of native heart without angina pectoris   2. Essential hypertension   3. Dyslipidemia, goal LDL below 70    PLAN:    In order of problems listed above:  1.  ASCAD -s/pdrug-eluting stents in the mid circumflex and distal LAD on 11/01/15 with residual 65% first diagonal stenosisas well.  -she has not had any anginal sx since I saw her last -nuclear stress test in 2018 was normal -continue Plavix 75mg  daily, ASA 81mg  daily, Imdur 90mg  daily and statin  2.  HTN -BP controlled on exam -continue Amlodipine 10mg  daily and Lisinopril 20mg  daily -outside labs from [facility] reviewed showing a Creatinine of 0.78 and K+ of 4 in June 2020  3.  HLD  -LDL goal was < 70 -LDL was 62 on outside labs on [facility] -continue Crestor 40mg  daily   Medication Adjustments/Labs and Tests Ordered: Current medicines are reviewed at length with the patient today.  Concerns regarding medicines are outlined above.  Orders Placed This Encounter  Procedures  . EKG 12-Lead   No orders of the defined types were placed in this encounter.   Signed, Fransico Him, MD  10/11/2019 11:21 AM    [place]

## 2019-10-11 ENCOUNTER — Other Ambulatory Visit: Payer: Self-pay

## 2019-10-11 ENCOUNTER — Ambulatory Visit: Payer: Medicare HMO | Admitting: Cardiology

## 2019-10-11 ENCOUNTER — Encounter: Payer: Self-pay | Admitting: Cardiology

## 2019-10-11 VITALS — BP 138/74 | HR 70 | Ht 60.0 in | Wt 219.6 lb

## 2019-10-11 DIAGNOSIS — I2510 Atherosclerotic heart disease of native coronary artery without angina pectoris: Secondary | ICD-10-CM | POA: Diagnosis not present

## 2019-10-11 DIAGNOSIS — I10 Essential (primary) hypertension: Secondary | ICD-10-CM

## 2019-10-11 DIAGNOSIS — E785 Hyperlipidemia, unspecified: Secondary | ICD-10-CM

## 2019-10-11 NOTE — Patient Instructions (Signed)

## 2019-10-30 ENCOUNTER — Other Ambulatory Visit: Payer: Self-pay | Admitting: Cardiology

## 2019-11-07 ENCOUNTER — Ambulatory Visit: Payer: Medicare HMO

## 2019-11-21 DIAGNOSIS — E785 Hyperlipidemia, unspecified: Secondary | ICD-10-CM | POA: Diagnosis not present

## 2019-11-21 DIAGNOSIS — I10 Essential (primary) hypertension: Secondary | ICD-10-CM | POA: Diagnosis not present

## 2019-11-21 DIAGNOSIS — I25118 Atherosclerotic heart disease of native coronary artery with other forms of angina pectoris: Secondary | ICD-10-CM | POA: Diagnosis not present

## 2019-11-21 DIAGNOSIS — R69 Illness, unspecified: Secondary | ICD-10-CM | POA: Diagnosis not present

## 2019-11-21 DIAGNOSIS — I2510 Atherosclerotic heart disease of native coronary artery without angina pectoris: Secondary | ICD-10-CM | POA: Diagnosis not present

## 2019-11-21 DIAGNOSIS — I200 Unstable angina: Secondary | ICD-10-CM | POA: Diagnosis not present

## 2019-12-08 DIAGNOSIS — I2510 Atherosclerotic heart disease of native coronary artery without angina pectoris: Secondary | ICD-10-CM | POA: Diagnosis not present

## 2019-12-08 DIAGNOSIS — I25118 Atherosclerotic heart disease of native coronary artery with other forms of angina pectoris: Secondary | ICD-10-CM | POA: Diagnosis not present

## 2019-12-08 DIAGNOSIS — I200 Unstable angina: Secondary | ICD-10-CM | POA: Diagnosis not present

## 2019-12-08 DIAGNOSIS — I10 Essential (primary) hypertension: Secondary | ICD-10-CM | POA: Diagnosis not present

## 2019-12-08 DIAGNOSIS — E785 Hyperlipidemia, unspecified: Secondary | ICD-10-CM | POA: Diagnosis not present

## 2019-12-08 DIAGNOSIS — R69 Illness, unspecified: Secondary | ICD-10-CM | POA: Diagnosis not present

## 2019-12-15 ENCOUNTER — Other Ambulatory Visit: Payer: Self-pay | Admitting: Cardiology

## 2020-01-02 ENCOUNTER — Other Ambulatory Visit: Payer: Self-pay

## 2020-01-02 ENCOUNTER — Encounter (HOSPITAL_COMMUNITY): Payer: Self-pay

## 2020-01-02 ENCOUNTER — Ambulatory Visit (HOSPITAL_COMMUNITY)
Admission: EM | Admit: 2020-01-02 | Discharge: 2020-01-02 | Disposition: A | Discharge status: Home / Self Care | Payer: Medicare HMO | Attending: Internal Medicine | Admitting: Internal Medicine

## 2020-01-02 ENCOUNTER — Other Ambulatory Visit: Payer: Self-pay | Admitting: Cardiology

## 2020-01-02 DIAGNOSIS — Z7901 Long term (current) use of anticoagulants: Secondary | ICD-10-CM | POA: Insufficient documentation

## 2020-01-02 DIAGNOSIS — Z79899 Other long term (current) drug therapy: Secondary | ICD-10-CM | POA: Diagnosis not present

## 2020-01-02 DIAGNOSIS — Z6841 Body Mass Index (BMI) 40.0 and over, adult: Secondary | ICD-10-CM | POA: Diagnosis not present

## 2020-01-02 DIAGNOSIS — E785 Hyperlipidemia, unspecified: Secondary | ICD-10-CM | POA: Insufficient documentation

## 2020-01-02 DIAGNOSIS — E669 Obesity, unspecified: Secondary | ICD-10-CM | POA: Insufficient documentation

## 2020-01-02 DIAGNOSIS — Z7982 Long term (current) use of aspirin: Secondary | ICD-10-CM | POA: Insufficient documentation

## 2020-01-02 DIAGNOSIS — Z20822 Contact with and (suspected) exposure to covid-19: Secondary | ICD-10-CM | POA: Insufficient documentation

## 2020-01-02 DIAGNOSIS — R69 Illness, unspecified: Secondary | ICD-10-CM | POA: Diagnosis not present

## 2020-01-02 DIAGNOSIS — Z8249 Family history of ischemic heart disease and other diseases of the circulatory system: Secondary | ICD-10-CM | POA: Insufficient documentation

## 2020-01-02 DIAGNOSIS — Z955 Presence of coronary angioplasty implant and graft: Secondary | ICD-10-CM | POA: Insufficient documentation

## 2020-01-02 DIAGNOSIS — J029 Acute pharyngitis, unspecified: Secondary | ICD-10-CM | POA: Diagnosis not present

## 2020-01-02 DIAGNOSIS — R05 Cough: Secondary | ICD-10-CM | POA: Insufficient documentation

## 2020-01-02 DIAGNOSIS — F329 Major depressive disorder, single episode, unspecified: Secondary | ICD-10-CM | POA: Insufficient documentation

## 2020-01-02 DIAGNOSIS — J069 Acute upper respiratory infection, unspecified: Secondary | ICD-10-CM | POA: Insufficient documentation

## 2020-01-02 DIAGNOSIS — I2510 Atherosclerotic heart disease of native coronary artery without angina pectoris: Secondary | ICD-10-CM | POA: Insufficient documentation

## 2020-01-02 DIAGNOSIS — I10 Essential (primary) hypertension: Secondary | ICD-10-CM | POA: Insufficient documentation

## 2020-01-02 DIAGNOSIS — K219 Gastro-esophageal reflux disease without esophagitis: Secondary | ICD-10-CM | POA: Insufficient documentation

## 2020-01-02 LAB — SARS CORONAVIRUS 2 (TAT 6-24 HRS): SARS Coronavirus 2: NEGATIVE

## 2020-01-02 MED ORDER — BENZONATATE 200 MG PO CAPS: 200.0000 mg | ORAL_CAPSULE | Freq: Three times a day (TID) | ORAL | 0 refills | Status: AC | PRN

## 2020-01-02 MED ORDER — FLUTICASONE PROPIONATE 50 MCG/ACT NA SUSP: 1.0000 | Freq: Every day | NASAL | 0 refills | Status: DC

## 2020-01-02 NOTE — Discharge Instructions (Addendum)
Covid test pending, monitor my chart for results Rest and drink plenty of fluids Please use Tessalon/benzonatate every 8 hours as needed for cough Flonase nasal spray 1 to 2 spray in each nostril daily to help with nasal congestion/sinus inflammation and fluid on ear May also supplement with daily Zyrtec/cetirizine or Claritin/loratadine or Mucinex to help with further congestion/drainage Tylenol as needed for pain

## 2020-01-02 NOTE — ED Triage Notes (Signed)
Pt is here with a cough, chills, sore throat, congestion that started 2 days ago. Pt has taken Coricidin to relieve discomfort.

## 2020-01-03 NOTE — ED Provider Notes (Signed)
[place]    CSN: [id number] Arrival date & time: 01/02/20  1019      History   Chief Complaint Chief Complaint  Patient presents with  . Sore Throat  . Nasal Congestion  . Cough    HPI Tonya Bryant is a 69 y.o. female history of hypertension, CAD, presenting today for evaluation of URI symptoms.  Patient has developed cough congestion and sore throat over the past 2 days.  She has had associated chills.  Has been using Coricidin without relief.  Denies any fevers.  Denies GI symptoms.  Denies any close sick contacts.  Denies chest pain or shortness of breath.  HPI  Past Medical History:  Diagnosis Date  . Anxiety   . CAD (coronary artery disease), native coronary artery 11/02/2015   Ost RCA lesion, 30% stenosed, Prox LAD lesion, 50% stenosed, 1st Diag lesion, 65% stenosed, Ramus lesion, 50% stenosed, Mid Cx lesion, 80% stenosed, Dist LAD lesion, 75% stenosed. S/P DES to distal LAD and mid Cx 10/2015  . Depression (emotion)   . Dyslipidemia, goal LDL below 70 02/07/2016  . Full dentures   . Hypertension     Patient Active Problem List   Diagnosis Date Noted  . GERD (gastroesophageal reflux disease) 12/11/2017  . Depression 12/11/2017  . Edema extremities 07/13/2017  . Dyslipidemia, goal LDL below 70 02/07/2016  . CAD (coronary artery disease), native coronary artery 11/02/2015  . HTN (hypertension) 10/31/2015  . Bradycardia 10/31/2015  . Obesity 10/31/2015  . Ingrown right greater toenail 04/27/2013    Past Surgical History:  Procedure Laterality Date  . BREAST EXCISIONAL BIOPSY Right   . BREAST SURGERY     lt br bx-neg  . CARDIAC CATHETERIZATION N/A 11/01/2015   Procedure: Left Heart Cath and Coronary Angiography;  Surgeon: Sherren Mocha, MD;  Location: [facility];  Service: Cardiovascular;  Laterality: N/A;  . CARPAL TUNNEL RELEASE Left 07/07/2013   Procedure: LEFT CARPAL TUNNEL RELEASE;  Surgeon: Cammie Sickle., MD;  Location: [place];  Service: Orthopedics;  Laterality: Left;  . COLONOSCOPY    . TOE OSTEOTOMY     rt big toe  . TOOTH EXTRACTION    . TRIGGER FINGER RELEASE Left 07/07/2013   Procedure: LEFT THUMB RELEASE A-1 PULLEY;  Surgeon: Cammie Sickle., MD;  Location: [place];  Service: Orthopedics;  Laterality: Left;  . TUBAL LIGATION      OB History   No obstetric history on file.      Home Medications    Prior to Admission medications   Medication Sig Start Date End Date Taking? Authorizing Provider  amLODipine (NORVASC) 10 MG tablet Take 10 mg by mouth daily.    [provider]  aspirin 81 MG tablet Take 1 tablet (81 mg total) by mouth daily. 11/02/15   Ghimire, Henreitta Leber, MD  benzonatate (TESSALON) 200 MG capsule Take 1 capsule (200 mg total) by mouth 3 (three) times daily as needed for up to 7 days for cough. 01/02/20 01/09/20  Teagan Heidrick C, PA-C  citalopram (CELEXA) 40 MG tablet Take 20 mg by mouth daily.    [provider]  clopidogrel (PLAVIX) 75 MG tablet Take 1 tablet by mouth once daily 12/16/19   Sueanne Margarita, MD  diclofenac (VOLTAREN) 75 MG EC tablet TAKE 1 TABLET BY MOUTH TWICE DAILY WITH FOOD AS NEEDED FOR PAIN AND FOR INFLAMMATION 02/12/19   [provider]  fluticasone (  FLONASE) 50 MCG/ACT nasal spray Place 1-2 sprays into both nostrils daily for 7 days. 01/02/20 01/09/20  Lyncoln Ledgerwood C, PA-C  isosorbide mononitrate (IMDUR) 60 MG 24 hr tablet TAKE 1 & 1/2 (ONE & ONE-HALF) TABLETS BY MOUTH ONCE DAILY 10/31/19   Quintella Reichert, MD  lisinopril (PRINIVIL,ZESTRIL) 20 MG tablet Take 20 mg by mouth daily.    [provider]  nitroGLYCERIN (NITROSTAT) 0.4 MG SL tablet Place 1 tablet (0.4 mg total) under the tongue every 5 (five) minutes as needed for chest pain (for 3 doses only). 06/15/18   Rosalio Macadamia, NP  pantoprazole (PROTONIX) 40 MG tablet Take 40 mg by mouth daily. Heart burn    [provider]    rosuvastatin (CRESTOR) 40 MG tablet Take 1 tablet by mouth once daily 11/16/18   Quintella Reichert, MD    Family History Family History  Problem Relation Age of Onset  . Cancer Mother   . Diabetes Mother   . Stroke Mother   . Hypertension Mother   . Stroke Father   . Diabetes Father   . Hypertension Father   . Gout Father     Social History Social History   Tobacco Use  . Smoking status: Never Smoker  . Smokeless tobacco: Never Used  Substance Use Topics  . Alcohol use: Yes    Alcohol/week: 1.0 standard drinks    Types: 1 Standard drinks or equivalent per week  . Drug use: No     Allergies   Patient has no known allergies.   Review of Systems Review of Systems  Constitutional: Positive for chills and fatigue. Negative for activity change, appetite change and fever.  HENT: Positive for congestion, rhinorrhea and sore throat. Negative for ear pain, sinus pressure and trouble swallowing.   Eyes: Negative for discharge and redness.  Respiratory: Positive for cough. Negative for chest tightness and shortness of breath.   Cardiovascular: Negative for chest pain.  Gastrointestinal: Negative for abdominal pain, diarrhea, nausea and vomiting.  Musculoskeletal: Negative for myalgias.  Skin: Negative for rash.  Neurological: Negative for dizziness, light-headedness and headaches.     Physical Exam Triage Vital Signs ED Triage Vitals  Enc Vitals Group     BP 01/02/20 1206 139/79     Pulse Rate 01/02/20 1206 65     Resp 01/02/20 1206 19     Temp 01/02/20 1206 98.1 F (36.7 C)     Temp Source 01/02/20 1206 Oral     SpO2 01/02/20 1206 97 %     Weight 01/02/20 1210 218 lb 6.4 oz (99.1 kg)     Height --      Head Circumference --      Peak Flow --      Pain Score 01/02/20 1205 0     Pain Loc --      Pain Edu? --      Excl. in GC? --    No data found.  Updated Vital Signs BP 139/79 (BP Location: Right Arm)   Pulse 65   Temp 98.1 F (36.7 C) (Oral)   Resp 19    Wt 218 lb 6.4 oz (99.1 kg)   SpO2 97%   BMI 42.65 kg/m   Visual Acuity Right Eye Distance:   Left Eye Distance:   Bilateral Distance:    Right Eye Near:   Left Eye Near:    Bilateral Near:     Physical Exam Vitals and nursing note reviewed.  Constitutional:  Appearance: She is well-developed.     Comments: No acute distress  HENT:     Head: Normocephalic and atraumatic.     Ears:     Comments: Bilateral ears without tenderness to palpation of external auricle, tragus and mastoid, EAC's without erythema or swelling, TM's with good bony landmarks and cone of light. Non erythematous. Effusion present on right TM     Nose: Nose normal.     Comments: Nasal mucosa erythematous with swollen turbinates    Mouth/Throat:     Comments: Oral mucosa pink and moist, no tonsillar enlargement or exudate. Posterior pharynx patent and nonerythematous, no uvula deviation or swelling. Normal phonation. Eyes:     Conjunctiva/sclera: Conjunctivae normal.  Cardiovascular:     Rate and Rhythm: Normal rate.  Pulmonary:     Effort: Pulmonary effort is normal. No respiratory distress.     Comments: Breathing comfortably at rest, CTABL, no wheezing, rales or other adventitious sounds auscultated Abdominal:     General: There is no distension.  Musculoskeletal:        General: Normal range of motion.     Cervical back: Neck supple.  Skin:    General: Skin is warm and dry.  Neurological:     Mental Status: She is alert and oriented to person, place, and time.      UC Treatments / Results  Labs (all labs ordered are listed, but only abnormal results are displayed) Labs Reviewed  SARS CORONAVIRUS 2 (TAT 6-24 HRS)    EKG   Radiology No results found.  Procedures Procedures (including critical care time)  Medications Ordered in UC Medications - No data to display  Initial Impression / Assessment and Plan / UC Course  I have reviewed the triage vital signs and the nursing  notes.  Pertinent labs & imaging results that were available during my care of the patient were reviewed by me and considered in my medical decision making (see chart for details).     2 days of URI symptoms, vital signs stable, lungs clear, most likely viral etiology.  Recommending symptomatic and supportive care.  Providing Tessalon and Flonase.  Covid PCR pending.  Discussed strict return precautions. Patient verbalized understanding and is agreeable with plan.  Final Clinical Impressions(s) / UC Diagnoses   Final diagnoses:  Viral URI with cough     Discharge Instructions     Covid test pending, monitor my chart for results Rest and drink plenty of fluids Please use Tessalon/benzonatate every 8 hours as needed for cough Flonase nasal spray 1 to 2 spray in each nostril daily to help with nasal congestion/sinus inflammation and fluid on ear May also supplement with daily Zyrtec/cetirizine or Claritin/loratadine or Mucinex to help with further congestion/drainage Tylenol as needed for pain    ED Prescriptions    Medication Sig Dispense Auth. Provider   benzonatate (TESSALON) 200 MG capsule Take 1 capsule (200 mg total) by mouth 3 (three) times daily as needed for up to 7 days for cough. 28 capsule Kree Rafter C, PA-C   fluticasone (FLONASE) 50 MCG/ACT nasal spray Place 1-2 sprays into both nostrils daily for 7 days. 1 g Preslei Blakley, [place] C, PA-C     PDMP not reviewed this encounter.   Lew Dawes, [place] 01/03/20 [phone]

## 2020-01-10 DIAGNOSIS — S83281A Other tear of lateral meniscus, current injury, right knee, initial encounter: Secondary | ICD-10-CM | POA: Diagnosis not present

## 2020-02-28 ENCOUNTER — Telehealth: Payer: Self-pay | Admitting: Cardiology

## 2020-02-28 NOTE — Telephone Encounter (Signed)
Patient called stating she needs to know the date she had surgery for insurance propose.

## 2020-02-28 NOTE — Telephone Encounter (Signed)
Spoke with the patient and advised her that stents were placed on 11/01/2015.

## 2020-04-02 ENCOUNTER — Other Ambulatory Visit: Payer: Self-pay | Admitting: Nurse Practitioner

## 2020-04-13 DIAGNOSIS — I25118 Atherosclerotic heart disease of native coronary artery with other forms of angina pectoris: Secondary | ICD-10-CM | POA: Diagnosis not present

## 2020-04-13 DIAGNOSIS — R69 Illness, unspecified: Secondary | ICD-10-CM | POA: Diagnosis not present

## 2020-04-13 DIAGNOSIS — I10 Essential (primary) hypertension: Secondary | ICD-10-CM | POA: Diagnosis not present

## 2020-04-13 DIAGNOSIS — E785 Hyperlipidemia, unspecified: Secondary | ICD-10-CM | POA: Diagnosis not present

## 2020-04-24 DIAGNOSIS — I10 Essential (primary) hypertension: Secondary | ICD-10-CM | POA: Diagnosis not present

## 2020-04-24 DIAGNOSIS — Z0000 Encounter for general adult medical examination without abnormal findings: Secondary | ICD-10-CM | POA: Diagnosis not present

## 2020-04-24 DIAGNOSIS — R7301 Impaired fasting glucose: Secondary | ICD-10-CM | POA: Diagnosis not present

## 2020-04-24 DIAGNOSIS — I25119 Atherosclerotic heart disease of native coronary artery with unspecified angina pectoris: Secondary | ICD-10-CM | POA: Diagnosis not present

## 2020-04-24 DIAGNOSIS — R69 Illness, unspecified: Secondary | ICD-10-CM | POA: Diagnosis not present

## 2020-04-24 DIAGNOSIS — K219 Gastro-esophageal reflux disease without esophagitis: Secondary | ICD-10-CM | POA: Diagnosis not present

## 2020-04-24 DIAGNOSIS — Z23 Encounter for immunization: Secondary | ICD-10-CM | POA: Diagnosis not present

## 2020-04-24 DIAGNOSIS — Z1389 Encounter for screening for other disorder: Secondary | ICD-10-CM | POA: Diagnosis not present

## 2020-05-01 DIAGNOSIS — H40013 Open angle with borderline findings, low risk, bilateral: Secondary | ICD-10-CM | POA: Diagnosis not present

## 2020-05-04 DIAGNOSIS — I10 Essential (primary) hypertension: Secondary | ICD-10-CM | POA: Diagnosis not present

## 2020-05-04 DIAGNOSIS — E785 Hyperlipidemia, unspecified: Secondary | ICD-10-CM | POA: Diagnosis not present

## 2020-05-04 DIAGNOSIS — I25118 Atherosclerotic heart disease of native coronary artery with other forms of angina pectoris: Secondary | ICD-10-CM | POA: Diagnosis not present

## 2020-05-04 DIAGNOSIS — R69 Illness, unspecified: Secondary | ICD-10-CM | POA: Diagnosis not present

## 2020-05-28 DIAGNOSIS — I25118 Atherosclerotic heart disease of native coronary artery with other forms of angina pectoris: Secondary | ICD-10-CM | POA: Diagnosis not present

## 2020-05-28 DIAGNOSIS — E785 Hyperlipidemia, unspecified: Secondary | ICD-10-CM | POA: Diagnosis not present

## 2020-05-28 DIAGNOSIS — I10 Essential (primary) hypertension: Secondary | ICD-10-CM | POA: Diagnosis not present

## 2020-05-28 DIAGNOSIS — R69 Illness, unspecified: Secondary | ICD-10-CM | POA: Diagnosis not present

## 2020-07-24 DIAGNOSIS — I10 Essential (primary) hypertension: Secondary | ICD-10-CM | POA: Diagnosis not present

## 2020-07-24 DIAGNOSIS — E785 Hyperlipidemia, unspecified: Secondary | ICD-10-CM | POA: Diagnosis not present

## 2020-07-24 DIAGNOSIS — K219 Gastro-esophageal reflux disease without esophagitis: Secondary | ICD-10-CM | POA: Diagnosis not present

## 2020-07-24 DIAGNOSIS — R69 Illness, unspecified: Secondary | ICD-10-CM | POA: Diagnosis not present

## 2020-07-24 DIAGNOSIS — I25118 Atherosclerotic heart disease of native coronary artery with other forms of angina pectoris: Secondary | ICD-10-CM | POA: Diagnosis not present

## 2020-08-08 DIAGNOSIS — I25118 Atherosclerotic heart disease of native coronary artery with other forms of angina pectoris: Secondary | ICD-10-CM | POA: Diagnosis not present

## 2020-08-08 DIAGNOSIS — K219 Gastro-esophageal reflux disease without esophagitis: Secondary | ICD-10-CM | POA: Diagnosis not present

## 2020-08-08 DIAGNOSIS — E785 Hyperlipidemia, unspecified: Secondary | ICD-10-CM | POA: Diagnosis not present

## 2020-08-08 DIAGNOSIS — I10 Essential (primary) hypertension: Secondary | ICD-10-CM | POA: Diagnosis not present

## 2020-08-08 DIAGNOSIS — I2510 Atherosclerotic heart disease of native coronary artery without angina pectoris: Secondary | ICD-10-CM | POA: Diagnosis not present

## 2020-08-08 DIAGNOSIS — R69 Illness, unspecified: Secondary | ICD-10-CM | POA: Diagnosis not present

## 2020-08-13 ENCOUNTER — Other Ambulatory Visit: Payer: Self-pay | Admitting: Cardiology

## 2020-08-27 ENCOUNTER — Other Ambulatory Visit: Payer: Self-pay | Admitting: Nurse Practitioner

## 2020-09-10 ENCOUNTER — Ambulatory Visit (HOSPITAL_COMMUNITY)
Admission: EM | Admit: 2020-09-10 | Discharge: 2020-09-10 | Disposition: A | Discharge status: Home / Self Care | Payer: Medicare HMO | Attending: Physician Assistant | Admitting: Physician Assistant

## 2020-09-10 ENCOUNTER — Other Ambulatory Visit: Payer: Self-pay

## 2020-09-10 ENCOUNTER — Encounter (HOSPITAL_COMMUNITY): Payer: Self-pay | Admitting: Emergency Medicine

## 2020-09-10 DIAGNOSIS — U071 COVID-19: Secondary | ICD-10-CM | POA: Diagnosis not present

## 2020-09-10 DIAGNOSIS — Z7982 Long term (current) use of aspirin: Secondary | ICD-10-CM | POA: Insufficient documentation

## 2020-09-10 DIAGNOSIS — J069 Acute upper respiratory infection, unspecified: Secondary | ICD-10-CM | POA: Diagnosis not present

## 2020-09-10 DIAGNOSIS — R059 Cough, unspecified: Secondary | ICD-10-CM | POA: Diagnosis not present

## 2020-09-10 DIAGNOSIS — Z79899 Other long term (current) drug therapy: Secondary | ICD-10-CM | POA: Insufficient documentation

## 2020-09-10 MED ORDER — BENZONATATE 100 MG PO CAPS: 100.0000 mg | ORAL_CAPSULE | Freq: Three times a day (TID) | ORAL | 0 refills | Status: DC | PRN

## 2020-09-10 MED ORDER — ALBUTEROL SULFATE HFA 108 (90 BASE) MCG/ACT IN AERS: 1.0000 | INHALATION_SPRAY | Freq: Four times a day (QID) | RESPIRATORY_TRACT | 0 refills | Status: DC | PRN

## 2020-09-10 NOTE — ED Provider Notes (Signed)
[facility]    CSN: [id number] Arrival date & time: 09/10/20  1404      History   Chief Complaint Chief Complaint  Patient presents with  . Cough  . Generalized Body Aches  . Nasal Congestion    HPI Tonya Bryant is a 70 y.o. female.   Pt complains of cough, congestion, body aches that started three days ago.  Reports her daughter recently tested positive for COVID, she lives with daughter.  Tonya Bryant is sick too.  Pt reports she is vaccinated against COVID and has received her booster as well.  She reports intermittent mild shortness of breath.  She has taken Alkaseltzer cold with some improvement.       Past Medical History:  Diagnosis Date  . Anxiety   . CAD (coronary artery disease), native coronary artery 11/02/2015   Ost RCA lesion, 30% stenosed, Prox LAD lesion, 50% stenosed, 1st Diag lesion, 65% stenosed, Ramus lesion, 50% stenosed, Mid Cx lesion, 80% stenosed, Dist LAD lesion, 75% stenosed. S/P DES to distal LAD and mid Cx 10/2015  . Depression (emotion)   . Dyslipidemia, goal LDL below 70 02/07/2016  . Full dentures   . Hypertension     Patient Active Problem List   Diagnosis Date Noted  . GERD (gastroesophageal reflux disease) 12/11/2017  . Depression 12/11/2017  . Edema extremities 07/13/2017  . Dyslipidemia, goal LDL below 70 02/07/2016  . CAD (coronary artery disease), native coronary artery 11/02/2015  . HTN (hypertension) 10/31/2015  . Bradycardia 10/31/2015  . Obesity 10/31/2015  . Ingrown right greater toenail 04/27/2013    Past Surgical History:  Procedure Laterality Date  . BREAST EXCISIONAL BIOPSY Right   . BREAST SURGERY     lt br bx-neg  . CARDIAC CATHETERIZATION N/A 11/01/2015   Procedure: Left Heart Cath and Coronary Angiography;  Surgeon: Tonny Bollman, MD;  Location: [facility] INVASIVE CV LAB;  Service: Cardiovascular;  Laterality: N/A;  . CARPAL TUNNEL RELEASE Left 07/07/2013   Procedure: LEFT CARPAL TUNNEL RELEASE;  Surgeon:  Wyn Forster., MD;  Location: [facility];  Service: Orthopedics;  Laterality: Left;  . COLONOSCOPY    . TOE OSTEOTOMY     rt big toe  . TOOTH EXTRACTION    . TRIGGER FINGER RELEASE Left 07/07/2013   Procedure: LEFT THUMB RELEASE A-1 PULLEY;  Surgeon: Wyn Forster., MD;  Location: [facility];  Service: Orthopedics;  Laterality: Left;  . TUBAL LIGATION      OB History   No obstetric history on file.      Home Medications    Prior to Admission medications   Medication Sig Start Date End Date Taking? Authorizing Provider  albuterol (VENTOLIN HFA) 108 (90 Base) MCG/ACT inhaler Inhale 1-2 puffs into the lungs every 6 (six) hours as needed for wheezing or shortness of breath. 09/10/20  Yes Vern Prestia, PA-C  benzonatate (TESSALON) 100 MG capsule Take 1 capsule (100 mg total) by mouth 3 (three) times daily as needed for cough. 09/10/20  Yes Ott Zimmerle, PA-C  amLODipine (NORVASC) 10 MG tablet Take 10 mg by mouth daily.    [provider]  aspirin 81 MG tablet Take 1 tablet (81 mg total) by mouth daily. 11/02/15   Ghimire, Werner Lean, MD  citalopram (CELEXA) 40 MG tablet Take 20 mg by mouth daily.    [provider]  clopidogrel (PLAVIX) 75 MG tablet Take 1 tablet by mouth once daily 12/16/19  Quintella Reichert, MD  diclofenac (VOLTAREN) 75 MG EC tablet TAKE 1 TABLET BY MOUTH TWICE DAILY WITH FOOD AS NEEDED FOR PAIN AND FOR INFLAMMATION 02/12/19   [provider]  fluticasone (FLONASE) 50 MCG/ACT nasal spray Place 1-2 sprays into both nostrils daily for 7 days. 01/02/20 01/09/20  Wieters, Hallie C, PA-C  isosorbide mononitrate (IMDUR) 60 MG 24 hr tablet Take 1.5 tablets (90 mg total) by mouth daily. Please call and schedule an appointment 1st attempt 08/14/20   Quintella Reichert, MD  lisinopril (PRINIVIL,ZESTRIL) 20 MG tablet Take 20 mg by mouth daily.    [provider]  nitroGLYCERIN (NITROSTAT) 0.4 MG SL tablet PLACE  1 TABLET UNDER THE TONGUE EVERY 5 MINUTES AS NEEDED FOR CHEST PAIN(3 DOSES ONLY) 08/27/20   Rosalio Macadamia, NP  pantoprazole (PROTONIX) 40 MG tablet Take 40 mg by mouth daily. Heart burn    [provider]  rosuvastatin (CRESTOR) 40 MG tablet Take 1 tablet by mouth once daily 01/03/20   Quintella Reichert, MD    Family History Family History  Problem Relation Age of Onset  . Cancer Mother   . Diabetes Mother   . Stroke Mother   . Hypertension Mother   . Stroke Father   . Diabetes Father   . Hypertension Father   . Gout Father     Social History Social History   Tobacco Use  . Smoking status: Never Smoker  . Smokeless tobacco: Never Used  Vaping Use  . Vaping Use: Never used  Substance Use Topics  . Alcohol use: Yes    Alcohol/week: 1.0 standard drink    Types: 1 Standard drinks or equivalent per week  . Drug use: No     Allergies   Patient has no known allergies.   Review of Systems Review of Systems  Constitutional: Positive for chills. Negative for fever.  HENT: Positive for congestion. Negative for ear pain and sore throat.   Eyes: Negative for pain and visual disturbance.  Respiratory: Positive for cough and shortness of breath. Negative for wheezing.   Cardiovascular: Negative for chest pain and palpitations.  Gastrointestinal: Negative for abdominal pain, diarrhea, nausea and vomiting.  Genitourinary: Negative for dysuria and hematuria.  Musculoskeletal: Negative for arthralgias and back pain.  Skin: Negative for color change and rash.  Neurological: Negative for seizures and syncope.  All other systems reviewed and are negative.    Physical Exam Triage Vital Signs ED Triage Vitals  Enc Vitals Group     BP 09/10/20 1613 124/60     Pulse Rate 09/10/20 1613 85     Resp 09/10/20 1613 18     Temp 09/10/20 1613 97.7 F (36.5 C)     Temp Source 09/10/20 1613 Oral     SpO2 09/10/20 1613 96 %     Weight --      Height --      Head Circumference --       Peak Flow --      Pain Score 09/10/20 1612 0     Pain Loc --      Pain Edu? --      Excl. in GC? --    No data found.  Updated Vital Signs BP 124/60 (BP Location: Right Arm)   Pulse 85   Temp 97.7 F (36.5 C) (Oral)   Resp 18   SpO2 96%   Visual Acuity Right Eye Distance:   Left Eye Distance:   Bilateral Distance:  Right Eye Near:   Left Eye Near:    Bilateral Near:     Physical Exam Vitals and nursing note reviewed.  Constitutional:      General: She is not in acute distress.    Appearance: She is well-developed and well-nourished.  HENT:     Head: Normocephalic and atraumatic.  Eyes:     Conjunctiva/sclera: Conjunctivae normal.  Cardiovascular:     Rate and Rhythm: Normal rate and regular rhythm.     Heart sounds: No murmur heard.   Pulmonary:     Effort: Pulmonary effort is normal. No respiratory distress.     Breath sounds: Normal breath sounds. No decreased breath sounds, wheezing, rhonchi or rales.  Abdominal:     Palpations: Abdomen is soft.     Tenderness: There is no abdominal tenderness.  Musculoskeletal:        General: No edema.     Cervical back: Neck supple.  Skin:    General: Skin is warm and dry.  Neurological:     Mental Status: She is alert.  Psychiatric:        Mood and Affect: Mood and affect normal.      UC Treatments / Results  Labs (all labs ordered are listed, but only abnormal results are displayed) Labs Reviewed  SARS CORONAVIRUS 2 (TAT 6-24 HRS)    EKG   Radiology No results found.  Procedures Procedures (including critical care time)  Medications Ordered in UC Medications - No data to display  Initial Impression / Assessment and Plan / UC Course  I have reviewed the triage vital signs and the nursing notes.  Pertinent labs & imaging results that were available during my care of the patient were reviewed by me and considered in my medical decision making (see chart for details).     Pt with no signs  of respiratory distress in clinic today.  Lungs clear, O2 97%.  Recommend Cloricidin and Flonase.  Advised to push fluids.  Albuterol inhaler and tessalon pearls prescribed to use as needed.  Strict return precautions given.  COVID testing pending.  Final Clinical Impressions(s) / UC Diagnoses   Final diagnoses:  Viral upper respiratory tract infection     Discharge Instructions     Take medications as prescribed Can take over the counter Coricidin.  Push fluids, rest.  Return for evaluation in the ED if you develop difficulty breathing.  COVID test pending.  If positive self isolate 5 days from symptom onset and then wear a mask around others for 5 days.    ED Prescriptions    Medication Sig Dispense Auth. Provider   albuterol (VENTOLIN HFA) 108 (90 Base) MCG/ACT inhaler Inhale 1-2 puffs into the lungs every 6 (six) hours as needed for wheezing or shortness of breath. 1 each Julionna Marczak, PA-C   benzonatate (TESSALON) 100 MG capsule Take 1 capsule (100 mg total) by mouth 3 (three) times daily as needed for cough. 21 capsule Jodell Cipro, PA-C     PDMP not reviewed this encounter.   Jodell Cipro, PA-C 09/10/20 1737

## 2020-09-10 NOTE — Discharge Instructions (Signed)
Take medications as prescribed Can take over the counter Coricidin.  Push fluids, rest.  Return for evaluation in the ED if you develop difficulty breathing.  COVID test pending.  If positive self isolate 5 days from symptom onset and then wear a mask around others for 5 days.

## 2020-09-10 NOTE — ED Triage Notes (Signed)
Pt states that she has a cough, runny nose, body aches, and chills. Pt states that she tried OTC medications and they have helped a little bit not much. Pt states that her sx started Saturday.

## 2020-09-11 LAB — SARS CORONAVIRUS 2 (TAT 6-24 HRS): SARS Coronavirus 2: POSITIVE — AB

## 2020-09-17 DIAGNOSIS — I10 Essential (primary) hypertension: Secondary | ICD-10-CM | POA: Diagnosis not present

## 2020-09-17 DIAGNOSIS — K219 Gastro-esophageal reflux disease without esophagitis: Secondary | ICD-10-CM | POA: Diagnosis not present

## 2020-09-17 DIAGNOSIS — I2510 Atherosclerotic heart disease of native coronary artery without angina pectoris: Secondary | ICD-10-CM | POA: Diagnosis not present

## 2020-09-17 DIAGNOSIS — I25118 Atherosclerotic heart disease of native coronary artery with other forms of angina pectoris: Secondary | ICD-10-CM | POA: Diagnosis not present

## 2020-09-17 DIAGNOSIS — R69 Illness, unspecified: Secondary | ICD-10-CM | POA: Diagnosis not present

## 2020-09-17 DIAGNOSIS — E785 Hyperlipidemia, unspecified: Secondary | ICD-10-CM | POA: Diagnosis not present

## 2020-10-09 ENCOUNTER — Ambulatory Visit: Payer: Medicare HMO | Admitting: Cardiology

## 2020-10-09 ENCOUNTER — Other Ambulatory Visit: Payer: Self-pay

## 2020-10-09 ENCOUNTER — Encounter: Payer: Self-pay | Admitting: Cardiology

## 2020-10-09 VITALS — BP 132/80 | HR 69 | Ht 60.0 in | Wt 210.0 lb

## 2020-10-09 DIAGNOSIS — I10 Essential (primary) hypertension: Secondary | ICD-10-CM

## 2020-10-09 DIAGNOSIS — I2510 Atherosclerotic heart disease of native coronary artery without angina pectoris: Secondary | ICD-10-CM

## 2020-10-09 DIAGNOSIS — E785 Hyperlipidemia, unspecified: Secondary | ICD-10-CM | POA: Diagnosis not present

## 2020-10-09 NOTE — Patient Instructions (Signed)

## 2020-10-09 NOTE — Progress Notes (Signed)
Cardiology Office Note:    Date:  10/09/2020   ID:  Tonya Bryant, DOB 16-Dec-1950, MRN [id number]  PCP:  Blair Heys, MD  Cardiologist:  Armanda Magic, MD    Referring MD: Blair Heys, MD   Chief Complaint  Patient presents with  . Coronary Artery Disease  . Hypertension  . Hyperlipidemia    History of Present Illness:    Tonya Bryant is a 70 y.o. female with a hx of CADs/pdrug-eluting stents in the mid circumflex and distal LAD on 11/01/15. Cath showed residual 65% first diagonal stenosisas well.She is on long acting nitrates for chronic angina.She remains on chronic DAPT as well.Nuclear stress test showed no ischemia 09/2016 that was done for CP. She has GERD and her Protonix was increased to BID but had increased fatigue and went back to daily.    She is here today for followup and is doing well.  She denies any chest pain or pressure, SOB, DOE, PND, orthopnea, LE edema, dizziness, palpitations or syncope. She is compliant with her meds and is tolerating meds with no SE.    Past Medical History:  Diagnosis Date  . Anxiety   . CAD (coronary artery disease), native coronary artery 11/02/2015   Ost RCA lesion, 30% stenosed, Prox LAD lesion, 50% stenosed, 1st Diag lesion, 65% stenosed, Ramus lesion, 50% stenosed, Mid Cx lesion, 80% stenosed, Dist LAD lesion, 75% stenosed. S/P DES to distal LAD and mid Cx 10/2015  . Depression (emotion)   . Dyslipidemia, goal LDL below 70 02/07/2016  . Full dentures   . Hypertension     Past Surgical History:  Procedure Laterality Date  . BREAST EXCISIONAL BIOPSY Right   . BREAST SURGERY     lt br bx-neg  . CARDIAC CATHETERIZATION N/A 11/01/2015   Procedure: Left Heart Cath and Coronary Angiography;  Surgeon: Tonny Bollman, MD;  Location: [facility] INVASIVE CV LAB;  Service: Cardiovascular;  Laterality: N/A;  . CARPAL TUNNEL RELEASE Left 07/07/2013   Procedure: LEFT CARPAL TUNNEL RELEASE;  Surgeon: Wyn Forster., MD;  Location:  [facility];  Service: Orthopedics;  Laterality: Left;  . COLONOSCOPY    . TOE OSTEOTOMY     rt big toe  . TOOTH EXTRACTION    . TRIGGER FINGER RELEASE Left 07/07/2013   Procedure: LEFT THUMB RELEASE A-1 PULLEY;  Surgeon: Wyn Forster., MD;  Location: [facility];  Service: Orthopedics;  Laterality: Left;  . TUBAL LIGATION      Current Medications: No outpatient medications have been marked as taking for the 10/09/20 encounter (Office Visit) with Quintella Reichert, MD.     Allergies:   Patient has no known allergies.   Social History   Socioeconomic History  . Marital status: Single    Spouse name: Not on file  . Number of children: Not on file  . Years of education: Not on file  . Highest education level: Not on file  Occupational History  . Not on file  Tobacco Use  . Smoking status: Never Smoker  . Smokeless tobacco: Never Used  Vaping Use  . Vaping Use: Never used  Substance and Sexual Activity  . Alcohol use: Yes    Alcohol/week: 1.0 standard drink    Types: 1 Standard drinks or equivalent per week  . Drug use: No  . Sexual activity: Not Currently    Birth control/protection: None  Other Topics Concern  . Not on file  Social History Narrative  .  Not on file   Social Determinants of Health   Financial Resource Strain: Not on file  Food Insecurity: Not on file  Transportation Needs: Not on file  Physical Activity: Not on file  Stress: Not on file  Social Connections: Not on file     Family History: The patient's family history includes Cancer in her mother; Diabetes in her father and mother; Gout in her father; Hypertension in her father and mother; Stroke in her father and mother.  ROS:   Please see the history of present illness.    ROS  All other systems reviewed and negative.   EKGs/Labs/Other Studies Reviewed:    The following studies were reviewed today: Outside labs on [facility]  EKG:  EKG is  ordered today.  The ekg  ordered today demonstrates NSR with no ST changes  Recent Labs: No results found for requested labs within last 8760 hours.   Recent Lipid Panel    Component Value Date/Time   CHOL 148 10/19/2018 0800   TRIG 45 10/19/2018 0800   HDL 77 10/19/2018 0800   CHOLHDL 1.9 10/19/2018 0800   CHOLHDL 2.0 12/12/2017 0455   VLDL 11 12/12/2017 0455   LDLCALC 62 10/19/2018 0800    Physical Exam:    VS:  There were no vitals taken for this visit.    Wt Readings from Last 3 Encounters:  01/02/20 218 lb 6.4 oz (99.1 kg)  10/11/19 219 lb 9.6 oz (99.6 kg)  10/11/18 196 lb 9.6 oz (89.2 kg)     GEN: Well nourished, well developed in no acute distress HEENT: Normal NECK: No JVD; No carotid bruits LYMPHATICS: No lymphadenopathy CARDIAC:RRR, no murmurs, rubs, gallops RESPIRATORY:  Clear to auscultation without rales, wheezing or rhonchi  ABDOMEN: Soft, non-tender, non-distended MUSCULOSKELETAL:  No edema; No deformity  SKIN: Warm and dry NEUROLOGIC:  Alert and oriented x 3 PSYCHIATRIC:  Normal affect    ASSESSMENT:    1. Coronary artery disease involving native coronary artery of native heart without angina pectoris   2. Primary hypertension   3. Dyslipidemia, goal LDL below 70    PLAN:    In order of problems listed above:  1.  ASCAD -s/pdrug-eluting stents in the mid circumflex and distal LAD on 11/01/15 with residual 65% first diagonal stenosisas well.  -nuclear stress test in 2018 was normal -she denies any anginal symptoms -continue Plavix 75mg  daily, ASA 81mg  daily, Imdur 90mg  daily and statin  2.  HTN -BP well controlled on exam today -continue Amlodipine 10mg  daily and Lisinopril 20mg  daily -outside labs from [facility] reviewed showing a SCr of 0.78 and K+4.3  3.  HLD  -LDL goal was < 70 -LDL was 50 on outside labs on [facility] in Aug 2021 -continue Crestor 40mg  daily   Medication Adjustments/Labs and Tests Ordered: Current medicines are reviewed at length with the patient  today.  Concerns regarding medicines are outlined above.  No orders of the defined types were placed in this encounter.  No orders of the defined types were placed in this encounter.   Signed, , MD  10/09/2020 9:45 AM    [facility]

## 2020-10-25 ENCOUNTER — Other Ambulatory Visit: Payer: Self-pay | Admitting: Family Medicine

## 2020-10-25 DIAGNOSIS — Z1231 Encounter for screening mammogram for malignant neoplasm of breast: Secondary | ICD-10-CM

## 2020-11-17 ENCOUNTER — Other Ambulatory Visit: Payer: Self-pay | Admitting: Cardiology

## 2020-12-17 ENCOUNTER — Other Ambulatory Visit: Payer: Self-pay | Admitting: Cardiology

## 2020-12-17 ENCOUNTER — Other Ambulatory Visit: Payer: Self-pay

## 2020-12-17 ENCOUNTER — Ambulatory Visit
Admission: RE | Admit: 2020-12-17 | Discharge: 2020-12-17 | Disposition: A | Discharge status: Home / Self Care | Payer: Medicare HMO | Source: Ambulatory Visit | Attending: Family Medicine | Admitting: Family Medicine

## 2020-12-17 DIAGNOSIS — Z1231 Encounter for screening mammogram for malignant neoplasm of breast: Secondary | ICD-10-CM

## 2021-01-05 ENCOUNTER — Ambulatory Visit (HOSPITAL_COMMUNITY)
Admission: EM | Admit: 2021-01-05 | Discharge: 2021-01-05 | Disposition: A | Discharge status: Home / Self Care | Payer: Medicare HMO | Attending: Physician Assistant | Admitting: Physician Assistant

## 2021-01-05 ENCOUNTER — Encounter (HOSPITAL_COMMUNITY): Payer: Self-pay

## 2021-01-05 ENCOUNTER — Other Ambulatory Visit: Payer: Self-pay

## 2021-01-05 DIAGNOSIS — R519 Headache, unspecified: Secondary | ICD-10-CM

## 2021-01-05 DIAGNOSIS — M25512 Pain in left shoulder: Secondary | ICD-10-CM | POA: Diagnosis not present

## 2021-01-05 DIAGNOSIS — M25511 Pain in right shoulder: Secondary | ICD-10-CM

## 2021-01-05 MED ORDER — PREDNISONE 20 MG PO TABS: 20.0000 mg | ORAL_TABLET | Freq: Every day | ORAL | 0 refills | Status: DC

## 2021-01-05 MED ORDER — TIZANIDINE HCL 4 MG PO TABS: 4.0000 mg | ORAL_TABLET | Freq: Four times a day (QID) | ORAL | 0 refills | Status: DC | PRN

## 2021-01-05 NOTE — ED Provider Notes (Signed)
[facility]    CSN: [id number] Arrival date & time: 01/05/21  1108      History   Chief Complaint Chief Complaint  Patient presents with  . Back Pain  . Shoulder Pain  . Dizziness    HPI Tonya Bryant is a 70 y.o. female.   Patient here c/w b/l upper thoracic shoulder and neck pain x 1 week.  Denies injury, fall, or trauma. She reports she had URI last week with cough, that has resolved, but now having back pain. No change in activity levels.  She admits pain, tenderness, pain with shoulder movements.  2 days ago noted pain moved to posterior neck and occipital lobe of head.  She has some numbness R arm and hand.       Past Medical History:  Diagnosis Date  . Anxiety   . CAD (coronary artery disease), native coronary artery 11/02/2015   Ost RCA lesion, 30% stenosed, Prox LAD lesion, 50% stenosed, 1st Diag lesion, 65% stenosed, Ramus lesion, 50% stenosed, Mid Cx lesion, 80% stenosed, Dist LAD lesion, 75% stenosed. S/P DES to distal LAD and mid Cx 10/2015  . Depression (emotion)   . Dyslipidemia, goal LDL below 70 02/07/2016  . Full dentures   . Hypertension     Patient Active Problem List   Diagnosis Date Noted  . GERD (gastroesophageal reflux disease) 12/11/2017  . Depression 12/11/2017  . Edema extremities 07/13/2017  . Dyslipidemia, goal LDL below 70 02/07/2016  . CAD (coronary artery disease), native coronary artery 11/02/2015  . HTN (hypertension) 10/31/2015  . Bradycardia 10/31/2015  . Obesity 10/31/2015  . Ingrown right greater toenail 04/27/2013    Past Surgical History:  Procedure Laterality Date  . BREAST EXCISIONAL BIOPSY Right   . BREAST SURGERY     lt br bx-neg  . CARDIAC CATHETERIZATION N/A 11/01/2015   Procedure: Left Heart Cath and Coronary Angiography;  Surgeon: Tonny BollmanMichael Cooper, MD;  [facility];  Service: Cardiovascular;  Laterality: N/A;  . CARPAL TUNNEL RELEASE Left 07/07/2013   Procedure: LEFT CARPAL TUNNEL RELEASE;   Surgeon: Wyn Forsterobert V Sypher Jr., MD;  Location: [facility];  Service: Orthopedics;  Laterality: Left;  . COLONOSCOPY    . TOE OSTEOTOMY     rt big toe  . TOOTH EXTRACTION    . TRIGGER FINGER RELEASE Left 07/07/2013   Procedure: LEFT THUMB RELEASE A-1 PULLEY;  Surgeon: Wyn Forsterobert V Sypher Jr., MD;  Location: [facility];  Service: Orthopedics;  Laterality: Left;  . TUBAL LIGATION      OB History   No obstetric history on file.      Home Medications    Prior to Admission medications   Medication Sig Start Date End Date Taking? Authorizing Provider  predniSONE (DELTASONE) 20 MG tablet Take 1 tablet (20 mg total) by mouth daily with breakfast. 01/05/21  Yes Evern CoreLindquist, Tanaja Ganger, PA-C  tiZANidine (ZANAFLEX) 4 MG tablet Take 1 tablet (4 mg total) by mouth every 6 (six) hours as needed for muscle spasms. 01/05/21  Yes Evern CoreLindquist, Nemesis Rainwater, PA-C  amLODipine (NORVASC) 10 MG tablet Take 10 mg by mouth daily.    [provider]  aspirin 81 MG tablet Take 1 tablet (81 mg total) by mouth daily. 11/02/15   Ghimire, Werner LeanShanker M, MD  citalopram (CELEXA) 40 MG tablet Take 20 mg by mouth daily.    [provider]  clopidogrel (PLAVIX) 75 MG tablet Take 1 tablet by mouth once daily 12/17/20  Quintella Reichert, MD  diclofenac (VOLTAREN) 75 MG EC tablet TAKE 1 TABLET BY MOUTH TWICE DAILY WITH FOOD AS NEEDED FOR PAIN AND FOR INFLAMMATION 02/12/19   [provider]  fluticasone (FLONASE) 50 MCG/ACT nasal spray Place 1-2 sprays into both nostrils daily for 7 days. 01/02/20 01/09/20  Wieters, Hallie C, PA-C  isosorbide mononitrate (IMDUR) 60 MG 24 hr tablet Take 1.5 tablets (90 mg total) by mouth daily. 11/19/20   Quintella Reichert, MD  lisinopril (PRINIVIL,ZESTRIL) 20 MG tablet Take 20 mg by mouth daily.    [provider]  nitroGLYCERIN (NITROSTAT) 0.4 MG SL tablet PLACE 1 TABLET UNDER THE TONGUE EVERY 5 MINUTES AS NEEDED FOR CHEST PAIN(3 DOSES ONLY) 08/27/20   Rosalio Macadamia, NP  pantoprazole (PROTONIX) 40 MG tablet Take 40 mg by mouth daily. Heart burn    [provider]  rosuvastatin (CRESTOR) 40 MG tablet Take 1 tablet by mouth once daily 01/03/20   Quintella Reichert, MD    Family History Family History  Problem Relation Age of Onset  . Cancer Mother   . Diabetes Mother   . Stroke Mother   . Hypertension Mother   . Stroke Father   . Diabetes Father   . Hypertension Father   . Gout Father     Social History Social History   Tobacco Use  . Smoking status: Never Smoker  . Smokeless tobacco: Never Used  Vaping Use  . Vaping Use: Never used  Substance Use Topics  . Alcohol use: Yes    Alcohol/week: 1.0 standard drink    Types: 1 Standard drinks or equivalent per week  . Drug use: No     Allergies   Patient has no known allergies.   Review of Systems Review of Systems  Constitutional: Negative for chills, diaphoresis, fatigue and fever.  HENT: Negative for congestion and rhinorrhea.   Eyes: Negative for photophobia and visual disturbance.  Respiratory: Negative for cough, chest tightness, shortness of breath and wheezing.   Cardiovascular: Negative for chest pain, palpitations and leg swelling.  Gastrointestinal: Negative for abdominal pain, constipation, diarrhea, nausea and vomiting.  Musculoskeletal: Positive for joint swelling. Negative for arthralgias and gait problem.  Skin: Negative for color change and rash.  Neurological: Positive for light-headedness, numbness (right hand) and headaches. Negative for dizziness and weakness.  Hematological: Negative for adenopathy. Does not bruise/bleed easily.  Psychiatric/Behavioral: Negative for confusion, decreased concentration and sleep disturbance.     Physical Exam Triage Vital Signs ED Triage Vitals  Enc Vitals Group     BP 01/05/21 1141 (!) 161/75     Pulse Rate 01/05/21 1141 62     Resp 01/05/21 1141 18     Temp 01/05/21 1141 98.3 F (36.8 C)     Temp Source 01/05/21  1141 Oral     SpO2 01/05/21 1141 96 %     Weight --      Height --      Head Circumference --      Peak Flow --      Pain Score 01/05/21 1140 7     Pain Loc --      Pain Edu? --      Excl. in GC? --    No data found.  Updated Vital Signs BP (!) 177/65 (BP Location: Right Arm)   Pulse 62   Temp 98.3 F (36.8 C) (Oral)   Resp 18   SpO2 96%   Visual Acuity Right Eye Distance:  Left Eye Distance:   Bilateral Distance:    Right Eye Near:   Left Eye Near:    Bilateral Near:     Physical Exam Vitals and nursing note reviewed.  Constitutional:      General: She is not in acute distress.    Appearance: Normal appearance. She is not ill-appearing.  HENT:     Head: Normocephalic and atraumatic.      Right Ear: Tympanic membrane and ear canal normal.     Left Ear: Tympanic membrane and ear canal normal.     Nose: No congestion or rhinorrhea.     Mouth/Throat:     Pharynx: No oropharyngeal exudate or posterior oropharyngeal erythema.  Eyes:     General: No scleral icterus.    Extraocular Movements: Extraocular movements intact.     Right eye: Normal extraocular motion and no nystagmus.     Left eye: Normal extraocular motion and no nystagmus.     Conjunctiva/sclera: Conjunctivae normal.     Funduscopic exam:    Right eye: No papilledema.        Left eye: No papilledema.  Cardiovascular:     Rate and Rhythm: Normal rate and regular rhythm.     Heart sounds: No murmur heard.   Pulmonary:     Effort: Pulmonary effort is normal. No respiratory distress.     Breath sounds: Normal breath sounds. No wheezing or rales.  Musculoskeletal:     Cervical back: Normal range of motion. Spasms and tenderness present. No rigidity or bony tenderness. No pain with movement.     Thoracic back: Spasms and tenderness present. No bony tenderness. Normal range of motion.       Back:  Skin:    Capillary Refill: Capillary refill takes less than 2 seconds.     Coloration: Skin is not  jaundiced.     Findings: No rash.  Neurological:     General: No focal deficit present.     Mental Status: She is alert and oriented to person, place, and time.     GCS: GCS eye subscore is 4. GCS verbal subscore is 5. GCS motor subscore is 6.     Cranial Nerves: Cranial nerves are intact. No cranial nerve deficit.     Motor: No weakness, tremor, abnormal muscle tone or pronator drift.     Gait: Gait normal.     Deep Tendon Reflexes:     Reflex Scores:      Brachioradialis reflexes are 2+ on the right side and 2+ on the left side.      Patellar reflexes are 2+ on the right side and 2+ on the left side. Psychiatric:        Mood and Affect: Mood normal.        Behavior: Behavior normal.      UC Treatments / Results  Labs (all labs ordered are listed, but only abnormal results are displayed) Labs Reviewed - No data to display  EKG   Radiology No results found.  Procedures Procedures (including critical care time)  Medications Ordered in UC Medications - No data to display  Initial Impression / Assessment and Plan / UC Course  I have reviewed the triage vital signs and the nursing notes.  Pertinent labs & imaging results that were available during my care of the patient were reviewed by me and considered in my medical decision making (see chart for details).     Strict ED precautions provided Follow up with PCP regarding blood  pressure Take medication as prescribed Final Clinical Impressions(s) / UC Diagnoses   Final diagnoses:  Acute pain of both shoulders  Acute nonintractable headache, unspecified headache type     Discharge Instructions     Go to ED if no improvement Follow up with PCP for monitoring of blood pressure     ED Prescriptions    Medication Sig Dispense Auth. Provider   tiZANidine (ZANAFLEX) 4 MG tablet Take 1 tablet (4 mg total) by mouth every 6 (six) hours as needed for muscle spasms. 30 tablet Evern Core, PA-C   predniSONE  (DELTASONE) 20 MG tablet Take 1 tablet (20 mg total) by mouth daily with breakfast. 3 tablet Evern Core, PA-C     PDMP not reviewed this encounter.   Evern Core, PA-C 01/05/21 1423

## 2021-01-05 NOTE — ED Triage Notes (Signed)
Pt in with c/o bilateral shoulder pian, lower back pain x 1 week  Pt also c/o headache and lightheadedness x 2 days  Pt has been taking tylenol with temporary relief

## 2021-01-05 NOTE — Discharge Instructions (Signed)
Go to ED if no improvement Follow up with PCP for monitoring of blood pressure

## 2021-01-07 DIAGNOSIS — R69 Illness, unspecified: Secondary | ICD-10-CM | POA: Diagnosis not present

## 2021-01-07 DIAGNOSIS — I25118 Atherosclerotic heart disease of native coronary artery with other forms of angina pectoris: Secondary | ICD-10-CM | POA: Diagnosis not present

## 2021-01-07 DIAGNOSIS — K219 Gastro-esophageal reflux disease without esophagitis: Secondary | ICD-10-CM | POA: Diagnosis not present

## 2021-01-07 DIAGNOSIS — E785 Hyperlipidemia, unspecified: Secondary | ICD-10-CM | POA: Diagnosis not present

## 2021-01-07 DIAGNOSIS — I10 Essential (primary) hypertension: Secondary | ICD-10-CM | POA: Diagnosis not present

## 2021-01-20 ENCOUNTER — Other Ambulatory Visit: Payer: Self-pay | Admitting: Cardiology

## 2021-04-28 ENCOUNTER — Other Ambulatory Visit: Payer: Self-pay | Admitting: Cardiology

## 2021-04-29 DIAGNOSIS — R6889 Other general symptoms and signs: Secondary | ICD-10-CM | POA: Diagnosis not present

## 2021-04-29 DIAGNOSIS — R0981 Nasal congestion: Secondary | ICD-10-CM | POA: Diagnosis not present

## 2021-04-29 DIAGNOSIS — J029 Acute pharyngitis, unspecified: Secondary | ICD-10-CM | POA: Diagnosis not present

## 2021-04-29 DIAGNOSIS — R059 Cough, unspecified: Secondary | ICD-10-CM | POA: Diagnosis not present

## 2021-04-29 DIAGNOSIS — U071 COVID-19: Secondary | ICD-10-CM | POA: Diagnosis not present

## 2021-05-15 DIAGNOSIS — I25119 Atherosclerotic heart disease of native coronary artery with unspecified angina pectoris: Secondary | ICD-10-CM | POA: Diagnosis not present

## 2021-05-15 DIAGNOSIS — I10 Essential (primary) hypertension: Secondary | ICD-10-CM | POA: Diagnosis not present

## 2021-05-15 DIAGNOSIS — K219 Gastro-esophageal reflux disease without esophagitis: Secondary | ICD-10-CM | POA: Diagnosis not present

## 2021-05-15 DIAGNOSIS — Z0000 Encounter for general adult medical examination without abnormal findings: Secondary | ICD-10-CM | POA: Diagnosis not present

## 2021-05-15 DIAGNOSIS — Z1159 Encounter for screening for other viral diseases: Secondary | ICD-10-CM | POA: Diagnosis not present

## 2021-05-15 DIAGNOSIS — R69 Illness, unspecified: Secondary | ICD-10-CM | POA: Diagnosis not present

## 2021-05-15 DIAGNOSIS — E785 Hyperlipidemia, unspecified: Secondary | ICD-10-CM | POA: Diagnosis not present

## 2021-05-15 DIAGNOSIS — Z23 Encounter for immunization: Secondary | ICD-10-CM | POA: Diagnosis not present

## 2021-05-15 DIAGNOSIS — R7301 Impaired fasting glucose: Secondary | ICD-10-CM | POA: Diagnosis not present

## 2021-05-21 DIAGNOSIS — H40013 Open angle with borderline findings, low risk, bilateral: Secondary | ICD-10-CM | POA: Diagnosis not present

## 2021-05-31 DIAGNOSIS — M79671 Pain in right foot: Secondary | ICD-10-CM | POA: Diagnosis not present

## 2021-05-31 DIAGNOSIS — M79672 Pain in left foot: Secondary | ICD-10-CM | POA: Diagnosis not present

## 2021-06-11 DIAGNOSIS — R69 Illness, unspecified: Secondary | ICD-10-CM | POA: Diagnosis not present

## 2021-06-25 DIAGNOSIS — G5601 Carpal tunnel syndrome, right upper limb: Secondary | ICD-10-CM | POA: Diagnosis not present

## 2021-06-25 DIAGNOSIS — G5602 Carpal tunnel syndrome, left upper limb: Secondary | ICD-10-CM | POA: Diagnosis not present

## 2021-07-01 DIAGNOSIS — M19072 Primary osteoarthritis, left ankle and foot: Secondary | ICD-10-CM | POA: Diagnosis not present

## 2021-07-01 DIAGNOSIS — M19071 Primary osteoarthritis, right ankle and foot: Secondary | ICD-10-CM | POA: Diagnosis not present

## 2021-07-29 ENCOUNTER — Other Ambulatory Visit: Payer: Self-pay

## 2021-07-29 MED ORDER — NITROGLYCERIN 0.4 MG SL SUBL: SUBLINGUAL_TABLET | SUBLINGUAL | 3 refills | Status: DC

## 2021-08-01 DIAGNOSIS — I10 Essential (primary) hypertension: Secondary | ICD-10-CM | POA: Diagnosis not present

## 2021-08-01 DIAGNOSIS — K219 Gastro-esophageal reflux disease without esophagitis: Secondary | ICD-10-CM | POA: Diagnosis not present

## 2021-08-01 DIAGNOSIS — R69 Illness, unspecified: Secondary | ICD-10-CM | POA: Diagnosis not present

## 2021-08-01 DIAGNOSIS — E785 Hyperlipidemia, unspecified: Secondary | ICD-10-CM | POA: Diagnosis not present

## 2021-09-26 DIAGNOSIS — R200 Anesthesia of skin: Secondary | ICD-10-CM | POA: Diagnosis not present

## 2021-10-02 DIAGNOSIS — G5601 Carpal tunnel syndrome, right upper limb: Secondary | ICD-10-CM | POA: Diagnosis not present

## 2021-10-10 DIAGNOSIS — R200 Anesthesia of skin: Secondary | ICD-10-CM | POA: Diagnosis not present

## 2021-10-18 ENCOUNTER — Other Ambulatory Visit: Payer: Self-pay | Admitting: Orthopedic Surgery

## 2021-10-18 ENCOUNTER — Telehealth: Payer: Self-pay | Admitting: *Deleted

## 2021-10-18 NOTE — Telephone Encounter (Signed)
° °  Pre-operative Risk Assessment    Patient Name: Tonya Bryant  DOB: Jun 04, 1951 MRN: [id number]      Request for Surgical Clearance    Procedure:   RIGHT ENDOSCOPIC CARPAL TUNNEL RELEASE  Date of Surgery:  Clearance 11/01/21                                 Surgeon:  DR. Milly Jakob Surgeon's Group or Practice Name:  [facility] Phone number:  [phone] Fax number:  [phone] ATTN: Lattie Corns    Type of Clearance Requested:   - Medical  - Pharmacy:  Hold Aspirin and Clopidogrel (Plavix)     Type of Anesthesia:  MAC   Additional requests/questions:    Jiles Prows   10/18/2021, 11:39 AM

## 2021-10-18 NOTE — Telephone Encounter (Signed)
Pt agreeable to plan of care for pre op appt needed. Pt has been scheduled to see Tereso Newcomer, [facility] 10/23/21 @ 11:20. Pt thanked me for the call and the help. I will send notes to [facility] for upcoming appt. Will send FYI to requesting office the pt has appt 10/23/21.

## 2021-10-18 NOTE — Telephone Encounter (Signed)
Primary Cardiologist:Traci Turner, MD  Chart reviewed as part of pre-operative protocol coverage. Because of Tonya Bryant's past medical history and time since last visit, he/she will require a follow-up visit in order to better assess preoperative cardiovascular risk.  Pre-op covering staff: - Please schedule appointment and call patient to inform them. - Please contact requesting surgeon's office via preferred method (i.e, phone, fax) to inform them of need for appointment prior to surgery.  If applicable, this message will also be routed to pharmacy pool and/or primary cardiologist for input on holding anticoagulant/antiplatelet agent as requested below so that this information is available at time of patient's appointment.   [name], NP  10/18/2021, 2:10 PM

## 2021-10-22 DIAGNOSIS — Z01810 Encounter for preprocedural cardiovascular examination: Secondary | ICD-10-CM | POA: Insufficient documentation

## 2021-10-22 NOTE — Progress Notes (Addendum)
Cardiology Office Note:    Date:  10/23/2021   ID:  Tonya Bryant, DOB November 26, 1950, MRN [id number]  PCP:  Gaynelle Arabian, MD  [facility] Providers Cardiologist:  Fransico Him, MD    Referring MD: Gaynelle Arabian, MD   Chief Complaint:  Surgical clearance   Patient Profile: Coronary artery disease  S/p DES to [facility]; DES to dLAD in 10/2015 Myoview 2018: no ischemia  Myoview 11/2017: no ischemia  GERD Hypertension  Hyperlipidemia  Prior CV Studies: Myoview 12/13/17 No ischemia or infarction, EF 67; low risk   Echocardiogram 12/12/17 Mod LVH, EF 60-65, no RWMA, Gr 1 DD  Cardiac catheterization 11/01/15 oRCA 30 pLAD 50, dLAD 75; D1 65 RI 50 mLCx 80 PCI: 2.25 x 16 mm Promus DES to dLAD PCI: 2.75 x 12 mm Promus DES to mLCx   History of Present Illness:   Tonya Bryant is a 71 y.o. female with the above problem list.  She was last seen by Dr. Radford Pax in 2/22.  She returns for surgical clearance.  She needs to have a R carpal tunnel release under conscious sedation with Dr. Milly Jakob on 11/01/21.  She is here alone.  She has not had any chest discomfort.  She does note dyspnea with exertion over the past year.  She feels like it is getting worse.  She works at [employer].  She often has to stop during her work to catch her breath.  She sleeps on an incline chronically without significant change.  She has not had syncope.  She has mild pedal edema that is dependent.  It improves with elevation.         Past Medical History:  Diagnosis Date   Anxiety    CAD (coronary artery disease) 11/02/2015   s/p DES to dLAD, s/p DES to mLCx in 10/2015 // Myoview 4/19: no ischemia // Echo 4/19: EF 60-65, mod LVH, Gr 1 DD   Depression (emotion)    Dyslipidemia, goal LDL below 70 02/07/2016   Full dentures    GERD (gastroesophageal reflux disease)    Hypertension    Current Medications: Current Meds  Medication Sig   ALPRAZolam (XANAX) 0.25 MG tablet Take 1/2-One whole tablet by mouth  daily   amLODipine (NORVASC) 10 MG tablet Take 10 mg by mouth daily.   aspirin 81 MG tablet Take 1 tablet (81 mg total) by mouth daily.   citalopram (CELEXA) 40 MG tablet Take 20 mg by mouth daily.   clopidogrel (PLAVIX) 75 MG tablet Take 1 tablet by mouth once daily   diclofenac (VOLTAREN) 75 MG EC tablet TAKE 1 TABLET BY MOUTH TWICE DAILY WITH FOOD AS NEEDED FOR PAIN AND FOR INFLAMMATION   gabapentin (NEURONTIN) 300 MG capsule Take 300 mg by mouth 3 (three) times daily.   isosorbide mononitrate (IMDUR) 60 MG 24 hr tablet Take 1.5 tablets (90 mg total) by mouth daily.   lisinopril (PRINIVIL,ZESTRIL) 20 MG tablet Take 20 mg by mouth daily.   nitroGLYCERIN (NITROSTAT) 0.4 MG SL tablet PLACE 1 TABLET UNDER THE TONGUE EVERY 5 MINUTES AS NEEDED FOR CHEST PAIN(3 DOSES ONLY)   pantoprazole (PROTONIX) 40 MG tablet Take 40 mg by mouth daily. Heart burn   predniSONE (DELTASONE) 20 MG tablet Take 1 tablet (20 mg total) by mouth daily with breakfast.   rosuvastatin (CRESTOR) 40 MG tablet Take 1 tablet by mouth once daily    Allergies:   Patient has no known allergies.   Social History   Tobacco  Use   Smoking status: Never   Smokeless tobacco: Never  Vaping Use   Vaping Use: Never used  Substance Use Topics   Alcohol use: Yes    Alcohol/week: 1.0 standard drink    Types: 1 Standard drinks or equivalent per week   Drug use: No    Family Hx: The patient's family history includes Cancer in her mother; Diabetes in her father and mother; Gout in her father; Hypertension in her father and mother; Stroke in her father and mother.  Review of Systems  Gastrointestinal:  Negative for hematochezia.  Genitourinary:  Negative for hematuria.    EKGs/Labs/Other Test Reviewed:    EKG:  EKG is  ordered today.  The ekg ordered today demonstrates NSR, HR 62, normal axis, anteroseptal Q waves, no ST-T wave changes, QTc 422  Labs obtained through [facility] - personally reviewed and interpreted: 05/15/21: TC  118, HDL 57, LDL 47, Tg 65, A1c 6.2, Creatinine 0.84, K+ 4.3, ALT 18  Risk Assessment/Calculations:         Physical Exam:    VS:  BP 112/60 (BP Location: Right Arm, Patient Position: Sitting, Cuff Size: Large)    Pulse 62    Ht 5' (1.524 m)    Wt 213 lb 12.8 oz (97 kg)    SpO2 95%    BMI 41.75 kg/m     Wt Readings from Last 3 Encounters:  10/23/21 213 lb 12.8 oz (97 kg)  10/09/20 210 lb (95.3 kg)  01/02/20 218 lb 6.4 oz (99.1 kg)    Constitutional:      Appearance: Healthy appearance. Not in distress.  Neck:     Vascular: No JVR. JVD normal.  Pulmonary:     Effort: Pulmonary effort is normal.     Breath sounds: No wheezing. No rales.  Cardiovascular:     Normal rate. Regular rhythm. Normal S1. Normal S2.      Murmurs: There is no murmur.  Edema:    Peripheral edema present.    Pretibial: bilateral trace edema of the pretibial area. Abdominal:     Palpations: Abdomen is soft.  Skin:    General: Skin is warm and dry.  Neurological:     Mental Status: Alert and oriented to person, place and time.     Cranial Nerves: Cranial nerves are intact.        ASSESSMENT & PLAN:   Shortness of breath Etiology for her dyspnea is not entirely clear.  I suspect it is related to obesity and deconditioning.  It has been 4 years since her last assessment for ischemia.  She needs to undergo carpal tunnel surgery soon and the request is to stop both aspirin and Plavix.  Her EKG today does not demonstrate any acute changes.  In light of her need for surgical clearance and current symptoms of shortness of breath, I have recommended proceeding with a Lexiscan Myoview to rule out ischemia.  I have also recommended proceeding with an echocardiogram.  If the studies are normal, she can follow-up in 1 year. Lexiscan Myoview Echocardiogram  Preoperative cardiovascular examination [name] perioperative risk of a major cardiac event is 0.9% according to the Revised Cardiac Risk Index (RCRI).   Therefore, she is at low risk for perioperative complications.   Her functional capacity is fair at 4.4 METs according to the Duke Activity Status Index (DASI).  However, she notes symptoms of shortness of breath with exertion that seem to be worsening.  It has been several years  since her last assessment for ischemia. Recommendations: The patient requires an echocardiogram before a disposition can be made regarding surgical risk.                 The patient requires a nuclear stress test before a disposition can be made regarding surgical risk. Antiplatelet and/or Anticoagulation Recommendations: The patient should remain on Aspirin without interruption.  However, if bleeding risk is too great, aspirin can be held for 7 days prior to surgery and resumed postop as soon as it is felt to be safe. Clopidogrel (Plavix) can be held for 5 days prior to her surgery and resumed as soon as possible post op.  Dyslipidemia, goal LDL below 70 Labs from [facility] personally reviewed and interpreted.  From 05/15/2021: Total cholesterol 118, HDL 57, LDL 47, triglycerides 65, ALT 18.  Lipids optimal.  Continue rosuvastatin 40 mg daily.  Coronary artery disease involving native coronary artery of native heart with angina pectoris [employer]) History of prior DES to the mid LCx and DES to the distal LAD in 2017.  Myoview in 2019 was low risk.  As noted, she has had symptoms of shortness of breath with exertion.  Proceed with Lexiscan Myoview and echocardiogram as outlined.  Continue aspirin 81 mg daily, clopidogrel 75 mg daily, isosorbide mononitrate 90 mg daily, amlodipine 10 mg daily, rosuvastatin 40 mg daily.  Follow-up in 1 year unless stress test or echocardiogram is abnormal.   Addendum 11/01/2021: Myoview 3/23: EF 65, no ischemia, low risk Echocardiogram 3/23: EF 65-70, no RWMA, mild LVH, Gr 1 DD, GLS -21.1, normal RVSF, normal PASP, RVSP 23.6, mild LAE  The results of her echocardiogram and stress test are optimal.  She  can proceed with her planned surgery at acceptable risk.       Shared Decision Making/Informed Consent The risks [chest pain, shortness of breath, cardiac arrhythmias, dizziness, blood pressure fluctuations, myocardial infarction, stroke/transient ischemic attack, nausea, vomiting, allergic reaction, radiation exposure, metallic taste sensation and life-threatening complications (estimated to be 1 in 10,000)], benefits (risk stratification, diagnosing coronary artery disease, treatment guidance) and alternatives of a nuclear stress test were discussed in detail with Ms. Bryant and she agrees to proceed.   Dispo:  Return in about 1 year (around 10/24/2022) for Routine Follow Up with Dr. Radford Pax.   Medication Adjustments/Labs and Tests Ordered: Current medicines are reviewed at length with the patient today.  Concerns regarding medicines are outlined above.  Tests Ordered: Orders Placed This Encounter  Procedures   Cardiac Stress Test: Informed Consent Details: Physician/Practitioner Attestation; Transcribe to consent form and obtain patient signature   MYOCARDIAL PERFUSION IMAGING   EKG 12-Lead   ECHOCARDIOGRAM COMPLETE   Medication Changes: No orders of the defined types were placed in this encounter.  Signed, Richardson Dopp, PA-C  10/23/2021 1:31 PM    [facility] [address], [place], [place]  [address] Phone: [phone]; Fax: [phone]

## 2021-10-23 ENCOUNTER — Encounter: Payer: Self-pay | Admitting: Physician Assistant

## 2021-10-23 ENCOUNTER — Other Ambulatory Visit: Payer: Self-pay

## 2021-10-23 ENCOUNTER — Ambulatory Visit (INDEPENDENT_AMBULATORY_CARE_PROVIDER_SITE_OTHER): Payer: No Typology Code available for payment source | Admitting: Physician Assistant

## 2021-10-23 VITALS — BP 112/60 | HR 62 | Ht 60.0 in | Wt 213.8 lb

## 2021-10-23 DIAGNOSIS — R0602 Shortness of breath: Secondary | ICD-10-CM | POA: Diagnosis not present

## 2021-10-23 DIAGNOSIS — Z01810 Encounter for preprocedural cardiovascular examination: Secondary | ICD-10-CM | POA: Diagnosis not present

## 2021-10-23 DIAGNOSIS — E785 Hyperlipidemia, unspecified: Secondary | ICD-10-CM | POA: Diagnosis not present

## 2021-10-23 DIAGNOSIS — I10 Essential (primary) hypertension: Secondary | ICD-10-CM

## 2021-10-23 DIAGNOSIS — I25119 Atherosclerotic heart disease of native coronary artery with unspecified angina pectoris: Secondary | ICD-10-CM | POA: Diagnosis not present

## 2021-10-23 DIAGNOSIS — I2510 Atherosclerotic heart disease of native coronary artery without angina pectoris: Secondary | ICD-10-CM

## 2021-10-23 NOTE — Assessment & Plan Note (Signed)
History of prior DES to the mid LCx and DES to the distal LAD in 2017.  Myoview in 2019 was low risk.  As noted, she has had symptoms of shortness of breath with exertion.  Proceed with Lexiscan Myoview and echocardiogram as outlined.  Continue aspirin 81 mg daily, clopidogrel 75 mg daily, isosorbide mononitrate 90 mg daily, amlodipine 10 mg daily, rosuvastatin 40 mg daily.  Follow-up in 1 year unless stress test or echocardiogram is abnormal. ?

## 2021-10-23 NOTE — Telephone Encounter (Signed)
Notes faxed to surgeon. ?Will f/u once Myoview and Echocardiogram are done. ?Tereso Newcomer, PA-C    ?10/23/2021 1:32 PM   ?

## 2021-10-23 NOTE — Patient Instructions (Signed)
Medication Instructions:  ? ?Your physician recommends that you continue on your current medications as directed. Please refer to the Current Medication list given to you today. ? ?*If you need a refill on your cardiac medications before your next appointment, please call your pharmacy* ? ? ?Testing/Procedures: ? ?Your physician has requested that you have an echocardiogram. Echocardiography is a painless test that uses sound waves to create images of your heart. It provides your doctor with information about the size and shape of your heart and how well your heart?s chambers and valves are working. This procedure takes approximately one hour. There are no restrictions for this procedure. Thursday, MARCH 2 @10 :35 AM ? ? ?You are scheduled for a Myocardial Perfusion Imaging Study on Thursday, March 9 at 7:45 am.  ? ?Please arrive 15 minutes prior to your appointment time for registration and insurance purposes.  ? ?The test will take approximately 3 to 4 hours to complete; you may bring reading material. If someone comes with you to your appointment, they will need to remain in the main lobby due to limited space in the testing area.  ?  ? ?How to prepare for your Myocardial Perfusion test: ? ? Do not eat or drink 3 hours prior to your test, except you may have water.  ? ? Do not consume products containing caffeine (regular or decaffeinated) 12 hours prior to your test (ex: coffee, chocolate, soda, tea) ? ? Do bring a list of your current medications with you. If not listed below, you may take your medications as normal.  ? ? Bring any held medication to your appointment, as you may be required to take it once the test is complete.  ? ?Do wear comfortable clothes (no dresses ) and walking shoes. Tennis shoes are preferred. No heels or open toed shoes. ? ?Do not wear perfume, or lotions (deodorant is allowed).  ? ?If these instructions are not followed, you test will have to be rescheduled.  ? ?Please report to [address] for your test. If you have questions or concerns about your appointment, please call the Nuclear Lab at [phone]. ? ?If you cannot keep your appointment, please provide 24 hour notification to the Nuclear lab to avoid a possible $50 charge to your account.  ? ?  ? ? ?Follow-Up: ?At [facility], you and your health needs are our priority.  As part of our continuing mission to provide you with exceptional heart care, we have created designated Provider Care Teams.  These Care Teams include your primary Cardiologist (physician) and Advanced Practice Providers (APPs -  Physician Assistants and Nurse Practitioners) who all work together to provide you with the care you need, when you need it. ? ?We recommend signing up for the patient portal called "MyChart".  Sign up information is provided on this After Visit Summary.  MyChart is used to connect with patients for Virtual Visits (Telemedicine).  Patients are able to view lab/test results, encounter notes, upcoming appointments, etc.  Non-urgent messages can be sent to your provider as well.   ?To learn more about what you can do with MyChart, go to [email or web address].   ? ?Your next appointment:   ?1 year(s) ? ?The format for your next appointment:   ?In Person ? ?Provider:   ?Fransico Him, MD   ? ? ?Other Instructions ? ?Your physician wants you to follow-up in: 1 year with Dr. Radford Pax.  You will receive a reminder letter in the mail  two months in advance. If you don't receive a letter, please call our office to schedule the follow-up appointment. ?  ?

## 2021-10-23 NOTE — Assessment & Plan Note (Addendum)
Tonya Bryant perioperative risk of a major cardiac event is 0.9% according to the Revised Cardiac Risk Index (RCRI).  Therefore, she is at low risk for perioperative complications.   Her functional capacity is fair at 4.4 METs according to the Duke Activity Status Index (DASI).  However, she notes symptoms of shortness of breath with exertion that seem to be worsening.  It has been several years since her last assessment for ischemia. ?Recommendations: ?The patient requires an echocardiogram before a disposition can be made regarding surgical risk.                 ?The patient requires a nuclear stress test before a disposition can be made regarding surgical risk. ?Antiplatelet and/or Anticoagulation Recommendations: ?The patient should remain on Aspirin without interruption.  However, if bleeding risk is too great, aspirin can be held for 7 days prior to surgery and resumed postop as soon as it is felt to be safe. ?Clopidogrel (Plavix) can be held for 5 days prior to her surgery and resumed as soon as possible post op. ?

## 2021-10-23 NOTE — Assessment & Plan Note (Addendum)
Etiology for her dyspnea is not entirely clear.  I suspect it is related to obesity and deconditioning.  It has been 4 years since her last assessment for ischemia.  She needs to undergo carpal tunnel surgery soon and the request is to stop both aspirin and Plavix.  Her EKG today does not demonstrate any acute changes.  In light of her need for surgical clearance and current symptoms of shortness of breath, I have recommended proceeding with a Lexiscan Myoview to rule out ischemia.  I have also recommended proceeding with an echocardiogram.  If the studies are normal, she can follow-up in 1 year. ?? Lexiscan Myoview ?? Echocardiogram ?

## 2021-10-23 NOTE — Assessment & Plan Note (Signed)
Labs from [facility] personally reviewed and interpreted.  From 05/15/2021: Total cholesterol 118, HDL 57, LDL 47, triglycerides 65, ALT 18.  Lipids optimal.  Continue rosuvastatin 40 mg daily. ?

## 2021-10-24 ENCOUNTER — Ambulatory Visit (HOSPITAL_COMMUNITY): Payer: No Typology Code available for payment source | Attending: Cardiology

## 2021-10-24 DIAGNOSIS — Z01810 Encounter for preprocedural cardiovascular examination: Secondary | ICD-10-CM | POA: Insufficient documentation

## 2021-10-24 DIAGNOSIS — I25119 Atherosclerotic heart disease of native coronary artery with unspecified angina pectoris: Secondary | ICD-10-CM | POA: Insufficient documentation

## 2021-10-24 DIAGNOSIS — R0602 Shortness of breath: Secondary | ICD-10-CM | POA: Insufficient documentation

## 2021-10-24 LAB — ECHOCARDIOGRAM COMPLETE
Area-P 1/2: 3.07 cm2
S' Lateral: 1.8 cm

## 2021-10-25 ENCOUNTER — Encounter: Payer: Self-pay | Admitting: Physician Assistant

## 2021-10-28 ENCOUNTER — Telehealth (HOSPITAL_COMMUNITY): Payer: Self-pay

## 2021-10-28 NOTE — Telephone Encounter (Signed)
Spoke with the patient, detailed instructions given. She stated she understood and would be here for her test. Asked to call back with any questions. S.Alean Kromer EMTP ?

## 2021-10-31 ENCOUNTER — Ambulatory Visit (HOSPITAL_COMMUNITY): Payer: No Typology Code available for payment source | Attending: Internal Medicine

## 2021-10-31 ENCOUNTER — Other Ambulatory Visit: Payer: Self-pay

## 2021-10-31 DIAGNOSIS — R0602 Shortness of breath: Secondary | ICD-10-CM

## 2021-10-31 DIAGNOSIS — R200 Anesthesia of skin: Secondary | ICD-10-CM | POA: Diagnosis not present

## 2021-10-31 DIAGNOSIS — I25119 Atherosclerotic heart disease of native coronary artery with unspecified angina pectoris: Secondary | ICD-10-CM

## 2021-10-31 DIAGNOSIS — Z01810 Encounter for preprocedural cardiovascular examination: Secondary | ICD-10-CM

## 2021-10-31 LAB — MYOCARDIAL PERFUSION IMAGING
LV dias vol: 20 mL (ref 46–106)
LV sys vol: 56 mL
Nuc Stress EF: 65 %
Peak HR: 71 {beats}/min
Rest HR: 51 {beats}/min
Rest Nuclear Isotope Dose: 10.4 mCi
SDS: 1
SRS: 0
SSS: 1
ST Depression (mm): 0 mm
Stress Nuclear Isotope Dose: 32.3 mCi
TID: 0.85

## 2021-10-31 MED ORDER — TECHNETIUM TC 99M TETROFOSMIN IV KIT
10.4000 | PACK | Freq: Once | INTRAVENOUS | Status: AC | PRN
  Administered 2021-10-31: 10.4 via INTRAVENOUS
  Filled 2021-10-31: qty 11

## 2021-10-31 MED ORDER — REGADENOSON 0.4 MG/5ML IV SOLN
0.4000 mg | Freq: Once | INTRAVENOUS | Status: AC
  Administered 2021-10-31: 0.4 mg via INTRAVENOUS

## 2021-10-31 MED ORDER — TECHNETIUM TC 99M TETROFOSMIN IV KIT
32.3000 | PACK | Freq: Once | INTRAVENOUS | Status: AC | PRN
  Administered 2021-10-31: 32.3 via INTRAVENOUS
  Filled 2021-10-31: qty 33

## 2021-10-31 NOTE — H&P (Signed)
History: ?CC / Reason for Visit: Bilateral hand numbness and tingling ?HPI: This patient returns to clinic today mainly to discuss the 2 incision technique of endoscopic carpal tunnel release that will be performed at [facility] on 11/11/2021.  She reminds me that she is on Plavix.  She did have a cardiac stress test today that we should get the results for soon.   ? ?HPI 10/10/2021: This patient returns reevaluation, having undergone interval NCS/EMG with Dr. Regino Schultze on 10-02-21, revealing severe right CTS.  She reports her symptoms remain the same and she is desiring to proceed surgically. ? ?HPI 09-26-21: This patient presents for reevaluation, indicating that her left hand is essentially normal without ever having numbness and tingling following carpal tunnel injection 11-on-22.  On the right side temporarily improved, it has become very bothersome again.  She continues nighttime splinting.  She does take a blood thinner but cannot recall what it is besides aspirin for having cardiac stents placed years ago.  She reports her cardiologist is Dr. Mayford Knife here in town. ? ?06-25-21: This patient is a 71 year old female who indicates that she has had numbness and tingling in the right worse than the left for several years.  She reports that she does have a nighttime splint for the right but is not helping.  She states that it wakes her at nighttime and is uncomfortable when she is driving.  She reports that it has become pretty consistent as opposed to intermittent and it is bothersome to her.  She is not a diabetic.  She is on a blood thinner and cannot take NSAIDs. ? ?Review of systems as related to current complaint reviewed and unchanged. ? ?Exam:  ?Vitals: Refer to EMR. ?Constitutional:  WD, WN, NAD ?HEENT:  NCAT, EOMI ?Neuro/Psych:  Alert & oriented to person, place, and time; appropriate mood & affect ?Lymphatic: No generalized UE edema or lymphadenopathy ?Extremities / MSK:  Both UE are normal with  respect to appearance, ranges of motion, joint stability, muscle strength/tone, sensation, & perfusion except as otherwise noted: ? ?The examination of bilateral upper extremities is much like it was on 10/10/2021 with bilateral hands are normal in appearance with full digital motion and no thenar wasting.  On the right, there is altered sensibility into the median nerve distribution with appropriate ring finger splitting, but monofilaments today measured 2.83 across all the digits.  There is a Tinel sign over the median nerve at the wrist crease, and mild provocation with carpal compression testing ? ?Labs / Xrays:  ?No radiographic studies obtained today. ? ?Assessment: ?NCS-confirmed right carpal tunnel syndrome, left side symptoms have resolved ? ?Plan:  ?We discussed these findings, she will be proceeding with right 2 Incision Technique Endoscopic Carpal Tunl. release.  We discussed that she needs to contact her prescribing physician to determine how to manage her aspirin and plavix perioperatively.  The details of the operative procedure were discussed with the patient.  Questions were invited and answered.  In addition to the goal of the procedure, the risks of the procedure to include but not limited to bleeding; infection; damage to the nerves or blood vessels that could result in bleeding, numbness, weakness, chronic pain, and the need for additional procedures; stiffness; the need for revision surgery; and anesthetic risks were reviewed.  No specific outcome was guaranteed or implied.  Informed consent was obtained. ?

## 2021-11-01 NOTE — Telephone Encounter (Signed)
?  Dois Davenport with Ortho calling to f/u clearance since pt done with Echo and Lexi  ?

## 2021-11-01 NOTE — Telephone Encounter (Signed)
I will send message to provider who ordered testing to see if he has cleared the pt and sent over his clearance.  ?

## 2021-11-03 NOTE — Telephone Encounter (Signed)
Notes faxed to surgeon after echocardiogram and Myoview results returned. ?Richardson Dopp, PA-C    ?11/03/2021 4:50 PM   ?

## 2021-11-04 ENCOUNTER — Other Ambulatory Visit: Payer: Self-pay

## 2021-11-04 ENCOUNTER — Encounter (HOSPITAL_BASED_OUTPATIENT_CLINIC_OR_DEPARTMENT_OTHER): Payer: Self-pay | Admitting: Orthopedic Surgery

## 2021-11-10 NOTE — Anesthesia Preprocedure Evaluation (Addendum)
Anesthesia Evaluation  ?Patient identified by MRN, date of birth, ID band ?Patient awake ? ? ? ?Reviewed: ?Allergy & Precautions, NPO status , Patient's Chart, lab work & pertinent test results ? ?Airway ?Mallampati: II ? ?TM Distance: >3 FB ?Neck ROM: Full ? ? ? Dental ? ?(+) Lower Dentures, Partial Upper,  ?  ?Pulmonary ?shortness of breath,  ?  ?Pulmonary exam normal ?breath sounds clear to auscultation ? ? ? ? ? ? Cardiovascular ?hypertension, Pt. on medications ?+ angina + CAD and + Cardiac Stents (on plavix)  ?Normal cardiovascular exam ?Rhythm:Regular Rate:Normal ? ?10/31/21 Stress test ??  The study is normal. The study is low risk. ??  No ST deviation was noted. ??  LV perfusion is abnormal. Defect 1: There is a small defect with mild reduction in uptake present in the apical anteroseptal location(s) that is fixed. There is normal wall motion in the defect area. Consistent with artifact caused by breast attenuation. ??  Left ventricular function is normal. End diastolic cavity size is normal. ??  Prior study available for comparison from 12/13/2017. No changes compared to prior study. ?? ? ?  ?Neuro/Psych ?PSYCHIATRIC DISORDERS Anxiety Depression   ? GI/Hepatic ?GERD  ,  ?Endo/Other  ?Morbid obesityhyperlipidemia ? Renal/GU ?  ? ?  ?Musculoskeletal ? ? Abdominal ?(+) + obese,   ?Peds ? Hematology ?  ?Anesthesia Other Findings ? ? Reproductive/Obstetrics ? ?  ? ? ? ? ? ? ? ? ? ? ? ? ? ?  ?  ? ? ? ? ? ?Anesthesia Physical ?Anesthesia Plan ? ?ASA: 3 ? ?Anesthesia Plan: MAC  ? ?Post-op Pain Management: Tylenol PO (pre-op)*  ? ?Induction: Intravenous ? ?PONV Risk Score and Plan: 2 and Midazolam, Treatment may vary due to age or medical condition, Ondansetron and Propofol infusion ? ?Airway Management Planned: Natural Airway and Simple Face Mask ? ?Additional Equipment: None ? ?Intra-op Plan:  ? ?Post-operative Plan:  ? ?Informed Consent: I have reviewed the patients History and  Physical, chart, labs and discussed the procedure including the risks, benefits and alternatives for the proposed anesthesia with the patient or authorized representative who has indicated his/her understanding and acceptance.  ? ? ? ?Dental advisory given ? ?Plan Discussed with: CRNA, Anesthesiologist and Surgeon ? ?Anesthesia Plan Comments: (Local by surgeon. Propofol gtt. Natural airway. Norton Blizzard, MD  ?)  ? ? ? ? ? ?Anesthesia Quick Evaluation ? ?

## 2021-11-11 ENCOUNTER — Other Ambulatory Visit: Payer: Self-pay

## 2021-11-11 ENCOUNTER — Encounter (HOSPITAL_BASED_OUTPATIENT_CLINIC_OR_DEPARTMENT_OTHER): Payer: Self-pay | Admitting: Orthopedic Surgery

## 2021-11-11 ENCOUNTER — Encounter (HOSPITAL_BASED_OUTPATIENT_CLINIC_OR_DEPARTMENT_OTHER)
Admission: RE | Disposition: A | Discharge status: Home / Self Care | Payer: Self-pay | Source: Home / Self Care | Attending: Orthopedic Surgery

## 2021-11-11 ENCOUNTER — Ambulatory Visit (HOSPITAL_BASED_OUTPATIENT_CLINIC_OR_DEPARTMENT_OTHER): Payer: No Typology Code available for payment source | Admitting: Anesthesiology

## 2021-11-11 ENCOUNTER — Ambulatory Visit (HOSPITAL_BASED_OUTPATIENT_CLINIC_OR_DEPARTMENT_OTHER)
Admission: RE | Admit: 2021-11-11 | Discharge: 2021-11-11 | Disposition: A | Discharge status: Home / Self Care | Payer: No Typology Code available for payment source | Attending: Orthopedic Surgery | Admitting: Orthopedic Surgery

## 2021-11-11 DIAGNOSIS — Z955 Presence of coronary angioplasty implant and graft: Secondary | ICD-10-CM | POA: Diagnosis not present

## 2021-11-11 DIAGNOSIS — I10 Essential (primary) hypertension: Secondary | ICD-10-CM

## 2021-11-11 DIAGNOSIS — G5601 Carpal tunnel syndrome, right upper limb: Secondary | ICD-10-CM

## 2021-11-11 DIAGNOSIS — G5603 Carpal tunnel syndrome, bilateral upper limbs: Secondary | ICD-10-CM | POA: Diagnosis not present

## 2021-11-11 DIAGNOSIS — F418 Other specified anxiety disorders: Secondary | ICD-10-CM | POA: Diagnosis not present

## 2021-11-11 DIAGNOSIS — Z7902 Long term (current) use of antithrombotics/antiplatelets: Secondary | ICD-10-CM | POA: Diagnosis not present

## 2021-11-11 DIAGNOSIS — I25119 Atherosclerotic heart disease of native coronary artery with unspecified angina pectoris: Secondary | ICD-10-CM

## 2021-11-11 DIAGNOSIS — E785 Hyperlipidemia, unspecified: Secondary | ICD-10-CM | POA: Diagnosis not present

## 2021-11-11 HISTORY — PX: CARPAL TUNNEL RELEASE: SHX101

## 2021-11-11 HISTORY — DX: Angina pectoris, unspecified: I20.9

## 2021-11-11 HISTORY — DX: Hyperlipidemia, unspecified: E78.5

## 2021-11-11 SURGERY — RELEASE, CARPAL TUNNEL, ENDOSCOPIC
Anesthesia: Monitor Anesthesia Care | Site: Wrist | Laterality: Right

## 2021-11-11 MED ORDER — LIDOCAINE HCL 2 % IJ SOLN
INTRAMUSCULAR | Status: DC | PRN
  Administered 2021-11-11: 5 mL

## 2021-11-11 MED ORDER — ACETAMINOPHEN 500 MG PO TABS
ORAL_TABLET | ORAL | Status: AC
  Filled 2021-11-11: qty 2

## 2021-11-11 MED ORDER — OXYCODONE HCL 5 MG/5ML PO SOLN: 5.0000 mg | Freq: Once | ORAL | Status: DC | PRN

## 2021-11-11 MED ORDER — ACETAMINOPHEN 500 MG PO TABS
1000.0000 mg | ORAL_TABLET | Freq: Once | ORAL | Status: AC
  Administered 2021-11-11: 1000 mg via ORAL

## 2021-11-11 MED ORDER — LIDOCAINE 2% (20 MG/ML) 5 ML SYRINGE
INTRAMUSCULAR | Status: DC | PRN
  Administered 2021-11-11: 60 mg via INTRAVENOUS

## 2021-11-11 MED ORDER — CEFAZOLIN SODIUM-DEXTROSE 2-4 GM/100ML-% IV SOLN
2.0000 g | INTRAVENOUS | Status: AC
  Administered 2021-11-11: 2 g via INTRAVENOUS

## 2021-11-11 MED ORDER — AMISULPRIDE (ANTIEMETIC) 5 MG/2ML IV SOLN: 10.0000 mg | Freq: Once | INTRAVENOUS | Status: DC | PRN

## 2021-11-11 MED ORDER — ACETAMINOPHEN 325 MG PO TABS: 650.0000 mg | ORAL_TABLET | Freq: Four times a day (QID) | ORAL | Status: AC

## 2021-11-11 MED ORDER — CEFAZOLIN SODIUM-DEXTROSE 2-4 GM/100ML-% IV SOLN
INTRAVENOUS | Status: AC
  Filled 2021-11-11: qty 100

## 2021-11-11 MED ORDER — FENTANYL CITRATE (PF) 100 MCG/2ML IJ SOLN
INTRAMUSCULAR | Status: AC
  Filled 2021-11-11: qty 2

## 2021-11-11 MED ORDER — FENTANYL CITRATE (PF) 100 MCG/2ML IJ SOLN
INTRAMUSCULAR | Status: DC | PRN
  Administered 2021-11-11: 50 ug via INTRAVENOUS

## 2021-11-11 MED ORDER — PROPOFOL 500 MG/50ML IV EMUL
INTRAVENOUS | Status: DC | PRN
Start: 2021-11-11 — End: 2021-11-11
  Administered 2021-11-11: 75 ug/kg/min via INTRAVENOUS

## 2021-11-11 MED ORDER — ONDANSETRON HCL 4 MG/2ML IJ SOLN: 4.0000 mg | Freq: Once | INTRAMUSCULAR | Status: DC | PRN

## 2021-11-11 MED ORDER — LACTATED RINGERS IV SOLN: INTRAVENOUS | Status: DC

## 2021-11-11 MED ORDER — BUPIVACAINE-EPINEPHRINE (PF) 0.5% -1:200000 IJ SOLN
INTRAMUSCULAR | Status: AC
  Filled 2021-11-11: qty 30

## 2021-11-11 MED ORDER — MIDAZOLAM HCL 5 MG/5ML IJ SOLN
INTRAMUSCULAR | Status: DC | PRN
Start: 2021-11-11 — End: 2021-11-11
  Administered 2021-11-11: 1 mg via INTRAVENOUS

## 2021-11-11 MED ORDER — ONDANSETRON HCL 4 MG/2ML IJ SOLN
INTRAMUSCULAR | Status: DC | PRN
Start: 2021-11-11 — End: 2021-11-11
  Administered 2021-11-11: 4 mg via INTRAVENOUS

## 2021-11-11 MED ORDER — MIDAZOLAM HCL 2 MG/2ML IJ SOLN
INTRAMUSCULAR | Status: AC
  Filled 2021-11-11: qty 2

## 2021-11-11 MED ORDER — LIDOCAINE HCL 2 % IJ SOLN
INTRAMUSCULAR | Status: AC
  Filled 2021-11-11: qty 20

## 2021-11-11 MED ORDER — OXYCODONE HCL 5 MG PO TABS: 5.0000 mg | ORAL_TABLET | Freq: Once | ORAL | Status: DC | PRN

## 2021-11-11 MED ORDER — BUPIVACAINE-EPINEPHRINE (PF) 0.5% -1:200000 IJ SOLN
INTRAMUSCULAR | Status: DC | PRN
  Administered 2021-11-11: 5 mL

## 2021-11-11 MED ORDER — OXYCODONE HCL 5 MG PO TABS: 5.0000 mg | ORAL_TABLET | Freq: Four times a day (QID) | ORAL | 0 refills | Status: DC | PRN

## 2021-11-11 MED ORDER — FENTANYL CITRATE (PF) 100 MCG/2ML IJ SOLN: 25.0000 ug | INTRAMUSCULAR | Status: DC | PRN

## 2021-11-11 SURGICAL SUPPLY — 42 items
APL PRP STRL LF DISP 70% ISPRP (MISCELLANEOUS) ×1
APL SWBSTK 6 STRL LF DISP (MISCELLANEOUS) ×1
APPLICATOR COTTON TIP 6 STRL (MISCELLANEOUS) ×1 IMPLANT
APPLICATOR COTTON TIP 6IN STRL (MISCELLANEOUS) ×2
BLADE HOOK ENDO STRL (BLADE) ×2 IMPLANT
BLADE SURG 15 STRL LF DISP TIS (BLADE) ×1 IMPLANT
BLADE SURG 15 STRL SS (BLADE) ×2
BLADE TRIANGLE EPF/EGR ENDO (BLADE) ×2 IMPLANT
BNDG CMPR 9X4 STRL LF SNTH (GAUZE/BANDAGES/DRESSINGS) ×1
BNDG COHESIVE 4X5 TAN ST LF (GAUZE/BANDAGES/DRESSINGS) ×2 IMPLANT
BNDG ESMARK 4X9 LF (GAUZE/BANDAGES/DRESSINGS) ×2 IMPLANT
BNDG GAUZE ELAST 4 BULKY (GAUZE/BANDAGES/DRESSINGS) ×2 IMPLANT
CHLORAPREP W/TINT 26 (MISCELLANEOUS) ×2 IMPLANT
CORD BIPOLAR FORCEPS 12FT (ELECTRODE) IMPLANT
COVER BACK TABLE 60X90IN (DRAPES) ×2 IMPLANT
COVER MAYO STAND STRL (DRAPES) ×2 IMPLANT
CUFF TOURN SGL QUICK 18X4 (TOURNIQUET CUFF) IMPLANT
DRAPE EXTREMITY T 121X128X90 (DISPOSABLE) ×2 IMPLANT
DRAPE SURG 17X23 STRL (DRAPES) ×2 IMPLANT
DRSG EMULSION OIL 3X3 NADH (GAUZE/BANDAGES/DRESSINGS) ×2 IMPLANT
GAUZE SPONGE 4X4 12PLY STRL LF (GAUZE/BANDAGES/DRESSINGS) ×2 IMPLANT
GLOVE SRG 8 PF TXTR STRL LF DI (GLOVE) ×1 IMPLANT
GLOVE SURG ENC MOIS LTX SZ7.5 (GLOVE) ×2 IMPLANT
GLOVE SURG LTX SZ6.5 (GLOVE) ×2 IMPLANT
GLOVE SURG UNDER POLY LF SZ7 (GLOVE) ×2 IMPLANT
GLOVE SURG UNDER POLY LF SZ8 (GLOVE) ×2
GOWN STRL REUS W/ TWL LRG LVL3 (GOWN DISPOSABLE) ×2 IMPLANT
GOWN STRL REUS W/TWL LRG LVL3 (GOWN DISPOSABLE) ×4
GOWN STRL REUS W/TWL XL LVL3 (GOWN DISPOSABLE) ×2 IMPLANT
NDL HYPO 25X1 1.5 SAFETY (NEEDLE) IMPLANT
NEEDLE HYPO 25X1 1.5 SAFETY (NEEDLE) IMPLANT
NS IRRIG 1000ML POUR BTL (IV SOLUTION) ×2 IMPLANT
PACK BASIN DAY SURGERY FS (CUSTOM PROCEDURE TRAY) ×2 IMPLANT
PADDING CAST ABS 4INX4YD NS (CAST SUPPLIES)
PADDING CAST ABS COTTON 4X4 ST (CAST SUPPLIES) IMPLANT
STOCKINETTE 6  STRL (DRAPES) ×2
STOCKINETTE 6 STRL (DRAPES) ×1 IMPLANT
SUT VICRYL RAPIDE 4-0 (SUTURE) ×2 IMPLANT
SYR 10ML LL (SYRINGE) IMPLANT
SYR BULB EAR ULCER 3OZ GRN STR (SYRINGE) ×2 IMPLANT
TOWEL GREEN STERILE FF (TOWEL DISPOSABLE) ×4 IMPLANT
UNDERPAD 30X36 HEAVY ABSORB (UNDERPADS AND DIAPERS) ×2 IMPLANT

## 2021-11-11 NOTE — Op Note (Signed)
11/11/2021 ? ?7:32 AM ? ?PATIENT:  Tonya Bryant  71 y.o. female ? ?PRE-OPERATIVE DIAGNOSIS:  RIGHT CARPAL TUNNEL SYNDROME ? ?POST-OPERATIVE DIAGNOSIS:  Same ? ?PROCEDURE:  Procedure(s): ?RIGHT ENDOSCOPIC CARPAL TUNNEL RELEASE ? ?SURGEON:  Surgeon(s): ?Milly Jakob, MD ? ?PHYSICIAN ASSISTANT: Morley Kos, OPA-C ? ?ANESTHESIA:  Local / MAC ? ?SPECIMENS:  None ? ?DRAINS:   none ? ?EBL:  less than 50 mL ? ?PREOPERATIVE INDICATIONS:  Tonya Bryant is a  71 y.o. female with a diagnosis of RIGHT CARPAL TUNNEL SYNDROME who failed conservative measures and elected for surgical management.   ? ?The risks benefits and alternatives were discussed with the patient preoperatively including but not limited to the risks of infection, bleeding, nerve injury, cardiopulmonary complications, the need for revision surgery, among others, and the patient verbalized understanding and consented to proceed. ? ?OPERATIVE IMPLANTS: None ? ?OPERATIVE FINDINGS: Tight carpal tunnel, acceptably decompressed following release ? ?OPERATIVE PROCEDURE:  After receiving prophylactic antibiotics, the patient was escorted to the operative theatre and placed in a supine position.   A surgical ?time-out? was performed during which the planned procedure, proposed operative site, and the correct patient identity were compared to the operative consent and agreement confirmed by the circulating nurse according to current facility policy.  Following application of a tourniquet to the operative extremity, the planned incisions were marked and anesthetized with a mixture of lidocaine & bupivicaine containing epinephrine.  The exposed skin was prepped with Chloraprep and draped in the usual sterile fashion.  The limb was exsanguinated with an Esmarch bandage and the tourniquet inflated to approximately 181mHg higher than systolic BP.  ? ?The incisions were made sharply. Subcutaneous tissues were dissected with blunt and spreading dissection. At the  proximal incision, the deep forearm fascia was split in line with the skin incision the distal edge grasped with a hemostat. At the mid palmar incision, the palmar fascia was split in line with the skin incision, revealing the underlying superficial palmar arch which was visualized with loupe assisted magnification. The synovial reflector was then introduced into the proximal incision and passed through the carpal canal, uses to reflect synovium from the deep surface of the transverse carpal ligament. It was removed and replaced with the slotted cannula and blunt obturator, passing from proximal to distal and exiting the distal wound superficial to the superficial palmar arch. The obturator was removed and the camera inserted. Visualization of the ligament was acceptable. The triangle shape blade was then inserted distally, advanced to the midportion of the ligament, and there used to create a perforation in the ligament. This instrument was removed and the hooked nstrument was inserted. It was placed into the perforation in the ligament and withdrawn distally, completing transection of the distal half of the ligament. The camera was then removed and placed into the distal end of the cannula. The hooked instrument was placed into the proximal end and advanced facility to be placed into the apex of the V. which had been formed to the distal hemi-transection of the ligament. It was withdrawn proximally, completing transection of the ligament. The adequacy of the release was judged with the scope and the instruments used as a probe. All of the endoscopic instruments are removed and the adequacy of release was again judged from the proximal incision perspective with direct loupe assisted visualization. In addition, the proximal forearm fascia was split for 2 inches proximal to the proximal incision under direct visualization using a sliding scissor technique. The tourniquet was  released, the wound copiously irrigated.   The skin was then closed with 4-0 Vicryl Rapide interrupted sutures. A light dressing was applied. ? ?DISPOSITION:  The patient will be discharged home today, returning in 10-15 days for re-assessment.  ?

## 2021-11-11 NOTE — Interval H&P Note (Signed)
History and Physical Interval Note: ? ?11/11/2021 ?7:32 AM ? ?Tonya Bryant  has presented today for surgery, with the diagnosis of RIGHT CARPAL TUNNEL SYNDROME.  The various methods of treatment have been discussed with the patient and family. After consideration of risks, benefits and other options for treatment, the patient has consented to  Procedure(s): ?RIGHT ENDOSCOPIC CARPAL TUNNEL RELEASE (Right) as a surgical intervention.  The patient's history has been reviewed, patient examined, no change in status, stable for surgery.  I have reviewed the patient's chart and labs.  Questions were answered to the patient's satisfaction.   ? ? ?Jodi Marble ? ? ?

## 2021-11-11 NOTE — Transfer of Care (Signed)
Immediate Anesthesia Transfer of Care Note ? ?Patient: Tonya Bryant ? ?Procedure(s) Performed: RIGHT ENDOSCOPIC CARPAL TUNNEL RELEASE (Right: Wrist) ? ?Patient Location: PACU ? ?Anesthesia Type:MAC ? ?Level of Consciousness: awake, alert , oriented and patient cooperative ? ?Airway & Oxygen Therapy: Patient Spontanous Breathing and Patient connected to face mask oxygen ? ?Post-op Assessment: Report given to RN and Post -op Vital signs reviewed and stable ? ?Post vital signs: Reviewed and stable ? ?Last Vitals:  ?Vitals Value Taken Time  ?BP    ?Temp    ?Pulse 61 11/11/21 0805  ?Resp    ?SpO2 96 % 11/11/21 0805  ?Vitals shown include unvalidated device data. ? ?Last Pain:  ?Vitals:  ? 11/11/21 0638  ?TempSrc: Oral  ?PainSc: 0-No pain  ?   ? ?Patients Stated Pain Goal: 4 (11/11/21 5301) ? ?Complications: No notable events documented. ?

## 2021-11-11 NOTE — Anesthesia Postprocedure Evaluation (Signed)
Anesthesia Post Note ? ?Patient: Tonya Bryant ? ?Procedure(s) Performed: RIGHT ENDOSCOPIC CARPAL TUNNEL RELEASE (Right: Wrist) ? ?  ? ?Patient location during evaluation: PACU ?Anesthesia Type: MAC ?Level of consciousness: awake and alert ?Pain management: pain level controlled ?Vital Signs Assessment: post-procedure vital signs reviewed and stable ?Respiratory status: spontaneous breathing, nonlabored ventilation and respiratory function stable ?Cardiovascular status: stable and blood pressure returned to baseline ?Postop Assessment: no apparent nausea or vomiting ?Anesthetic complications: no ? ? ?No notable events documented. ? ?Last Vitals:  ?Vitals:  ? 11/11/21 0815 11/11/21 0830  ?BP: 125/70 (!) 149/81  ?Pulse: (!) 54 (!) 56  ?Resp: 17 14  ?Temp:    ?SpO2: 99% 97%  ?  ?Last Pain:  ?Vitals:  ? 11/11/21 0845  ?TempSrc:   ?PainSc: 0-No pain  ? ? ?  ?  ?  ?  ?  ?  ? ?Merlinda Frederick ? ? ? ? ?

## 2021-11-11 NOTE — Anesthesia Procedure Notes (Signed)
Procedure Name: [place] ?Date/Time: 11/11/2021 7:46 AM ?Performed by: Signe Colt, CRNA ?Pre-anesthesia Checklist: Patient identified, Emergency Drugs available, Suction available, Patient being monitored and Timeout performed ?Patient Re-evaluated:Patient Re-evaluated prior to induction ?Oxygen Delivery Method: Simple face mask ? ? ? ? ?

## 2021-11-11 NOTE — Discharge Instructions (Addendum)
Discharge Instructions ? ? ?You have a light dressing on your hand.  ?You may begin gentle motion of your fingers and hand immediately, but you should not do any heavy lifting or gripping.  Elevate your hand to reduce pain & swelling of the digits.  Ice over the operative site may be helpful to reduce pain & swelling.  DO NOT USE HEAT. ?Pain medicine has been prescribed for you.  ?Take Tylenol 650 mg every 6 hours. Take the prescribed pain medicine additionally for severe post operative pain management as a rescue medicine. ?Leave the dressing in place until the third day after your surgery and then remove it, leaving it open to air.  ?After the bandage has been removed you may shower, regularly washing the incision and letting the water run over it, but not submerging it (no swimming, soaking it in dishwater, etc.) ?You may drive a car when you are off of prescription pain medications and can safely control your vehicle with both hands. ?We will address whether therapy will be required or not when you return to the office. ?You may have already made your follow-up appointment when we completed your preop visit.  If not, please call our office today or the next business day to make your return appointment for 10-15 days after surgery. ? ? ?Please call [phone] during normal business hours or [phone] after hours for any problems. Including the following: ? ?- excessive redness of the incisions ?- drainage for more than 4 days ?- fever of more than 101.5 F ? ?*Please note that pain medications will not be refilled after hours or on weekends.  ? ?WORK STATUS: Patient may return to work on 11/18/21 with no gripping or grasping greater than 2 lbs. ? ?You may have Tylenol again after 12:45pm today, if needed. ? ? ?Post Anesthesia Home Care Instructions ? ?Activity: ?Get plenty of rest for the remainder of the day. A responsible individual must stay with you for 24 hours following the procedure.  ?For the next 24  hours, DO NOT: ?-Drive a car ?-Advertising copywriter ?-Drink alcoholic beverages ?-Take any medication unless instructed by your physician ?-Make any legal decisions or sign important papers. ? ?Meals: ?Start with liquid foods such as gelatin or soup. Progress to regular foods as tolerated. Avoid greasy, spicy, heavy foods. If nausea and/or vomiting occur, drink only clear liquids until the nausea and/or vomiting subsides. Call your physician if vomiting continues. ? ?Special Instructions/Symptoms: ?Your throat may feel dry or sore from the anesthesia or the breathing tube placed in your throat during surgery. If this causes discomfort, gargle with warm salt water. The discomfort should disappear within 24 hours. ? ?If you had a scopolamine patch placed behind your ear for the management of post- operative nausea and/or vomiting: ? ?1. The medication in the patch is effective for 72 hours, after which it should be removed.  Wrap patch in a tissue and discard in the trash. Wash hands thoroughly with soap and water. ?2. You may remove the patch earlier than 72 hours if you experience unpleasant side effects which may include dry mouth, dizziness or visual disturbances. ?3. Avoid touching the patch. Wash your hands with soap and water after contact with the patch. ?    ?

## 2021-11-13 ENCOUNTER — Encounter (HOSPITAL_BASED_OUTPATIENT_CLINIC_OR_DEPARTMENT_OTHER): Payer: Self-pay | Admitting: Orthopedic Surgery

## 2021-11-18 ENCOUNTER — Other Ambulatory Visit: Payer: Self-pay | Admitting: Cardiology

## 2022-01-03 ENCOUNTER — Other Ambulatory Visit: Payer: Self-pay | Admitting: Cardiology

## 2022-01-31 DIAGNOSIS — H40013 Open angle with borderline findings, low risk, bilateral: Secondary | ICD-10-CM | POA: Diagnosis not present

## 2022-03-24 ENCOUNTER — Other Ambulatory Visit: Payer: Self-pay | Admitting: Family Medicine

## 2022-03-24 DIAGNOSIS — Z1231 Encounter for screening mammogram for malignant neoplasm of breast: Secondary | ICD-10-CM

## 2022-03-28 ENCOUNTER — Ambulatory Visit
Admission: RE | Admit: 2022-03-28 | Discharge: 2022-03-28 | Disposition: A | Discharge status: Home / Self Care | Payer: No Typology Code available for payment source | Source: Ambulatory Visit | Attending: Family Medicine | Admitting: Family Medicine

## 2022-03-28 DIAGNOSIS — Z1231 Encounter for screening mammogram for malignant neoplasm of breast: Secondary | ICD-10-CM | POA: Diagnosis not present

## 2022-05-28 DIAGNOSIS — F331 Major depressive disorder, recurrent, moderate: Secondary | ICD-10-CM | POA: Diagnosis not present

## 2022-05-28 DIAGNOSIS — R7301 Impaired fasting glucose: Secondary | ICD-10-CM | POA: Diagnosis not present

## 2022-05-28 DIAGNOSIS — I25119 Atherosclerotic heart disease of native coronary artery with unspecified angina pectoris: Secondary | ICD-10-CM | POA: Diagnosis not present

## 2022-05-28 DIAGNOSIS — Z23 Encounter for immunization: Secondary | ICD-10-CM | POA: Diagnosis not present

## 2022-05-28 DIAGNOSIS — E785 Hyperlipidemia, unspecified: Secondary | ICD-10-CM | POA: Diagnosis not present

## 2022-05-28 DIAGNOSIS — F411 Generalized anxiety disorder: Secondary | ICD-10-CM | POA: Diagnosis not present

## 2022-05-28 DIAGNOSIS — I10 Essential (primary) hypertension: Secondary | ICD-10-CM | POA: Diagnosis not present

## 2022-05-28 DIAGNOSIS — K219 Gastro-esophageal reflux disease without esophagitis: Secondary | ICD-10-CM | POA: Diagnosis not present

## 2022-05-28 DIAGNOSIS — Z0000 Encounter for general adult medical examination without abnormal findings: Secondary | ICD-10-CM | POA: Diagnosis not present

## 2022-07-10 DIAGNOSIS — H40013 Open angle with borderline findings, low risk, bilateral: Secondary | ICD-10-CM | POA: Diagnosis not present

## 2022-07-11 DIAGNOSIS — Z008 Encounter for other general examination: Secondary | ICD-10-CM | POA: Diagnosis not present

## 2022-07-11 DIAGNOSIS — Z6841 Body Mass Index (BMI) 40.0 and over, adult: Secondary | ICD-10-CM | POA: Diagnosis not present

## 2022-07-11 DIAGNOSIS — E785 Hyperlipidemia, unspecified: Secondary | ICD-10-CM | POA: Diagnosis not present

## 2022-07-11 DIAGNOSIS — I25119 Atherosclerotic heart disease of native coronary artery with unspecified angina pectoris: Secondary | ICD-10-CM | POA: Diagnosis not present

## 2022-07-11 DIAGNOSIS — K219 Gastro-esophageal reflux disease without esophagitis: Secondary | ICD-10-CM | POA: Diagnosis not present

## 2022-07-11 DIAGNOSIS — I10 Essential (primary) hypertension: Secondary | ICD-10-CM | POA: Diagnosis not present

## 2022-07-11 DIAGNOSIS — F324 Major depressive disorder, single episode, in partial remission: Secondary | ICD-10-CM | POA: Diagnosis not present

## 2022-07-11 DIAGNOSIS — R32 Unspecified urinary incontinence: Secondary | ICD-10-CM | POA: Diagnosis not present

## 2022-07-14 ENCOUNTER — Ambulatory Visit (HOSPITAL_COMMUNITY)
Admission: EM | Admit: 2022-07-14 | Discharge: 2022-07-14 | Discharge status: Against Medical Advice | Payer: No Typology Code available for payment source

## 2022-11-22 ENCOUNTER — Other Ambulatory Visit: Payer: Self-pay | Admitting: Cardiology

## 2022-12-24 ENCOUNTER — Other Ambulatory Visit: Payer: Self-pay | Admitting: Cardiology

## 2023-01-05 DIAGNOSIS — M19072 Primary osteoarthritis, left ankle and foot: Secondary | ICD-10-CM | POA: Diagnosis not present

## 2023-01-05 DIAGNOSIS — M79662 Pain in left lower leg: Secondary | ICD-10-CM | POA: Diagnosis not present

## 2023-01-12 DIAGNOSIS — H40013 Open angle with borderline findings, low risk, bilateral: Secondary | ICD-10-CM | POA: Diagnosis not present

## 2023-01-27 ENCOUNTER — Other Ambulatory Visit: Payer: Self-pay | Admitting: Cardiology

## 2023-02-19 ENCOUNTER — Encounter: Payer: Self-pay | Admitting: Cardiology

## 2023-02-19 ENCOUNTER — Ambulatory Visit: Payer: No Typology Code available for payment source | Attending: Cardiology | Admitting: Cardiology

## 2023-02-19 VITALS — BP 132/94 | HR 66 | Ht 60.0 in | Wt 209.0 lb

## 2023-02-19 DIAGNOSIS — Z79899 Other long term (current) drug therapy: Secondary | ICD-10-CM | POA: Diagnosis not present

## 2023-02-19 DIAGNOSIS — I25119 Atherosclerotic heart disease of native coronary artery with unspecified angina pectoris: Secondary | ICD-10-CM

## 2023-02-19 DIAGNOSIS — E785 Hyperlipidemia, unspecified: Secondary | ICD-10-CM | POA: Diagnosis not present

## 2023-02-19 DIAGNOSIS — I10 Essential (primary) hypertension: Secondary | ICD-10-CM | POA: Diagnosis not present

## 2023-02-19 MED ORDER — LISINOPRIL 40 MG PO TABS: 40.0000 mg | ORAL_TABLET | Freq: Every day | ORAL | 3 refills | Status: DC

## 2023-02-19 NOTE — Addendum Note (Signed)
Addended by: Luellen Pucker on: 02/19/2023 08:32 AM   Modules accepted: Orders

## 2023-02-19 NOTE — Patient Instructions (Signed)
Medication Instructions:  Please INCREASE your lisinopril dose to 40 mg daily.   *If you need a refill on your cardiac medications before your next appointment, please call your pharmacy*   Lab Work: Please make an appointment to complete a BMET in our lab in one week.  If you have labs (blood work) drawn today and your tests are completely normal, you will receive your results only by: MyChart Message (if you have MyChart) OR A paper copy in the mail If you have any lab test that is abnormal or we need to change your treatment, we will call you to review the results.   Testing/Procedures: None.   Follow-Up: At [facility], you and your health needs are our priority.  As part of our continuing mission to provide you with exceptional heart care, we have created designated Provider Care Teams.  These Care Teams include your primary Cardiologist (physician) and Advanced Practice Providers (APPs -  Physician Assistants and Nurse Practitioners) who all work together to provide you with the care you need, when you need it.  We recommend signing up for the patient portal called "MyChart".  Sign up information is provided on this After Visit Summary.  MyChart is used to connect with patients for Virtual Visits (Telemedicine).  Patients are able to view lab/test results, encounter notes, upcoming appointments, etc.  Non-urgent messages can be sent to your provider as well.   To learn more about what you can do with MyChart, go to [email or web address].    Your next appointment:   1 year(s)  Provider:   Armanda Magic, MD     Other Instructions Please check your blood pressure at home once a day around lunchtime for one week. Then call us [phone]) to submit your readings or send them over MyChart.

## 2023-02-19 NOTE — Progress Notes (Addendum)
Cardiology Office Note:    Date:  02/19/2023   ID:  Tonya Bryant, DOB [birth year], MRN [id number]  PCP:  Blair Heys, MD  Cardiologist:  Armanda Magic, MD    Referring MD: Blair Heys, MD   Chief Complaint  Patient presents with   Coronary Artery Disease   Hypertension   Hyperlipidemia    History of Present Illness:    Tonya Bryant is a 72 y.o. female with a hx of CAD s/p drug-eluting stents in the mid circumflex and distal LAD on 11/01/15. Cath showed residual 65% first diagonal stenosis as well.  She is on long acting nitrates for chronic angina.  She remains on chronic DAPT as well. Nuclear stress test showed no ischemia 09/2016 that was done for CP.  She has GERD and her Protonix was increased to BID but had increased fatigue and went back to daily.      Due to shortness of breath with exertion a Lexiscan Myoview was done 11/11/2021 showing no ischemia and EF 65%.  2D echo at that time showed EF 65 to 70% with mild LVH and G1DD.  She is here today for followup and is doing well.  She has chronic atypical CP that occurs when she is rushing and it has not changed any since a year ago when she had her stress test done.  She has chronic SOB related to obesity that is unchanged from a year ago. She denies any  PND, orthopnea, LE edema, dizziness, palpitations or syncope. She is compliant with her meds and is tolerating meds with no SE.    Past Medical History:  Diagnosis Date   Anginal pain (HCC)    Anxiety    CAD (coronary artery disease) 11/02/2015   s/p DES to dLAD, s/p DES to mLCx in 10/2015 // Myoview 4/19: no ischemia // Echo 4/19: EF 60-65, mod LVH, Gr 1 DD // Echocardiogram 3/23: EF 65-70, no RWMA, mild LVH, Gr 1 DD, GLS -21.1, normal RVSF, normal PASP, RVSP 23.6, mild LAE   Depression (emotion)    Dyslipidemia, goal LDL below 70 02/07/2016   Full dentures    GERD (gastroesophageal reflux disease)    Hyperlipidemia    Hypertension     Past Surgical History:   Procedure Laterality Date   BREAST EXCISIONAL BIOPSY Right    BREAST SURGERY     lt br bx-neg   CARDIAC CATHETERIZATION N/A 11/01/2015   Procedure: Left Heart Cath and Coronary Angiography;  Surgeon: Tonny Bollman, MD;  Location: [facility] INVASIVE CV LAB;  Service: Cardiovascular;  Laterality: N/A;   CARPAL TUNNEL RELEASE Left 07/07/2013   Procedure: LEFT CARPAL TUNNEL RELEASE;  Surgeon: Wyn Forster., MD;  Location: [facility];  Service: Orthopedics;  Laterality: Left;   CARPAL TUNNEL RELEASE Right 11/11/2021   Procedure: RIGHT ENDOSCOPIC CARPAL TUNNEL RELEASE;  Surgeon: Mack Hook, MD;  Location: [facility];  Service: Orthopedics;  Laterality: Right;   COLONOSCOPY     TOE OSTEOTOMY     rt big toe   TOOTH EXTRACTION     TRIGGER FINGER RELEASE Left 07/07/2013   Procedure: LEFT THUMB RELEASE A-1 PULLEY;  Surgeon: Wyn Forster., MD;  Location: [facility];  Service: Orthopedics;  Laterality: Left;   TUBAL LIGATION      Current Medications: Current Meds  Medication Sig   acetaminophen (TYLENOL) 325 MG tablet Take 2 tablets (650 mg total) by mouth every 6 (six) hours.  ALPRAZolam (XANAX) 0.25 MG tablet Take 1/2-One whole tablet by mouth daily   aspirin 81 MG tablet Take 1 tablet (81 mg total) by mouth daily.   citalopram (CELEXA) 40 MG tablet Take 20 mg by mouth daily.   clopidogrel (PLAVIX) 75 MG tablet Take 1 tablet (75 mg total) by mouth daily.   diclofenac (VOLTAREN) 75 MG EC tablet TAKE 1 TABLET BY MOUTH TWICE DAILY WITH FOOD AS NEEDED FOR PAIN AND FOR INFLAMMATION   isosorbide mononitrate (IMDUR) 60 MG 24 hr tablet TAKE 1 TO 1 & 1/2 (ONE & ONE-HALF) TABLETS BY MOUTH ONCE DAILY   lisinopril (PRINIVIL,ZESTRIL) 20 MG tablet Take 20 mg by mouth daily.   nitroGLYCERIN (NITROSTAT) 0.4 MG SL tablet PLACE 1 TABLET UNDER THE TONGUE EVERY 5 MINUTES AS NEEDED FOR CHEST PAIN(3 DOSES ONLY)   pantoprazole (PROTONIX) 40 MG tablet Take 40 mg by  mouth daily. Heart burn   rosuvastatin (CRESTOR) 40 MG tablet Take 1 tablet by mouth once daily     Allergies:   Patient has no known allergies.   Social History   Socioeconomic History   Marital status: Single    Spouse name: Not on file   Number of children: Not on file   Years of education: Not on file   Highest education level: Not on file  Occupational History   Not on file  Tobacco Use   Smoking status: Never   Smokeless tobacco: Never  Vaping Use   Vaping Use: Never used  Substance and Sexual Activity   Alcohol use: Yes    Alcohol/week: 1.0 standard drink of alcohol    Types: 1 Standard drinks or equivalent per week    Comment: socially   Drug use: No   Sexual activity: Not Currently    Birth control/protection: None  Other Topics Concern   Not on file  Social History Narrative   Not on file   Social Determinants of Health   Financial Resource Strain: Not on file  Food Insecurity: Not on file  Transportation Needs: Not on file  Physical Activity: Not on file  Stress: Not on file  Social Connections: Not on file     Family History: The patient's family history includes Cancer in her mother; Diabetes in her father and mother; Gout in her father; Hypertension in her father and mother; Stroke in her father and mother.  ROS:   Please see the history of present illness.    ROS  All other systems reviewed and negative.   EKGs/Labs/Other Studies Reviewed:    The following studies were reviewed today: Outside labs on [facility]  EKG Interpretation  Date/Time:  Thursday February 19 2023 08:34:22 EDT Ventricular Rate:  55 PR Interval:  172 QRS Duration: 74 QT Interval:  430 QTC Calculation: 411 R Axis:   -6 Text Interpretation: Sinus bradycardia Minimal voltage criteria for LVH, may be normal variant ( R in aVL ) Nonspecific T wave abnormality Confirmed by Armanda Magic ([id number]) on 02/19/2023 8:41:09 AM    Recent Labs: No results found for requested labs within  last 365 days.   Recent Lipid Panel    Component Value Date/Time   CHOL 148 10/19/2018 0800   TRIG 45 10/19/2018 0800   HDL 77 10/19/2018 0800   CHOLHDL 1.9 10/19/2018 0800   CHOLHDL 2.0 12/12/2017 0455   VLDL 11 12/12/2017 0455   LDLCALC 62 10/19/2018 0800    Physical Exam:    VS:  BP (!) 154/98   Pulse 66  Ht 5' (1.524 m)   Wt 209 lb (94.8 kg)   SpO2 96%   BMI 40.82 kg/m     Wt Readings from Last 3 Encounters:  02/19/23 209 lb (94.8 kg)  11/11/21 212 lb 11.9 oz (96.5 kg)  10/31/21 213 lb (96.6 kg)     GEN: Well nourished, well developed in no acute distress HEENT: Normal NECK: No JVD; No carotid bruits LYMPHATICS: No lymphadenopathy CARDIAC:RRR, no murmurs, rubs, gallops RESPIRATORY:  Clear to auscultation without rales, wheezing or rhonchi  ABDOMEN: Soft, non-tender, non-distended MUSCULOSKELETAL:  No edema; No deformity  SKIN: Warm and dry NEUROLOGIC:  Alert and oriented x 3 PSYCHIATRIC:  Normal affect  ASSESSMENT:    1. Coronary artery disease involving native coronary artery of native heart with angina pectoris (HCC)   2. Essential hypertension   3. Dyslipidemia, goal LDL below 70    PLAN:    In order of problems listed above:  1.  ASCAD -s/p drug-eluting stents in the mid circumflex and distal LAD on 11/01/15 with residual 65% first diagonal stenosis as well.  -nuclear stress test in 2023 -She has not had any anginal symptoms since I saw her last -Continue prescription drug management with aspirin 81 mg daily, Plavix 75 mg daily, Imdur 90 mg daily, Crestor 40 mg daily with as needed refills  2.  HTN -BP is elevated on exam today>>she stopped taking her amlodipine because she could not remember to take it -Continue prescription drug management with Imdur 90 mg daily with as needed refills -increase Lisinopril to 40mg  daily and repeat BMET in 1 week -I have asked her to check her BP daily for a week and call with results -I have personally reviewed  and interpreted outside labs performed by patient's PCP which showed SCr 0.83 and K+ 4.5 on 05/28/22  3.  HLD  -LDL goal was < 70 -I have personally reviewed and interpreted outside labs performed by patient's PCP which showed  LDL 48 and HDL 72 on 05/28/2022 -Continue prescription drug with Crestor 40 mg daily with as needed refills   Medication Adjustments/Labs and Tests Ordered: Current medicines are reviewed at length with the patient today.  Concerns regarding medicines are outlined above.  Orders Placed This Encounter  Procedures   EKG 12-Lead   Followup with me in 1 year  No orders of the defined types were placed in this encounter.   Signed, Armanda Magic, MD  02/19/2023 8:11 AM    [facility]

## 2023-02-23 ENCOUNTER — Other Ambulatory Visit: Payer: Self-pay | Admitting: Cardiology

## 2023-02-25 ENCOUNTER — Emergency Department (HOSPITAL_COMMUNITY)
Admission: EM | Admit: 2023-02-25 | Discharge: 2023-02-25 | Disposition: A | Discharge status: Home / Self Care | Payer: No Typology Code available for payment source | Attending: Emergency Medicine | Admitting: Emergency Medicine

## 2023-02-25 ENCOUNTER — Other Ambulatory Visit: Payer: Self-pay

## 2023-02-25 ENCOUNTER — Ambulatory Visit: Payer: No Typology Code available for payment source | Attending: Cardiology

## 2023-02-25 ENCOUNTER — Encounter (HOSPITAL_COMMUNITY): Payer: Self-pay | Admitting: Emergency Medicine

## 2023-02-25 ENCOUNTER — Emergency Department (HOSPITAL_COMMUNITY): Payer: No Typology Code available for payment source

## 2023-02-25 ENCOUNTER — Encounter (HOSPITAL_COMMUNITY): Payer: Self-pay

## 2023-02-25 ENCOUNTER — Ambulatory Visit (HOSPITAL_COMMUNITY)
Admission: EM | Admit: 2023-02-25 | Discharge: 2023-02-25 | Disposition: A | Discharge status: Home / Self Care | Payer: No Typology Code available for payment source | Source: Home / Self Care | Admitting: Emergency Medicine

## 2023-02-25 ENCOUNTER — Telehealth: Payer: Self-pay

## 2023-02-25 DIAGNOSIS — M47814 Spondylosis without myelopathy or radiculopathy, thoracic region: Secondary | ICD-10-CM | POA: Diagnosis not present

## 2023-02-25 DIAGNOSIS — R519 Headache, unspecified: Secondary | ICD-10-CM | POA: Diagnosis present

## 2023-02-25 DIAGNOSIS — Z79899 Other long term (current) drug therapy: Secondary | ICD-10-CM | POA: Insufficient documentation

## 2023-02-25 DIAGNOSIS — Z7982 Long term (current) use of aspirin: Secondary | ICD-10-CM | POA: Diagnosis not present

## 2023-02-25 DIAGNOSIS — E785 Hyperlipidemia, unspecified: Secondary | ICD-10-CM | POA: Diagnosis not present

## 2023-02-25 DIAGNOSIS — R0602 Shortness of breath: Secondary | ICD-10-CM | POA: Insufficient documentation

## 2023-02-25 DIAGNOSIS — I10 Essential (primary) hypertension: Secondary | ICD-10-CM | POA: Insufficient documentation

## 2023-02-25 DIAGNOSIS — I160 Hypertensive urgency: Secondary | ICD-10-CM

## 2023-02-25 DIAGNOSIS — Z7902 Long term (current) use of antithrombotics/antiplatelets: Secondary | ICD-10-CM | POA: Diagnosis not present

## 2023-02-25 LAB — CBC WITH DIFFERENTIAL/PLATELET
Abs Immature Granulocytes: 0.01 10*3/uL (ref 0.00–0.07)
Basophils Absolute: 0.1 10*3/uL (ref 0.0–0.1)
Basophils Relative: 1 %
Eosinophils Absolute: 0.3 10*3/uL (ref 0.0–0.5)
Eosinophils Relative: 6 %
HCT: 37.8 % (ref 36.0–46.0)
Hemoglobin: 12.2 g/dL (ref 12.0–15.0)
Immature Granulocytes: 0 %
Lymphocytes Relative: 31 %
Lymphs Abs: 1.5 10*3/uL (ref 0.7–4.0)
MCH: 31.4 pg (ref 26.0–34.0)
MCHC: 32.3 g/dL (ref 30.0–36.0)
MCV: 97.4 fL (ref 80.0–100.0)
Monocytes Absolute: 0.5 10*3/uL (ref 0.1–1.0)
Monocytes Relative: 11 %
Neutro Abs: 2.3 10*3/uL (ref 1.7–7.7)
Neutrophils Relative %: 51 %
Platelets: 226 10*3/uL (ref 150–400)
RBC: 3.88 MIL/uL (ref 3.87–5.11)
RDW: 12.7 % (ref 11.5–15.5)
WBC: 4.7 10*3/uL (ref 4.0–10.5)
nRBC: 0 % (ref 0.0–0.2)

## 2023-02-25 LAB — BASIC METABOLIC PANEL
Anion gap: 9 (ref 5–15)
BUN/Creatinine Ratio: 13 (ref 12–28)
BUN: 14 mg/dL (ref 8–27)
BUN: 14 mg/dL (ref 8–23)
CO2: 22 mmol/L (ref 20–29)
CO2: 24 mmol/L (ref 22–32)
Calcium: 9.2 mg/dL (ref 8.7–10.3)
Calcium: 8.9 mg/dL (ref 8.9–10.3)
Chloride: 105 mmol/L (ref 96–106)
Chloride: 105 mmol/L (ref 98–111)
Creatinine, Ser: 1.05 mg/dL — ABNORMAL HIGH (ref 0.57–1.00)
Creatinine, Ser: 1.03 mg/dL — ABNORMAL HIGH (ref 0.44–1.00)
GFR, Estimated: 58 mL/min — ABNORMAL LOW (ref >60)
Glucose, Bld: 106 mg/dL — ABNORMAL HIGH (ref 70–99)
Glucose: 92 mg/dL (ref 70–99)
Potassium: 4.8 mmol/L (ref 3.5–5.2)
Potassium: 4.2 mmol/L (ref 3.5–5.1)
Sodium: 141 mmol/L (ref 134–144)
Sodium: 138 mmol/L (ref 135–145)
eGFR: 57 mL/min/{1.73_m2} — ABNORMAL LOW (ref >59)

## 2023-02-25 LAB — TROPONIN I (HIGH SENSITIVITY)
Troponin I (High Sensitivity): 7 ng/L (ref <18)
Troponin I (High Sensitivity): 7 ng/L (ref <18)

## 2023-02-25 MED ORDER — ACETAMINOPHEN 500 MG PO TABS
1000.0000 mg | ORAL_TABLET | Freq: Once | ORAL | Status: AC
Start: 2023-02-25 — End: 2023-02-25
  Administered 2023-02-25: 1000 mg via ORAL
  Filled 2023-02-25: qty 2

## 2023-02-25 MED ORDER — AMLODIPINE BESYLATE 5 MG PO TABS
10.0000 mg | ORAL_TABLET | Freq: Once | ORAL | Status: AC
  Administered 2023-02-25: 10 mg via ORAL
  Filled 2023-02-25: qty 2

## 2023-02-25 MED ORDER — AMLODIPINE BESYLATE 10 MG PO TABS: 10.0000 mg | ORAL_TABLET | Freq: Every day | ORAL | 0 refills | Status: DC

## 2023-02-25 NOTE — ED Triage Notes (Signed)
Pt c/o headache x3 days and tightness in shoulders starting today.  Pain score 8/10.  Pt reports increased BP.  210/105 at home.

## 2023-02-25 NOTE — Discharge Instructions (Signed)
Return for any problem.  Contact Dr. Norris Cross office on Monday for further evaluation of your elevated blood pressure.  Continue taking Lisinopril and Imdur as previously prescribed.  As per recommendation from cardiology, please take your amlodipine (Norvasc) as prescribed.

## 2023-02-25 NOTE — ED Notes (Signed)
Patient is being discharged from the Urgent Care and sent to the Emergency Department via self . Per provider, patient is in need of higher level of care due to hypertension. Patient is aware and verbalizes understanding of plan of care.  Vitals:   02/25/23 0824  BP: (!) 193/97  Pulse: 70  Resp: 18  Temp: 98 F (36.7 C)  SpO2: 94%

## 2023-02-25 NOTE — ED Triage Notes (Signed)
Pt reports past 2 days BP been elevated. Recently had medications increased for HTN. 210/105 today.

## 2023-02-25 NOTE — ED Provider Notes (Signed)
[facility] EMERGENCY DEPARTMENT AT [facility] Provider Note   CSN: [id number] Arrival date & time: 02/25/23  4098     History  Chief Complaint  Patient presents with   Hypertension   Headache    Tonya Bryant is a 72 y.o. female.  72 year old female with prior history as detailed below presents for evaluation.  Patient reports elevated blood pressure.  Patient presents from urgent care for evaluation.  Patient is known to Dr. Mayford Knife with cardiology.  She was seen on the 27th (last week) in clinic.  Patient was noted to be hypertensive secondary to not taking her amlodipine as prescribed.  Dr. Mayford Knife advised patient to continue taking Imdur 90 mg daily and to also take lisinopril 40 mg daily.  Patient was to get repeat BMP this morning.  Patient reported to lab staff this a.m. that her blood pressure was elevated and she had mild headache and some tightness in her shoulders.  Patient was sent to urgent care who then referred the patient to the ED for evaluation.  Patient denies chest pain.  Patient reports blood pressure is as high as 210/105 at home.  Blood pressure on evaluation is 186/79.  Patient complains of mild headache.  She is otherwise without specific complaint at time of evaluation.  She reports compliance with lisinopril and Imdur.  She took her a.m. doses of both this morning.  The history is provided by the patient and medical records.       Home Medications Prior to Admission medications   Medication Sig Start Date End Date Taking? Authorizing Provider  acetaminophen (TYLENOL) 325 MG tablet Take 2 tablets (650 mg total) by mouth every 6 (six) hours. 11/11/21   Mack Hook, MD  ALPRAZolam Prudy Feeler) 0.25 MG tablet Take 1/2-One whole tablet by mouth daily 06/11/21   [provider]  aspirin 81 MG tablet Take 1 tablet (81 mg total) by mouth daily. 11/02/15   Ghimire, Werner Lean, MD  citalopram (CELEXA) 40 MG tablet Take 20 mg by mouth daily.     [provider]  clopidogrel (PLAVIX) 75 MG tablet Take 1 tablet by mouth once daily 02/23/23   Turner, Cornelious Bryant, MD  diclofenac (VOLTAREN) 75 MG EC tablet TAKE 1 TABLET BY MOUTH TWICE DAILY WITH FOOD AS NEEDED FOR PAIN AND FOR INFLAMMATION 02/12/19   [provider]  isosorbide mononitrate (IMDUR) 60 MG 24 hr tablet TAKE 1 TO 1 & 1/2 (ONE & ONE-HALF) TABLETS BY MOUTH ONCE DAILY 11/25/22   Quintella Reichert, MD  lisinopril (ZESTRIL) 40 MG tablet Take 1 tablet (40 mg total) by mouth daily. 02/19/23 05/20/23  Quintella Reichert, MD  nitroGLYCERIN (NITROSTAT) 0.4 MG SL tablet PLACE 1 TABLET UNDER THE TONGUE EVERY 5 MINUTES AS NEEDED FOR CHEST PAIN(3 DOSES ONLY) 07/29/21   Quintella Reichert, MD  pantoprazole (PROTONIX) 40 MG tablet Take 40 mg by mouth daily. Heart burn    [provider]  rosuvastatin (CRESTOR) 40 MG tablet Take 1 tablet by mouth once daily 04/30/21   Quintella Reichert, MD      Allergies    Patient has no known allergies.    Review of Systems   Review of Systems  All other systems reviewed and are negative.   Physical Exam Updated Vital Signs BP (!) 186/79   Pulse 70   Temp 97.8 F (36.6 C) (Oral)   Resp 14   Ht 5' (1.524 m)   Wt 94.8 kg  SpO2 96%   BMI 40.82 kg/m  Physical Exam Vitals and nursing note reviewed.  Constitutional:      General: She is not in acute distress.    Appearance: Normal appearance. She is well-developed.  HENT:     Head: Normocephalic and atraumatic.  Eyes:     General: No visual field deficit.    Conjunctiva/sclera: Conjunctivae normal.     Pupils: Pupils are equal, round, and reactive to light.  Cardiovascular:     Rate and Rhythm: Normal rate and regular rhythm.     Heart sounds: Normal heart sounds.  Pulmonary:     Effort: Pulmonary effort is normal. No respiratory distress.     Breath sounds: Normal breath sounds.  Abdominal:     General: There is no distension.     Palpations: Abdomen is soft.     Tenderness: There  is no abdominal tenderness.  Musculoskeletal:        General: No deformity. Normal range of motion.     Cervical back: Normal range of motion and neck supple.  Skin:    General: Skin is warm and dry.  Neurological:     General: No focal deficit present.     Mental Status: She is alert and oriented to person, place, and time.     GCS: GCS eye subscore is 4. GCS verbal subscore is 5. GCS motor subscore is 6.     Cranial Nerves: No cranial nerve deficit, dysarthria or facial asymmetry.     Sensory: No sensory deficit.     Motor: No weakness.     Coordination: Romberg sign negative.     Gait: Gait normal.     ED Results / Procedures / Treatments   Labs (all labs ordered are listed, but only abnormal results are displayed) Labs Reviewed  CBC WITH DIFFERENTIAL/PLATELET  BASIC METABOLIC PANEL  TROPONIN I (HIGH SENSITIVITY)    EKG None  Radiology No results found.  Procedures Procedures    Medications Ordered in ED Medications  acetaminophen (TYLENOL) tablet 1,000 mg (has no administration in time range)    ED Course/ Medical Decision Making/ A&P                             Medical Decision Making Amount and/or Complexity of Data Reviewed Labs: ordered. Radiology: ordered.  Risk OTC drugs. Prescription drug management.    Medical Screen Complete  This patient presented to the ED with complaint of high blood pressure.  This complaint involves an extensive number of treatment options. The initial differential diagnosis includes, but is not limited to, hypertension, metabolic abnormality, etc  This presentation is: Acute, Chronic, Self-Limited, Previously Undiagnosed, Uncertain Prognosis, Complicated, and Systemic Symptoms  Patient with established history of hypertension well-known to Dr. Mayford Knife with cardiology.  Patient with recent adjustment in her blood pressure medications last week.  Patient was started on lisinopril after having trouble taking her  amlodipine.  Patient was continued on Imdur.  Patient with elevated blood pressures over the last several days with systolics as high as 200.  Patient without objective evidence of endorgan damage here in the ED.  She feels improved with blood pressures averaging around 170 systolic.  Case discussed briefly with Dr. Clifton James (Dr. Mayford Knife was not in the office).  He recommended advising the patient to restart amlodipine.  He recommended that she take this in the morning so that she would be compliant with same.  Patient understands  need to take amlodipine along with lisinopril and Imdur.  Patient educated and warned about possible hypotension and symptoms related to same.  Patient is advised to record and check her blood pressure over the next several days.  Patient is comfortable with plan for discharge with close follow-up advised early next week with Dr. Norris Cross office.  Importance of close follow-up stressed.  Strict return precautions given and understood.   Additional history obtained: External records from outside sources obtained and reviewed including prior ED visits and prior Inpatient records.    Lab Tests:  I ordered and personally interpreted labs.   Imaging Studies ordered:  I ordered imaging studies including chest x-ray I independently visualized and interpreted obtained imaging which showed NAD I agree with the radiologist interpretation.   Cardiac Monitoring:  The patient was maintained on a cardiac monitor.  I personally viewed and interpreted the cardiac monitor which showed an underlying rhythm of: NSR   Medicines ordered:  I ordered medication including amlodipine for hypertension Reevaluation of the patient after these medicines showed that the patient: improved    Problem List / ED Course:  Hypertension   Reevaluation:  After the interventions noted above, I reevaluated the patient and found that they have: improved  Disposition:  After  consideration of the diagnostic results and the patients response to treatment, I feel that the patent would benefit from close outpatient follow-up.          Final Clinical Impression(s) / ED Diagnoses Final diagnoses:  Hypertension, unspecified type    Rx / DC Orders ED Discharge Orders          Ordered    amLODipine (NORVASC) 10 MG tablet  Daily        02/25/23 1232              Wynetta Fines, MD 02/25/23 1702

## 2023-02-25 NOTE — ED Provider Notes (Signed)
[facility]    CSN: [id number] Arrival date & time: 02/25/23  0813      History   Chief Complaint Chief Complaint  Patient presents with   Hypertension    HPI Tonya Bryant is a 72 y.o. female.   Patient presents to clinic with concerns of hypertension.  She checked her blood pressure this morning and it was 210/105.  She thought she initially had a cold coming on over the past few days, she had chest tightness across her shoulder blades, headache and dizziness for the past few days.  She then took her blood pressure medications around 10 to 15 minutes later.  She did have a recent adjustment of her blood pressure medication, reports one of her providers recently increased her medications on 27 June.  She denies any current chest pain, does endorse shortness of breath.  She is ambulatory.  She drove herself to the clinic, was going to try and work this morning.    The history is provided by the patient and medical records.  Hypertension Associated symptoms include headaches and shortness of breath. Pertinent negatives include no chest pain.    Past Medical History:  Diagnosis Date   Anginal pain (HCC)    Anxiety    CAD (coronary artery disease) 11/02/2015   s/p DES to dLAD, s/p DES to mLCx in 10/2015 // Myoview 4/19: no ischemia // Echo 4/19: EF 60-65, mod LVH, Gr 1 DD // Echocardiogram 3/23: EF 65-70, no RWMA, mild LVH, Gr 1 DD, GLS -21.1, normal RVSF, normal PASP, RVSP 23.6, mild LAE   Depression (emotion)    Dyslipidemia, goal LDL below 70 02/07/2016   Full dentures    GERD (gastroesophageal reflux disease)    Hyperlipidemia    Hypertension     Patient Active Problem List   Diagnosis Date Noted   Shortness of breath 10/23/2021   Preoperative cardiovascular examination 10/22/2021   GERD (gastroesophageal reflux disease) 12/11/2017   Depression 12/11/2017   Edema extremities 07/13/2017   Dyslipidemia, goal LDL below 70 02/07/2016   Coronary artery  disease involving native coronary artery of native heart with angina pectoris (HCC) 11/02/2015   Essential hypertension 10/31/2015   Bradycardia 10/31/2015   Obesity 10/31/2015   Ingrown right greater toenail 04/27/2013    Past Surgical History:  Procedure Laterality Date   BREAST EXCISIONAL BIOPSY Right    BREAST SURGERY     lt br bx-neg   CARDIAC CATHETERIZATION N/A 11/01/2015   Procedure: Left Heart Cath and Coronary Angiography;  Surgeon: Tonny Bollman, MD;  Location: [facility] INVASIVE CV LAB;  Service: Cardiovascular;  Laterality: N/A;   CARPAL TUNNEL RELEASE Left 07/07/2013   Procedure: LEFT CARPAL TUNNEL RELEASE;  Surgeon: Wyn Forster., MD;  Location: [facility];  Service: Orthopedics;  Laterality: Left;   CARPAL TUNNEL RELEASE Right 11/11/2021   Procedure: RIGHT ENDOSCOPIC CARPAL TUNNEL RELEASE;  Surgeon: Mack Hook, MD;  Location: [facility];  Service: Orthopedics;  Laterality: Right;   COLONOSCOPY     TOE OSTEOTOMY     rt big toe   TOOTH EXTRACTION     TRIGGER FINGER RELEASE Left 07/07/2013   Procedure: LEFT THUMB RELEASE A-1 PULLEY;  Surgeon: Wyn Forster., MD;  Location: [facility];  Service: Orthopedics;  Laterality: Left;   TUBAL LIGATION      OB History   No obstetric history on file.      Home Medications  Prior to Admission medications   Medication Sig Start Date End Date Taking? Authorizing Provider  acetaminophen (TYLENOL) 325 MG tablet Take 2 tablets (650 mg total) by mouth every 6 (six) hours. 11/11/21   Mack Hook, MD  ALPRAZolam Prudy Feeler) 0.25 MG tablet Take 1/2-One whole tablet by mouth daily 06/11/21   [provider]  aspirin 81 MG tablet Take 1 tablet (81 mg total) by mouth daily. 11/02/15   Ghimire, Werner Lean, MD  citalopram (CELEXA) 40 MG tablet Take 20 mg by mouth daily.    [provider]  clopidogrel (PLAVIX) 75 MG tablet Take 1 tablet by mouth once daily 02/23/23    Turner, Cornelious Bryant, MD  diclofenac (VOLTAREN) 75 MG EC tablet TAKE 1 TABLET BY MOUTH TWICE DAILY WITH FOOD AS NEEDED FOR PAIN AND FOR INFLAMMATION 02/12/19   [provider]  isosorbide mononitrate (IMDUR) 60 MG 24 hr tablet TAKE 1 TO 1 & 1/2 (ONE & ONE-HALF) TABLETS BY MOUTH ONCE DAILY 11/25/22   Quintella Reichert, MD  lisinopril (ZESTRIL) 40 MG tablet Take 1 tablet (40 mg total) by mouth daily. 02/19/23 05/20/23  Quintella Reichert, MD  nitroGLYCERIN (NITROSTAT) 0.4 MG SL tablet PLACE 1 TABLET UNDER THE TONGUE EVERY 5 MINUTES AS NEEDED FOR CHEST PAIN(3 DOSES ONLY) 07/29/21   Quintella Reichert, MD  pantoprazole (PROTONIX) 40 MG tablet Take 40 mg by mouth daily. Heart burn    [provider]  rosuvastatin (CRESTOR) 40 MG tablet Take 1 tablet by mouth once daily 04/30/21   Quintella Reichert, MD    Family History Family History  Problem Relation Age of Onset   Cancer Mother    Diabetes Mother    Stroke Mother    Hypertension Mother    Stroke Father    Diabetes Father    Hypertension Father    Gout Father     Social History Social History   Tobacco Use   Smoking status: Never   Smokeless tobacco: Never  Vaping Use   Vaping Use: Never used  Substance Use Topics   Alcohol use: Yes    Alcohol/week: 1.0 standard drink of alcohol    Types: 1 Standard drinks or equivalent per week    Comment: socially   Drug use: No     Allergies   Patient has no known allergies.   Review of Systems Review of Systems  Eyes:  Positive for visual disturbance.  Respiratory:  Positive for shortness of breath.   Cardiovascular:  Negative for chest pain.  Neurological:  Positive for headaches.     Physical Exam Triage Vital Signs ED Triage Vitals  Enc Vitals Group     BP 02/25/23 0824 (!) 193/97     Pulse Rate 02/25/23 0824 70     Resp 02/25/23 0824 18     Temp 02/25/23 0824 98 F (36.7 C)     Temp Source 02/25/23 0824 Oral     SpO2 02/25/23 0824 94 %     Weight --      Height --       Head Circumference --      Peak Flow --      Pain Score 02/25/23 0822 8     Pain Loc --      Pain Edu? --      Excl. in GC? --    No data found.  Updated Vital Signs BP (!) 193/97 (BP Location: Right Arm)   Pulse 70   Temp 98  F (36.7 C) (Oral)   Resp 18   SpO2 94%   Visual Acuity Right Eye Distance:   Left Eye Distance:   Bilateral Distance:    Right Eye Near:   Left Eye Near:    Bilateral Near:     Physical Exam Vitals and nursing note reviewed.  Constitutional:      Appearance: Normal appearance.  HENT:     Head: Normocephalic and atraumatic.     Right Ear: External ear normal.     Left Ear: External ear normal.     Mouth/Throat:     Mouth: Mucous membranes are moist.  Eyes:     General: No scleral icterus. Cardiovascular:     Rate and Rhythm: Normal rate.  Pulmonary:     Effort: Pulmonary effort is normal. No respiratory distress.  Musculoskeletal:        General: Normal range of motion.  Neurological:     General: No focal deficit present.     Mental Status: She is alert and oriented to person, place, and time.  Psychiatric:        Mood and Affect: Mood normal.      UC Treatments / Results  Labs (all labs ordered are listed, but only abnormal results are displayed) Labs Reviewed - No data to display  EKG   Radiology No results found.  Procedures Procedures (including critical care time)  Medications Ordered in UC Medications - No data to display  Initial Impression / Assessment and Plan / UC Course  I have reviewed the triage vital signs and the nursing notes.  Pertinent labs & imaging results that were available during my care of the patient were reviewed by me and considered in my medical decision making (see chart for details).  Vitals and triage reviewed, patient is hypertensive in clinic despite taking her daily hypertensive regimen.  She is symptomatic with headache, dizziness, and shortness of breath.  Denies any active chest  pain.  She is alert and oriented, GCS 15 . Discussed that she would benefit from further evaluation at the emergency department due to her hypertensive urgency/emergency.  Patient verbalized understanding, prefers to drive herself down via POV instead of ambulance.  Patient will head to [facility] emergency department.     Final Clinical Impressions(s) / UC Diagnoses   Final diagnoses:  Hypertensive urgency   Discharge Instructions   None    ED Prescriptions   None    PDMP not reviewed this encounter.   Rinaldo Ratel [place], [place] 02/25/23 [phone]

## 2023-02-25 NOTE — Telephone Encounter (Signed)
Patient presenting for lab appt and c/o elevated BP's and asking to be seen. She reports that her BP was 201/105 at home today before taking her medications. She reports that she did not have any symptoms at the time but feels a little dizzy now. She reports that her bp readings seem to have been escalating over the past few days:  02/21/23: 169/81 12:40 pm 02/23/23: 170/90 before AM meds 02/24/23: 179/102 before AM meds 02/25/23: 210/105 before AM meds  Advised patient to proceed to urgent care across the street to have bp checked as she was symptomatic. Patient verbalized understanding and agreed to plan.

## 2023-02-26 ENCOUNTER — Other Ambulatory Visit: Payer: Self-pay | Admitting: Cardiology

## 2023-02-27 ENCOUNTER — Telehealth: Payer: Self-pay

## 2023-02-27 NOTE — Telephone Encounter (Signed)
Call to patient, no answer, left detailed message per DPR asking patient to call our office to schedule follow up for her ED visit for hypertension. I advised her that she can schedule an appointment with Dr. Mayford Knife or one of her APP's.

## 2023-03-06 NOTE — Telephone Encounter (Signed)
Scheduled hospital f/u w/ T. Conte for 03/24/23.

## 2023-03-24 ENCOUNTER — Ambulatory Visit: Payer: No Typology Code available for payment source | Attending: Physician Assistant | Admitting: Physician Assistant

## 2023-03-24 ENCOUNTER — Encounter: Payer: Self-pay | Admitting: Physician Assistant

## 2023-03-24 VITALS — BP 136/68 | HR 66 | Ht 61.0 in | Wt 210.6 lb

## 2023-03-24 DIAGNOSIS — Z79899 Other long term (current) drug therapy: Secondary | ICD-10-CM

## 2023-03-24 DIAGNOSIS — Z01810 Encounter for preprocedural cardiovascular examination: Secondary | ICD-10-CM

## 2023-03-24 DIAGNOSIS — R0602 Shortness of breath: Secondary | ICD-10-CM | POA: Diagnosis not present

## 2023-03-24 DIAGNOSIS — I10 Essential (primary) hypertension: Secondary | ICD-10-CM

## 2023-03-24 DIAGNOSIS — I2510 Atherosclerotic heart disease of native coronary artery without angina pectoris: Secondary | ICD-10-CM

## 2023-03-24 MED ORDER — CLOPIDOGREL BISULFATE 75 MG PO TABS: 75.0000 mg | ORAL_TABLET | Freq: Every day | ORAL | 3 refills | Status: DC

## 2023-03-24 MED ORDER — NITROGLYCERIN 0.4 MG SL SUBL: SUBLINGUAL_TABLET | SUBLINGUAL | 3 refills | Status: DC

## 2023-03-24 MED ORDER — AMLODIPINE BESYLATE 10 MG PO TABS
10.0000 mg | ORAL_TABLET | Freq: Every day | ORAL | 3 refills | Status: DC
Start: 2023-03-24 — End: 2024-03-30

## 2023-03-24 MED ORDER — PANTOPRAZOLE SODIUM 40 MG PO TBEC: 40.0000 mg | DELAYED_RELEASE_TABLET | Freq: Every day | ORAL | 3 refills | Status: DC

## 2023-03-24 MED ORDER — ROSUVASTATIN CALCIUM 40 MG PO TABS: 40.0000 mg | ORAL_TABLET | Freq: Every day | ORAL | 3 refills | Status: DC

## 2023-03-24 NOTE — Progress Notes (Signed)
Cardiology Office Note:  .   Date:  03/24/2023  ID:  Tonya Bryant, DOB 06/23/1951, MRN [id number] PCP: Tonya Heys, MD  [facility] Providers Cardiologist:  Tonya Magic, MD {  History of Present Illness: .   Tonya Bryant is a 72 y.o. female with past medical history of CAD status post drug-eluting stents in the mid circumflex and distal LAD on 11/01/2015.  Cardiac cath shows residual 65% first diagonal stenosis as well.  Was on long-acting nitrates for chronic angina.  She remains on chronic DAPT as well.  Nuclear stress test showed no ischemia 09/2016 which was done for chest pain.  She has GERD and her Protonix was increased to twice daily but has increased fatigue and went back to daily.  Due to shortness of breath with exertion at [facility] was ordered 11/11/2021 showing no ischemia, LVEF 65%.  2D echo at that time showed EF 65 to 70% with mild LVH and G1 DD.  She was last seen in February 19, 2023 for follow-up.  Has chronic atypical chest pain that occurs when she is rushing and has not changed any since a year ago when her stress test was done.  Chronic SOB related to obesity and is unchanged from a year ago.  Denied any PND, orthopnea, lower extremity edema, dizziness, palpitation, syncope.  Compliant with medications.  Recently, was in the ED for hypertension.  Here for follow-up.  Today, she was dizzy and BP was really high. She has been keep track of it at home. She brought her cuff, 150/70.  This is slightly higher than what we got in the clinic.  She had stopped taking one of her blood pressure pills and missed a few doses and that is why her blood pressure was elevated. When she overworks herself some chest pains. Has not needed nitroglycerin.  If nitroglycerin use becomes more frequent, we could increase her Imdur if needed.  We discussed avenues for weight loss including dietary changes and exercise.  She states that she lives with Tonya Bryant and has a hard time  stopping when she starts eating these.  Would start with dietary changes and then possible referral to Dr. Cristal [facility] for [facility]  Reports no shortness of breath nor dyspnea on exertion. No edema, orthopnea, PND. Reports no palpitations.    ROS: Pertinent ROS in HPI  Studies Reviewed: .       Echo March 10/31/21    The study is normal. The study is low risk.   No ST deviation was noted.   LV perfusion is abnormal. Defect 1: There is a small defect with mild reduction in uptake present in the apical anteroseptal location(s) that is fixed. There is normal wall motion in the defect area. Consistent with artifact caused by breast attenuation.   Left ventricular function is normal. End diastolic cavity size is normal.   Prior study available for comparison from 12/13/2017. No changes compared to prior study.   Low risk stress nuclear study with normal perfusion and normal left ventricular regional and global systolic function.      Physical Exam:   VS:  BP 136/68   Pulse 66   Ht 5\' 1"  (1.549 m)   Wt 210 lb 9.6 oz (95.5 kg)   SpO2 96%   BMI 39.79 kg/m    Wt Readings from Last 3 Encounters:  03/24/23 210 lb 9.6 oz (95.5 kg)  02/25/23 209 lb (94.8 kg)  02/19/23 209 lb (94.8 kg)  GEN: Well nourished, well developed in no acute distress NECK: No JVD; No carotid bruits CARDIAC: RRR, no murmurs, rubs, gallops RESPIRATORY:  Clear to auscultation without rales, wheezing or rhonchi  ABDOMEN: Soft, non-tender, non-distended EXTREMITIES: 1+ pitting edema; No deformity   ASSESSMENT AND PLAN: .   1.  CAD -occasional chest tightness -Continue amlodipine 10 mg daily, aspirin 81 mg daily, Plavix 75 mg daily, Imdur 60 mg daily, lisinopril 40 mg daily, nitro as needed, and Crestor 40 mg daily -If chest tightness becomes more frequent or more severe she is to call our office  2.  Hypertension -well controlled today -LE edema, on amlodipine 10 mg daily -If lower extremity edema does not  resolve she is to call our office so we can switch her medications  3.  HLD with LDL goal less than 70 -get from PCP in Oct 2024 due      Dispo: She can follow-up with Tonya Bryant in 4 to 6 months or APP  Signed, Tonya Dory, PA-C

## 2023-03-24 NOTE — Patient Instructions (Addendum)
Medication Instructions:  Your physician recommends that you continue on your current medications as directed. Please refer to the Current Medication list given to you today. *If you need a refill on your cardiac medications before your next appointment, please call your pharmacy*   Lab Work: LFTs & LIPIDs with your PCP If you have labs (blood work) drawn today and your tests are completely normal, you will receive your results only by: MyChart Message (if you have MyChart) OR A paper copy in the mail If you have any lab test that is abnormal or we need to change your treatment, we will call you to review the results.   Testing/Procedures: None ordered   Follow-Up: At [facility], you and your health needs are our priority.  As part of our continuing mission to provide you with exceptional heart care, we have created designated Provider Care Teams.  These Care Teams include your primary Cardiologist (physician) and Advanced Practice Providers (APPs -  Physician Assistants and Nurse Practitioners) who all work together to provide you with the care you need, when you need it.  We recommend signing up for the patient portal called "MyChart".  Sign up information is provided on this After Visit Summary.  MyChart is used to connect with patients for Virtual Visits (Telemedicine).  Patients are able to view lab/test results, encounter notes, upcoming appointments, etc.  Non-urgent messages can be sent to your provider as well.   To learn more about what you can do with MyChart, go to [email or web address].    Your next appointment:   4-6 month(s)  Provider:   Armanda Magic, MD  or APP   Other Instructions  Check your blood pressure daily for 2 weeks, then contact the office with your readings.  Make sure to check 2 hours after your medications.   AVOID these things for 30 minutes before checking your blood pressure: No Drinking caffeine. No Drinking alcohol. No Eating. No  Smoking. No Exercising.  Five minutes before checking your blood pressure: Pee. Sit in a dining chair. Avoid sitting in a soft couch or armchair. Be quiet. Do not talk.

## 2023-05-18 ENCOUNTER — Other Ambulatory Visit: Payer: Self-pay

## 2023-05-18 MED ORDER — ISOSORBIDE MONONITRATE ER 60 MG PO TB24: 60.0000 mg | ORAL_TABLET | Freq: Every day | ORAL | 3 refills | Status: DC

## 2023-06-03 ENCOUNTER — Other Ambulatory Visit: Payer: Self-pay | Admitting: Family Medicine

## 2023-06-03 ENCOUNTER — Ambulatory Visit
Admission: RE | Admit: 2023-06-03 | Discharge: 2023-06-03 | Disposition: A | Discharge status: Home / Self Care | Payer: Medicare HMO | Source: Ambulatory Visit | Attending: Family Medicine | Admitting: Family Medicine

## 2023-06-03 DIAGNOSIS — Z0000 Encounter for general adult medical examination without abnormal findings: Secondary | ICD-10-CM

## 2023-06-03 DIAGNOSIS — Z1231 Encounter for screening mammogram for malignant neoplasm of breast: Secondary | ICD-10-CM | POA: Diagnosis not present

## 2023-06-03 DIAGNOSIS — Z13820 Encounter for screening for osteoporosis: Secondary | ICD-10-CM

## 2023-07-13 DIAGNOSIS — H40013 Open angle with borderline findings, low risk, bilateral: Secondary | ICD-10-CM | POA: Diagnosis not present

## 2023-07-17 ENCOUNTER — Ambulatory Visit: Payer: Medicare HMO | Attending: Cardiology | Admitting: Cardiology

## 2023-07-17 ENCOUNTER — Encounter: Payer: Self-pay | Admitting: Cardiology

## 2023-07-17 VITALS — BP 120/76 | HR 62 | Ht 61.0 in | Wt 212.0 lb

## 2023-07-17 DIAGNOSIS — E669 Obesity, unspecified: Secondary | ICD-10-CM | POA: Diagnosis not present

## 2023-07-17 DIAGNOSIS — I10 Essential (primary) hypertension: Secondary | ICD-10-CM | POA: Diagnosis not present

## 2023-07-17 DIAGNOSIS — E785 Hyperlipidemia, unspecified: Secondary | ICD-10-CM | POA: Diagnosis not present

## 2023-07-17 DIAGNOSIS — I2510 Atherosclerotic heart disease of native coronary artery without angina pectoris: Secondary | ICD-10-CM | POA: Diagnosis not present

## 2023-07-17 NOTE — Addendum Note (Signed)
Addended by: Luellen Pucker on: 07/17/2023 01:27 PM   Modules accepted: Orders

## 2023-07-17 NOTE — Progress Notes (Signed)
Cardiology Office Note:    Date:  07/17/2023   ID:  Tonya Bryant, DOB 03/12/1951, MRN [id number]  PCP:  Blair Heys, MD (Inactive)  Cardiologist:  Armanda Magic, MD    Referring MD: Blair Heys, MD   Chief Complaint  Patient presents with   Coronary Artery Disease   Hypertension   Hyperlipidemia    History of Present Illness:    Tonya Bryant is a 72 y.o. female with a hx of CAD s/p drug-eluting stents in the mid circumflex and distal LAD on 11/01/15. Cath showed residual 65% first diagonal stenosis as well.  She is on long acting nitrates for chronic angina.  She remains on chronic DAPT as well. Nuclear stress test showed no ischemia 09/2016 that was done for CP.  She has GERD and her Protonix was increased to BID but had increased fatigue and went back to daily.      Due to shortness of breath with exertion a Lexiscan Myoview was done 11/11/2021 showing no ischemia and EF 65%.  2D echo at that time showed EF 65 to 70% with mild LVH and G1DD.  She was seen in June 2024 for followup without any change in her chronic atypical chest pain and chronic shortness of breath related to obesity.  She had been in the ER prior to that with hypertension.  She was complaining of lower extremity edema on a higher dose of amlodipine and she was encouraged to call back if it persisted.  She is here today for followup and is doing well.  She still has chronic DOE that is stable and related to obesity and sedentary state. She denies any chest pain or pressure, PND, orthopnea,  dizziness, palpitations or syncope. She occasionally has some mild LE edema. SHe is compliant with her meds and is tolerating meds with no SE.     Past Surgical History:  Procedure Laterality Date   BREAST EXCISIONAL BIOPSY Right    BREAST SURGERY     lt br bx-neg   CARDIAC CATHETERIZATION N/A 11/01/2015   Procedure: Left Heart Cath and Coronary Angiography;  Surgeon: Tonny Bollman, MD;  Location: [facility] INVASIVE CV LAB;   Service: Cardiovascular;  Laterality: N/A;   CARPAL TUNNEL RELEASE Left 07/07/2013   Procedure: LEFT CARPAL TUNNEL RELEASE;  Surgeon: Wyn Forster., MD;  Location: [facility];  Service: Orthopedics;  Laterality: Left;   CARPAL TUNNEL RELEASE Right 11/11/2021   Procedure: RIGHT ENDOSCOPIC CARPAL TUNNEL RELEASE;  Surgeon: Mack Hook, MD;  Location: [facility];  Service: Orthopedics;  Laterality: Right;   COLONOSCOPY     TOE OSTEOTOMY     rt big toe   TOOTH EXTRACTION     TRIGGER FINGER RELEASE Left 07/07/2013   Procedure: LEFT THUMB RELEASE A-1 PULLEY;  Surgeon: Wyn Forster., MD;  Location: [facility];  Service: Orthopedics;  Laterality: Left;   TUBAL LIGATION      Current Medications: Current Meds  Medication Sig   acetaminophen (TYLENOL) 325 MG tablet Take 2 tablets (650 mg total) by mouth every 6 (six) hours.   ALPRAZolam (XANAX) 0.25 MG tablet Take 1/2-One whole tablet by mouth daily   amLODipine (NORVASC) 10 MG tablet Take 1 tablet (10 mg total) by mouth daily.   aspirin 81 MG tablet Take 1 tablet (81 mg total) by mouth daily.   citalopram (CELEXA) 40 MG tablet Take 20 mg by mouth daily.   clopidogrel (PLAVIX) 75 MG tablet  Take 1 tablet (75 mg total) by mouth daily.   isosorbide mononitrate (IMDUR) 60 MG 24 hr tablet Take 1 tablet (60 mg total) by mouth daily.   nitroGLYCERIN (NITROSTAT) 0.4 MG SL tablet PLACE 1 TABLET UNDER THE TONGUE EVERY 5 MINUTES AS NEEDED FOR CHEST PAIN(3 DOSES ONLY)   pantoprazole (PROTONIX) 40 MG tablet Take 1 tablet (40 mg total) by mouth daily. Heart burn   rosuvastatin (CRESTOR) 40 MG tablet Take 1 tablet (40 mg total) by mouth daily.     Allergies:   Patient has no known allergies.   Social History   Socioeconomic History   Marital status: Single    Spouse name: Not on file   Number of children: Not on file   Years of education: Not on file   Highest education level: Not on file   Occupational History   Not on file  Tobacco Use   Smoking status: Never   Smokeless tobacco: Never  Vaping Use   Vaping status: Never Used  Substance and Sexual Activity   Alcohol use: Yes    Alcohol/week: 1.0 standard drink of alcohol    Types: 1 Standard drinks or equivalent per week    Comment: socially   Drug use: No   Sexual activity: Not Currently    Birth control/protection: None  Other Topics Concern   Not on file  Social History Narrative   Not on file   Social Determinants of Health   Financial Resource Strain: Not on file  Food Insecurity: Not on file  Transportation Needs: Not on file  Physical Activity: Not on file  Stress: Not on file  Social Connections: Not on file     Family History: The patient's family history includes Cancer in her mother; Diabetes in her father and mother; Gout in her father; Hypertension in her father and mother; Stroke in her father and mother.  ROS:   Please see the history of present illness.    ROS  All other systems reviewed and negative.   EKGs/Labs/Other Studies Reviewed:    The following studies were reviewed today: Outside labs on [facility]       Recent Labs: 02/25/2023: BUN 14; Creatinine, Ser 1.03; Hemoglobin 12.2; Platelets 226; Potassium 4.2; Sodium 138   Recent Lipid Panel    Component Value Date/Time   CHOL 148 10/19/2018 0800   TRIG 45 10/19/2018 0800   HDL 77 10/19/2018 0800   CHOLHDL 1.9 10/19/2018 0800   CHOLHDL 2.0 12/12/2017 0455   VLDL 11 12/12/2017 0455   LDLCALC 62 10/19/2018 0800    Physical Exam:    VS:  BP 120/76   Pulse 62   Ht 5\' 1"  (1.549 m)   Wt 212 lb (96.2 kg)   BMI 40.06 kg/m     Wt Readings from Last 3 Encounters:  07/17/23 212 lb (96.2 kg)  03/24/23 210 lb 9.6 oz (95.5 kg)  02/25/23 209 lb (94.8 kg)    GEN: Well nourished, well developed in no acute distress HEENT: Normal NECK: No JVD; No carotid bruits LYMPHATICS: No lymphadenopathy CARDIAC:RRR, no murmurs, rubs,  gallops RESPIRATORY:  Clear to auscultation without rales, wheezing or rhonchi  ABDOMEN: Soft, non-tender, non-distended MUSCULOSKELETAL:  trace edema; No deformity  SKIN: Warm and dry NEUROLOGIC:  Alert and oriented x 3 PSYCHIATRIC:  Normal affect  ASSESSMENT:    1. Coronary artery disease involving native coronary artery of native heart without angina pectoris   2. Essential hypertension   3. Dyslipidemia, goal LDL  below 70     PLAN:    In order of problems listed above:  1.  ASCAD -s/p drug-eluting stents in the mid circumflex and distal LAD on 11/01/15 with residual 65% first diagonal stenosis as well.  -nuclear stress test in 2023 with no ischemia -She denies any anginal symptoms since I saw her last -Continue drug management with aspirin 81 mg daily, Plavix 75 mg daily, Imdur 60 mg daily, Crestor 40 mg daily with as needed refills  2.  HTN -BP controlled on exam today -Continue prescription drug management with amlodipine 10 mg daily, Imdur 60 mg daily, lisinopril 40 mg daily with as needed refills -I have personally reviewed and interpreted outside labs performed by patient's PCP which showed serum creatinine 1.03 and potassium 3.8 in July and October 2024   3.  HLD  -LDL goal was < 70 -I have personally reviewed and interpreted outside labs performed by patient's PCP which showed LDL 53 and HDL 68 on 06/03/2023 with normal ALT 23 -Continue prescription drug management and with Crestor 40 mg daily with as needed refills  4.  Morbid Obesity -she would like to consider weightl loss injections so I will refer her to PharmD to consider [facility]   Medication Adjustments/Labs and Tests Ordered: Current medicines are reviewed at length with the patient today.  Concerns regarding medicines are outlined above.  No orders of the defined types were placed in this encounter.  Followup with me in 1 year  No orders of the defined types were placed in this  encounter.   Signed, Armanda Magic, MD  07/17/2023 1:17 PM    [facility]

## 2023-07-17 NOTE — Patient Instructions (Signed)
Medication Instructions:  Your physician recommends that you continue on your current medications as directed. Please refer to the Current Medication list given to you today.  *If you need a refill on your cardiac medications before your next appointment, please call your pharmacy*   Lab Work: None.  If you have labs (blood work) drawn today and your tests are completely normal, you will receive your results only by: MyChart Message (if you have MyChart) OR A paper copy in the mail If you have any lab test that is abnormal or we need to change your treatment, we will call you to review the results.   Testing/Procedures: None.   Follow-Up:  Your next appointment:   1 year(s)  Provider:   Armanda Magic, MD     Other Instructions Dr. Mayford Knife has referred you to our pharmacy clinic for weight loss drugs.

## 2023-07-24 ENCOUNTER — Other Ambulatory Visit: Payer: Self-pay | Admitting: Physician Assistant

## 2023-08-10 ENCOUNTER — Other Ambulatory Visit: Payer: Self-pay | Admitting: Physician Assistant

## 2023-08-24 ENCOUNTER — Telehealth: Payer: Self-pay | Admitting: Pharmacy Technician

## 2023-08-24 ENCOUNTER — Encounter: Payer: Self-pay | Admitting: Student

## 2023-08-24 ENCOUNTER — Ambulatory Visit: Payer: Medicare HMO | Attending: Cardiovascular Disease | Admitting: Student

## 2023-08-24 ENCOUNTER — Other Ambulatory Visit (HOSPITAL_COMMUNITY): Payer: Self-pay

## 2023-08-24 ENCOUNTER — Telehealth: Payer: Self-pay | Admitting: Pharmacist

## 2023-08-24 VITALS — Ht 60.0 in | Wt 213.6 lb

## 2023-08-24 DIAGNOSIS — E669 Obesity, unspecified: Secondary | ICD-10-CM | POA: Diagnosis not present

## 2023-08-24 NOTE — Telephone Encounter (Signed)
Pharmacy Patient Advocate Encounter  Received notification from [facility] Monia Pouch that Prior Authorization for zepbound has been DENIED.  See denial reason below. No denial letter attached in CMM. Will attach denial letter to Media tab once received.   PA #/Case ID/Reference #: [id number]

## 2023-08-24 NOTE — Progress Notes (Signed)
Patient ID: Tonya Bryant                 DOB: 1950-08-30                    MRN: [id number]     HPI: Tonya Bryant is a 72 y.o. female patient referred to pharmacy clinic by Dr.Turner to initiate GLP1-RA therapy. PMH is significant for CADs/p drug-eluting stents in the mid circumflex and distal LAD on 11/01/15 ,HTN, HLD, obesity- BMI 40.  Baseline weight and BMI: 213.9lbs  41.72 Current weight and BMI: 213.9 lbs 41.72  Current meds that affect weight: none   Discussed significance of diet and exercise in detail. Provided meal plan to follow.   Diet: breakfast: sausage  Lunch: fried chicken, spaghetti, baloney sandwich  Dinner: sandwich Snack: popcorn, candies, oatmeal cakes, chips, cheese  Eats out once every 2 months    Drink: water,soda and sweet tea- once every 2 months   Exercise: walking at work   Family History:  Relation Problem Comments  Mother (Deceased) Cancer   Diabetes   Hypertension   Stroke     Father (Deceased) Diabetes   Gout   Hypertension   Stroke      Social History:  Alcohol: Beer 3-4 over weekend not every weekends  Smoking: never  Labs: Lab Results  Component Value Date   HGBA1C 5.5 12/12/2017    Wt Readings from Last 1 Encounters:  07/17/23 212 lb (96.2 kg)    BP Readings from Last 1 Encounters:  07/17/23 120/76   Pulse Readings from Last 1 Encounters:  07/17/23 62       Component Value Date/Time   CHOL 148 10/19/2018 0800   TRIG 45 10/19/2018 0800   HDL 77 10/19/2018 0800   CHOLHDL 1.9 10/19/2018 0800   CHOLHDL 2.0 12/12/2017 0455   VLDL 11 12/12/2017 0455   LDLCALC 62 10/19/2018 0800    Past Medical History:  Diagnosis Date   Anginal pain (HCC)    Anxiety    CAD (coronary artery disease) 11/02/2015   s/p DES to dLAD, s/p DES to mLCx in 10/2015 // Myoview 4/19: no ischemia // Echo 4/19: EF 60-65, mod LVH, Gr 1 DD // Echocardiogram 3/23: EF 65-70, no RWMA, mild LVH, Gr 1 DD, GLS -21.1, normal RVSF, normal PASP, RVSP 23.6,  mild LAE   Depression (emotion)    Dyslipidemia, goal LDL below 70 02/07/2016   Full dentures    GERD (gastroesophageal reflux disease)    Hyperlipidemia    Hypertension     Current Outpatient Medications on File Prior to Visit  Medication Sig Dispense Refill   acetaminophen (TYLENOL) 325 MG tablet Take 2 tablets (650 mg total) by mouth every 6 (six) hours.     ALPRAZolam (XANAX) 0.25 MG tablet Take 1/2-One whole tablet by mouth daily     amLODipine (NORVASC) 10 MG tablet Take 1 tablet (10 mg total) by mouth daily. 90 tablet 3   aspirin 81 MG tablet Take 1 tablet (81 mg total) by mouth daily. 90 tablet 0   citalopram (CELEXA) 40 MG tablet Take 20 mg by mouth daily.     clopidogrel (PLAVIX) 75 MG tablet Take 1 tablet (75 mg total) by mouth daily. 90 tablet 3   isosorbide mononitrate (IMDUR) 60 MG 24 hr tablet Take 1 tablet (60 mg total) by mouth daily. 90 tablet 3   lisinopril (ZESTRIL) 40 MG tablet Take 1 tablet (40 mg total) by  mouth daily. 90 tablet 3   nitroGLYCERIN (NITROSTAT) 0.4 MG SL tablet DISSOLVE ONE TABLET UNDER THE TONGUE EVERY 5 MINUTES AS NEEDED FOR CHEST PAIN.  DO NOT EXCEED A TOTAL OF 3 DOSES IN 15 MINUTES 25 tablet 0   pantoprazole (PROTONIX) 40 MG tablet Take 1 tablet (40 mg total) by mouth daily. Heart burn 90 tablet 3   rosuvastatin (CRESTOR) 40 MG tablet Take 1 tablet (40 mg total) by mouth daily. 90 tablet 3   No current facility-administered medications on file prior to visit.    No Known Allergies   Assessment/Plan:  1. Weight loss - Patient has not met goal of at least 5% of body weight loss with comprehensive lifestyle modifications alone in the past 3-6 months. Pharmacotherapy is appropriate to pursue as augmentation. Will assess coverage for Wegovy or Zepbound. Confirmed patient has no personal or family history of medullary thyroid carcinoma (MTC) or Multiple Endocrine Neoplasia syndrome type 2 (MEN 2). Injection technique reviewed at today's  visit.  Advised patient on common side effects including nausea, diarrhea, dyspepsia, decreased appetite, and fatigue. Counseled patient on reducing meal size and how to titrate medication to minimize side effects. Counseled patient to call if intolerable side effects or if experiencing dehydration, abdominal pain, or dizziness. Patient will adhere to dietary modifications and will target at least 150 minutes of moderate intensity exercise weekly.   Follow up in 1 month via telephone for tolerability update and dose titration.

## 2023-08-24 NOTE — Telephone Encounter (Signed)
Pharmacy Patient Advocate Encounter  Received notification from [facility] that Prior Authorization for wegovy has been APPROVED from 05/26/23 to 08/24/24. Ran test claim, Copay is $0.00 one month. This test claim was processed through [facility]- copay amounts may vary at other pharmacies due to pharmacy/plan contracts, or as the patient moves through the different stages of their insurance plan.   PA #/Case ID/Reference #: [name]

## 2023-08-24 NOTE — Telephone Encounter (Signed)
Pharmacy Patient Advocate Encounter   Received notification from Pt Calls Messages that prior authorization for zepbound is required/requested.   Insurance verification completed.   The patient is insured through [facility] .   Per test claim: PA required; PA submitted to above mentioned insurance via CoverMyMeds Key/confirmation #/EOC [id number] Status is pending

## 2023-08-24 NOTE — Telephone Encounter (Signed)
Pharmacy Patient Advocate Encounter   Received notification from Pt Calls Messages that prior authorization for wegovy is required/requested.   Insurance verification completed.   The patient is insured through [facility] .   Per test claim: PA required; PA submitted to above mentioned insurance via CoverMyMeds Key/confirmation #/EOC [id number] Status is pending

## 2023-08-24 NOTE — Patient Instructions (Addendum)
Meal planning is the key to setting you up for success. Here are some examples of healthy meal options.   Breakfast:     Option 1:  Omelette with vegetables (1 egg, spinach, mushrooms, or other vegetable of your choice), 2 slices whole-grain toast, tip of thumb size butter or soft margarine,  cup low-fat milk or yogurt    Option 2: steel-cut rolled oats (? cup dry), 1 tbsp peanut butter added to cooked oats,  cup low-fat milk.   Option 3: 2 slices whole-grain or rye toast with avocado spread ( small avocado mased with herbs and pepper to taste), 1 poached egg or sunn[place]ide up (cooked to your liking)   Option 4:  cup plain 0% [place] yogurt topped with  cup berries and  cup walnuts or almonds, 2 slices whole-grain or rye toast, tip of thumb size soft margarine/butter   Lunch:    Option 1: 2 cups red lentil soup, green salad with 1 tbsp homemade vinaigrette (extra virgin olive oil and vinegar of choice plus spices)   Option 2: 3 oz. roasted chicken, 2 slices whole-grain bread, 2 tsp mayonnaise, mustard, lettuce, tomato if desired, 1 fruit (example: medium-sized apple or small pear)   Option 3: 3 oz. tuna packed in water, 1 whole-wheat pita (6 inch), 2 tsp mayonnaise, lettuce, tomato, or other non-starchy vegetable of your choice, 1 fruit (example: medium-sized apple or small pear)   Option 4: 1 serving of garden veggie buddha bowl with lentils and tahini sauce and 1 cup berries topped with  cup plain 0% Greek yogurt   Dinner:    Option 1: 1 serving roasted cauliflower salad, 3-4 oz.  grilled or baked pork loin chop, 1/2 cup mashed potato, or brown rice or quinoa    Option 2: 1 serving fish (baked, grilled or air fried), green salad, 1 tbsp homemade vinaigrette,  cup cooked couscous   Option 3: 1 cup cooked whole grained pasta (example: spaghetti, spirals, macaroni),  cup favorite pasta sauce (preferably homemade), 3-4 oz.  grilled or baked chicken, green salad, 1 tbsp homemade  vinaigrette   Option 4: 1 serving oven roasted salmon,  cup mashed sweet potato or couscous or brown rice or quinoa, broccoli (steamed or roasted)   Healthy snacks:    Carrots or celery with 1 tbsp of hummus    1 medium-sized fruit (apple or orange)   1 cup plain 0% [place] yogurt with  cup berries   Half apple, sliced, with 1 tbsp (15 mL) peanut or almond butter   GLP1 Agonist Titration Plan: if Reginal Lutes is covered,will plan to follow the titration plan as below, pending patient is tolerating each dose before increasing to the next. Can slow titration if needed for tolerability.     Wegovy:  -Month 1: Inject Wegovy 0.25 mg once weekly x 4 weeks -Month 2: Inject Wegovy 0.5 mg once weekly x 4 weeks -Month 3: Inject Wegovy 1 mg once weekly x 4 weeks -Month 4: Inject WGNFAO 1.7mg  SQ once weekly x 4 weeks -Month 5+: Inject Wegovy 2.4mg  SQ once weekly

## 2023-08-24 NOTE — Telephone Encounter (Signed)
   This CMM does not give the options of diagnosis or chart notes

## 2023-08-25 MED ORDER — WEGOVY 0.25 MG/0.5ML ~~LOC~~ SOAJ: 0.2500 mg | SUBCUTANEOUS | 0 refills | Status: DC

## 2023-08-25 NOTE — Addendum Note (Signed)
Addended by: Tylene Fantasia on: 08/25/2023 11:34 AM   Modules accepted: Orders

## 2023-08-25 NOTE — Telephone Encounter (Signed)
Call to inform pt about PA approval and cost of Wegovy, N/A, LVM. MyChart sent

## 2023-09-10 NOTE — Telephone Encounter (Signed)
PA foe Reginal Lutes approved see other encounter

## 2023-09-17 ENCOUNTER — Other Ambulatory Visit: Payer: Self-pay | Admitting: Cardiology

## 2023-09-22 NOTE — Telephone Encounter (Signed)
Call to verify pt has initiated [facility] or not and ready for dose titration if already been taking 0.5 mg dose.   N/A LVM.

## 2023-09-24 ENCOUNTER — Telehealth: Payer: Self-pay | Admitting: Cardiology

## 2023-09-24 ENCOUNTER — Other Ambulatory Visit (HOSPITAL_COMMUNITY): Payer: Self-pay

## 2023-09-24 DIAGNOSIS — E669 Obesity, unspecified: Secondary | ICD-10-CM

## 2023-09-24 MED ORDER — WEGOVY 0.5 MG/0.5ML ~~LOC~~ SOAJ
0.5000 mg | SUBCUTANEOUS | 0 refills | Status: DC
  Filled 2023-09-24: qty 2, 28d supply, fill #0

## 2023-09-24 NOTE — Telephone Encounter (Signed)
Patient should be on Wegovy 0.5mg  once weekly. Have not sent. Recommend filling at a [facility] pharmacy since it is covered there for no charge

## 2023-09-24 NOTE — Addendum Note (Signed)
Addended by: Cheree Ditto on: 09/24/2023 01:48 PM   Modules accepted: Orders

## 2023-09-24 NOTE — Telephone Encounter (Signed)
Call to patient to advise this med was reordered to [facility] and should be at no cost to her. Patient verbalizes understanding.

## 2023-09-24 NOTE — Telephone Encounter (Signed)
Pt states that she received a text from [facility] pharmacy advising her to call office. Pt unsure of what it is in reference to bc she is utd on appt

## 2023-09-24 NOTE — Addendum Note (Signed)
Addended by: Luellen Pucker on: 09/24/2023 04:59 PM   Modules accepted: Orders

## 2023-09-24 NOTE — Telephone Encounter (Signed)
What strength is she trying to fill? I test claimed the loading dose and it's going through with a $0 copay. Says PA expires 08/25/23

## 2023-09-24 NOTE — Telephone Encounter (Signed)
Call to patient to find out more about what medication refill issues she is having. She states she tried to refill her wegovy and was told to contact her office. Forwarded to Prior Auth and patient assistance teams.

## 2023-09-28 ENCOUNTER — Other Ambulatory Visit (HOSPITAL_COMMUNITY): Payer: Self-pay

## 2023-10-08 ENCOUNTER — Telehealth: Payer: Self-pay | Admitting: Student

## 2023-10-08 NOTE — Telephone Encounter (Signed)
Spoke to pt, discussed some dietary changes that help to tolerate Wegovy 0.5 mg dose better. F/u in 2 weeks for dose titration.

## 2023-10-08 NOTE — Telephone Encounter (Signed)
Pt c/o medication issue:  1. Name of Medication:   Semaglutide-Weight Management (WEGOVY) 0.5 MG/0.5ML SOAJ    Inject 0.5 mg into the skin once a week.    2. How are you currently taking this medication (dosage and times per day)? As written   3. Are you having a reaction (difficulty breathing--STAT)? No   4. What is your medication issue? Pt called in stating when she started taking 0.5 mg she is having bad stomach cramps, nausea, and headache. Please advise.

## 2023-10-26 ENCOUNTER — Other Ambulatory Visit (HOSPITAL_COMMUNITY): Payer: Self-pay

## 2023-10-26 MED ORDER — WEGOVY 1 MG/0.5ML ~~LOC~~ SOAJ
1.0000 mg | SUBCUTANEOUS | 0 refills | Status: DC
  Filled 2023-10-26: qty 2, 28d supply, fill #0

## 2023-10-26 NOTE — Addendum Note (Signed)
 Addended by: Tylene Fantasia on: 10/26/2023 08:25 AM   Modules accepted: Orders

## 2023-10-26 NOTE — Telephone Encounter (Signed)
 Spoke to patient, tolerating Wegovy 0.5 mg dose well with suggested dietary changes. Took last dose on Sunday. Ready to increase dose to 1 mg once a week. Prescription sent. Will f/u in 3-4 weeks for further dose titration.

## 2023-11-02 DIAGNOSIS — J069 Acute upper respiratory infection, unspecified: Secondary | ICD-10-CM | POA: Diagnosis not present

## 2023-11-02 DIAGNOSIS — Z03818 Encounter for observation for suspected exposure to other biological agents ruled out: Secondary | ICD-10-CM | POA: Diagnosis not present

## 2023-11-11 ENCOUNTER — Other Ambulatory Visit (HOSPITAL_COMMUNITY): Payer: Self-pay

## 2023-11-11 ENCOUNTER — Telehealth: Payer: Self-pay | Admitting: Pharmacist

## 2023-11-11 MED ORDER — WEGOVY 1.7 MG/0.75ML ~~LOC~~ SOAJ
1.7000 mg | SUBCUTANEOUS | 0 refills | Status: DC
  Filled 2023-11-11: qty 3, 28d supply, fill #0

## 2023-11-11 NOTE — Telephone Encounter (Signed)
 Call to titrate [facility] dose.  Current weight is 190 lbs. Baseline weight was 213 lbs/ ~11% weight loss.  Tolerates current dose Wegovy well. Will increase to 1.7 mg once week and f/u via phone in 4 weeks for further dose titration.

## 2023-12-02 DIAGNOSIS — E785 Hyperlipidemia, unspecified: Secondary | ICD-10-CM | POA: Diagnosis not present

## 2023-12-02 DIAGNOSIS — F411 Generalized anxiety disorder: Secondary | ICD-10-CM | POA: Diagnosis not present

## 2023-12-02 DIAGNOSIS — K219 Gastro-esophageal reflux disease without esophagitis: Secondary | ICD-10-CM | POA: Diagnosis not present

## 2023-12-02 DIAGNOSIS — N179 Acute kidney failure, unspecified: Secondary | ICD-10-CM | POA: Diagnosis not present

## 2023-12-02 DIAGNOSIS — I10 Essential (primary) hypertension: Secondary | ICD-10-CM | POA: Diagnosis not present

## 2023-12-02 DIAGNOSIS — I25119 Atherosclerotic heart disease of native coronary artery with unspecified angina pectoris: Secondary | ICD-10-CM | POA: Diagnosis not present

## 2023-12-02 DIAGNOSIS — R7303 Prediabetes: Secondary | ICD-10-CM | POA: Diagnosis not present

## 2023-12-02 DIAGNOSIS — K5900 Constipation, unspecified: Secondary | ICD-10-CM | POA: Diagnosis not present

## 2023-12-02 DIAGNOSIS — F331 Major depressive disorder, recurrent, moderate: Secondary | ICD-10-CM | POA: Diagnosis not present

## 2023-12-03 ENCOUNTER — Other Ambulatory Visit (HOSPITAL_COMMUNITY): Payer: Self-pay

## 2023-12-03 MED ORDER — WEGOVY 1 MG/0.5ML ~~LOC~~ SOAJ
1.0000 mg | SUBCUTANEOUS | 11 refills | Status: DC
  Filled 2023-12-03: qty 2, 28d supply, fill #0

## 2023-12-15 NOTE — Telephone Encounter (Signed)
 Could not tolerate 1.4 mg dose so went back on 1 mg dose. Pharmacy could not fill as 1.4 mg was filled and it was too early to refill. Will be calling Pharmacy to refill 1 mg dose.

## 2023-12-28 MED ORDER — WEGOVY 1 MG/0.5ML ~~LOC~~ SOAJ: 1.0000 mg | SUBCUTANEOUS | 1 refills | Status: AC

## 2023-12-28 NOTE — Addendum Note (Signed)
 Addended by: Cortnie Ringel K on: 12/28/2023 11:07 AM   Modules accepted: Orders

## 2023-12-28 NOTE — Telephone Encounter (Signed)
 Spoke to patient, will be restarting 1 mg dose of Wegovy  from Dec 29, 2023, want to stay on 1 mg for 8 weeks to improve tolerability. Will f/u in 8 weeks for further dose titration

## 2023-12-29 ENCOUNTER — Ambulatory Visit
Admission: RE | Admit: 2023-12-29 | Discharge: 2023-12-29 | Disposition: A | Discharge status: Home / Self Care | Payer: Self-pay | Source: Ambulatory Visit | Attending: Family Medicine | Admitting: Family Medicine

## 2023-12-29 DIAGNOSIS — M8588 Other specified disorders of bone density and structure, other site: Secondary | ICD-10-CM | POA: Diagnosis not present

## 2023-12-29 DIAGNOSIS — N958 Other specified menopausal and perimenopausal disorders: Secondary | ICD-10-CM | POA: Diagnosis not present

## 2023-12-29 DIAGNOSIS — Z13820 Encounter for screening for osteoporosis: Secondary | ICD-10-CM

## 2024-01-04 DIAGNOSIS — I10 Essential (primary) hypertension: Secondary | ICD-10-CM | POA: Diagnosis not present

## 2024-01-19 ENCOUNTER — Telehealth: Payer: Self-pay | Admitting: Pharmacy Technician

## 2024-01-19 ENCOUNTER — Telehealth: Payer: Self-pay | Admitting: Cardiology

## 2024-01-19 ENCOUNTER — Other Ambulatory Visit (HOSPITAL_COMMUNITY): Payer: Self-pay

## 2024-01-19 NOTE — Telephone Encounter (Signed)
 I called [facility] and [facility] are filling [facility]. Called the patient and she is aware she can pick this up today  It runs just fine on her insurance for free

## 2024-01-19 NOTE — Telephone Encounter (Signed)
   I ran a test claim and came back:   I called [facility] but they are closed until lunch. I called the patient and she said [facility] had told her that it was 300.00 I will call [facility] when they open up at 2pm.   I called and they are filling this for [facility] at [facility]. Called the patient and she is aware she can pick this up today

## 2024-01-19 NOTE — Telephone Encounter (Signed)
 Pt c/o medication issue:  1. Name of Medication:   Semaglutide -Weight Management (WEGOVY ) 1 MG/0.5ML SOAJ Inject 1 mg into the skin once a week.     2. How are you currently taking this medication (dosage and times per day)? As written   3. Are you having a reaction (difficulty breathing--STAT)? No   4. What is your medication issue? Pt called in stating pharmacy told her this med would be around $300. Please advise on how price can be reduced.

## 2024-02-02 DIAGNOSIS — H40013 Open angle with borderline findings, low risk, bilateral: Secondary | ICD-10-CM | POA: Diagnosis not present

## 2024-02-18 ENCOUNTER — Telehealth: Payer: Self-pay | Admitting: Pharmacist

## 2024-02-18 NOTE — Telephone Encounter (Signed)
 Call to discuss Wegovy  tolerability and does adjustment, N/A. MyChart sent.

## 2024-02-28 ENCOUNTER — Other Ambulatory Visit: Payer: Self-pay | Admitting: Cardiology

## 2024-02-28 DIAGNOSIS — I10 Essential (primary) hypertension: Secondary | ICD-10-CM

## 2024-03-23 ENCOUNTER — Other Ambulatory Visit: Payer: Self-pay | Admitting: Physician Assistant

## 2024-03-24 DIAGNOSIS — F331 Major depressive disorder, recurrent, moderate: Secondary | ICD-10-CM | POA: Diagnosis not present

## 2024-03-24 DIAGNOSIS — I25118 Atherosclerotic heart disease of native coronary artery with other forms of angina pectoris: Secondary | ICD-10-CM | POA: Diagnosis not present

## 2024-03-24 DIAGNOSIS — I2510 Atherosclerotic heart disease of native coronary artery without angina pectoris: Secondary | ICD-10-CM | POA: Diagnosis not present

## 2024-03-24 DIAGNOSIS — F411 Generalized anxiety disorder: Secondary | ICD-10-CM | POA: Diagnosis not present

## 2024-03-30 ENCOUNTER — Other Ambulatory Visit: Payer: Self-pay

## 2024-03-30 MED ORDER — CLOPIDOGREL BISULFATE 75 MG PO TABS: 75.0000 mg | ORAL_TABLET | Freq: Every day | ORAL | 0 refills | Status: DC

## 2024-03-30 MED ORDER — AMLODIPINE BESYLATE 10 MG PO TABS: 10.0000 mg | ORAL_TABLET | Freq: Every day | ORAL | 0 refills | Status: DC

## 2024-04-05 ENCOUNTER — Emergency Department (HOSPITAL_COMMUNITY)

## 2024-04-05 ENCOUNTER — Other Ambulatory Visit: Payer: Self-pay

## 2024-04-05 ENCOUNTER — Encounter (HOSPITAL_COMMUNITY): Payer: Self-pay

## 2024-04-05 ENCOUNTER — Emergency Department (HOSPITAL_COMMUNITY)
Admission: EM | Admit: 2024-04-05 | Discharge: 2024-04-05 | Disposition: A | Discharge status: Home / Self Care | Attending: Emergency Medicine | Admitting: Emergency Medicine

## 2024-04-05 DIAGNOSIS — R9082 White matter disease, unspecified: Secondary | ICD-10-CM | POA: Insufficient documentation

## 2024-04-05 DIAGNOSIS — Z7982 Long term (current) use of aspirin: Secondary | ICD-10-CM | POA: Insufficient documentation

## 2024-04-05 DIAGNOSIS — K219 Gastro-esophageal reflux disease without esophagitis: Secondary | ICD-10-CM | POA: Diagnosis not present

## 2024-04-05 DIAGNOSIS — Z7902 Long term (current) use of antithrombotics/antiplatelets: Secondary | ICD-10-CM | POA: Insufficient documentation

## 2024-04-05 DIAGNOSIS — I2510 Atherosclerotic heart disease of native coronary artery without angina pectoris: Secondary | ICD-10-CM | POA: Insufficient documentation

## 2024-04-05 DIAGNOSIS — Z9181 History of falling: Secondary | ICD-10-CM | POA: Insufficient documentation

## 2024-04-05 DIAGNOSIS — I10 Essential (primary) hypertension: Secondary | ICD-10-CM | POA: Insufficient documentation

## 2024-04-05 DIAGNOSIS — Z6841 Body Mass Index (BMI) 40.0 and over, adult: Secondary | ICD-10-CM | POA: Diagnosis not present

## 2024-04-05 DIAGNOSIS — R519 Headache, unspecified: Secondary | ICD-10-CM | POA: Insufficient documentation

## 2024-04-05 DIAGNOSIS — R29818 Other symptoms and signs involving the nervous system: Secondary | ICD-10-CM | POA: Diagnosis not present

## 2024-04-05 DIAGNOSIS — R110 Nausea: Secondary | ICD-10-CM | POA: Diagnosis not present

## 2024-04-05 DIAGNOSIS — R531 Weakness: Secondary | ICD-10-CM | POA: Diagnosis not present

## 2024-04-05 DIAGNOSIS — E669 Obesity, unspecified: Secondary | ICD-10-CM | POA: Diagnosis not present

## 2024-04-05 DIAGNOSIS — Z79899 Other long term (current) drug therapy: Secondary | ICD-10-CM | POA: Diagnosis not present

## 2024-04-05 DIAGNOSIS — E785 Hyperlipidemia, unspecified: Secondary | ICD-10-CM | POA: Diagnosis not present

## 2024-04-05 DIAGNOSIS — M545 Low back pain, unspecified: Secondary | ICD-10-CM | POA: Diagnosis not present

## 2024-04-05 LAB — RESP PANEL BY RT-PCR (RSV, FLU A&B, COVID)  RVPGX2
Influenza A by PCR: NEGATIVE
Influenza B by PCR: NEGATIVE
Resp Syncytial Virus by PCR: NEGATIVE
SARS Coronavirus 2 by RT PCR: NEGATIVE

## 2024-04-05 LAB — URINALYSIS, ROUTINE W REFLEX MICROSCOPIC
Bilirubin Urine: NEGATIVE
Glucose, UA: NEGATIVE mg/dL
Hgb urine dipstick: NEGATIVE
Ketones, ur: NEGATIVE mg/dL
Leukocytes,Ua: NEGATIVE
Nitrite: NEGATIVE
Protein, ur: 100 mg/dL — AB
Specific Gravity, Urine: 1.013 (ref 1.005–1.030)
pH: 7 (ref 5.0–8.0)

## 2024-04-05 LAB — CBC
HCT: 35.8 % — ABNORMAL LOW (ref 36.0–46.0)
Hemoglobin: 11.9 g/dL — ABNORMAL LOW (ref 12.0–15.0)
MCH: 32.2 pg (ref 26.0–34.0)
MCHC: 33.2 g/dL (ref 30.0–36.0)
MCV: 97 fL (ref 80.0–100.0)
Platelets: 254 K/uL (ref 150–400)
RBC: 3.69 MIL/uL — ABNORMAL LOW (ref 3.87–5.11)
RDW: 12.2 % (ref 11.5–15.5)
WBC: 7.6 K/uL (ref 4.0–10.5)
nRBC: 0 % (ref 0.0–0.2)

## 2024-04-05 LAB — COMPREHENSIVE METABOLIC PANEL WITH GFR
ALT: 23 U/L (ref 0–44)
AST: 21 U/L (ref 15–41)
Albumin: 3.7 g/dL (ref 3.5–5.0)
Alkaline Phosphatase: 46 U/L (ref 38–126)
Anion gap: 8 (ref 5–15)
BUN: 10 mg/dL (ref 8–23)
CO2: 24 mmol/L (ref 22–32)
Calcium: 9.2 mg/dL (ref 8.9–10.3)
Chloride: 104 mmol/L (ref 98–111)
Creatinine, Ser: 1.17 mg/dL — ABNORMAL HIGH (ref 0.44–1.00)
GFR, Estimated: 50 mL/min — ABNORMAL LOW (ref >60)
Glucose, Bld: 94 mg/dL (ref 70–99)
Potassium: 4.5 mmol/L (ref 3.5–5.1)
Sodium: 136 mmol/L (ref 135–145)
Total Bilirubin: 0.8 mg/dL (ref 0.0–1.2)
Total Protein: 6.7 g/dL (ref 6.5–8.1)

## 2024-04-05 NOTE — ED Provider Notes (Signed)
 [place] EMERGENCY DEPARTMENT AT [facility] Provider Note   CSN: [id number] Arrival date & time: 04/05/24  0845     Patient presents with: Headache, Back Pain, and Nausea   Tonya Bryant is a 73 y.o. female.   Tonya Bryant is a 73 y.o. female w/ PMH of HLD, CAD, HTN, GERD, and obesity presents w/ multiple pain-related chief complaints. The patient endorses the following: Frontal headache for a few days that feels like a stabbing pain; worsened this AM.  Episode of sharp left-sided chest pains last night and today.  Nausea for undetermined time period (I don't know, a while) w/ an episode of vomiting and diarrhea 1 week ago; patient also reporting decreased appetite.  Dizziness since standing up this morning. Denies LOC, vision changes.  Significant tiredness / fatigue for indeterminate amount of time. Denies fevers, chills, weight changes. Denies sick contacts.  Pain to the back of the neck, lower back, and abdomen for a while. Endorses tripping and falling 1 week ago; no LO, did not hit head - landed on hands and knees.  Difficulty urinating, dysuria, bubbles in urine. Denies hematuria and frequency     Headache Associated symptoms: back pain   Back Pain Associated symptoms: headaches        Prior to Admission medications   Medication Sig Start Date End Date Taking? Authorizing Provider  acetaminophen  (TYLENOL ) 325 MG tablet Take 2 tablets (650 mg total) by mouth every 6 (six) hours. 11/11/21   Sebastian Lenis, MD  ALPRAZolam (XANAX) 0.25 MG tablet Take 1/2-One whole tablet by mouth daily 06/11/21   [provider]  amLODipine  (NORVASC ) 10 MG tablet Take 1 tablet (10 mg total) by mouth daily. 03/30/24   Shlomo Wilbert SAUNDERS, MD  aspirin  81 MG tablet Take 1 tablet (81 mg total) by mouth daily. 11/02/15   Ghimire, Donalda HERO, MD  citalopram  (CELEXA ) 40 MG tablet Take 20 mg by mouth daily.    [provider]  clopidogrel  (PLAVIX ) 75 MG tablet Take  1 tablet (75 mg total) by mouth daily. 03/30/24   Shlomo Wilbert SAUNDERS, MD  isosorbide  mononitrate (IMDUR ) 60 MG 24 hr tablet Take 1 tablet (60 mg total) by mouth daily. 05/18/23   Shlomo Wilbert SAUNDERS, MD  lisinopril  (ZESTRIL ) 40 MG tablet Take 1 tablet by mouth once daily 02/29/24   Turner, Wilbert SAUNDERS, MD  nitroGLYCERIN  (NITROSTAT ) 0.4 MG SL tablet DISSOLVE ONE TABLET UNDER THE TONGUE EVERY 5 MINUTES AS NEEDED FOR CHEST PAIN.  DO NOT EXCEED A TOTAL OF 3 DOSES IN 15 MINUTES 07/28/23   Lucien Orren SAILOR, PA-C  pantoprazole  (PROTONIX ) 40 MG tablet Take 1 tablet (40 mg total) by mouth daily. Heart burn 03/24/23   Lucien Orren SAILOR, PA-C  rosuvastatin  (CRESTOR ) 40 MG tablet Take 1 tablet (40 mg total) by mouth daily. 03/24/23   Lucien Orren SAILOR, PA-C  Semaglutide -Weight Management (WEGOVY ) 1 MG/0.5ML SOAJ Inject 1 mg into the skin once a week. 12/28/23   Shlomo Wilbert SAUNDERS, MD    Allergies: Patient has no known allergies.    Review of Systems  Musculoskeletal:  Positive for back pain.  Neurological:  Positive for headaches.  All other systems reviewed and are negative.   Updated Vital Signs BP 134/67   Pulse 66   Temp 97.8 F (36.6 C)   Resp 15   Ht 5' (1.524 m)   SpO2 100%   BMI 41.72 kg/m   Physical Exam Vitals and nursing note reviewed.  Constitutional:      Appearance: She is well-developed.  HENT:     Head: Normocephalic.  Cardiovascular:     Rate and Rhythm: Normal rate.  Pulmonary:     Effort: Pulmonary effort is normal.  Abdominal:     General: There is no distension.  Musculoskeletal:        General: Normal range of motion.     Cervical back: Normal range of motion.  Skin:    General: Skin is warm.  Neurological:     General: No focal deficit present.     Mental Status: She is alert and oriented to person, place, and time.     (all labs ordered are listed, but only abnormal results are displayed) Labs Reviewed  COMPREHENSIVE METABOLIC PANEL WITH GFR - Abnormal; Notable for the following  components:      Result Value   Creatinine, Ser 1.17 (*)    GFR, Estimated 50 (*)    All other components within normal limits  CBC - Abnormal; Notable for the following components:   RBC 3.69 (*)    Hemoglobin 11.9 (*)    HCT 35.8 (*)    All other components within normal limits  URINALYSIS, ROUTINE W REFLEX MICROSCOPIC - Abnormal; Notable for the following components:   Protein, ur 100 (*)    Bacteria, UA RARE (*)    All other components within normal limits  RESP PANEL BY RT-PCR (RSV, FLU A&B, COVID)  [id number]    EKG: EKG Interpretation Date/Time:  Tuesday April 05 2024 12:20:58 EDT Ventricular Rate:  58 PR Interval:  195 QRS Duration:  88 QT Interval:  403 QTC Calculation: 396 R Axis:   -10  Text Interpretation: Sinus rhythm Low voltage, precordial leads Confirmed by Bernard Drivers ([id number]) on 04/05/2024 12:30:09 PM  Radiology: CT Head Wo Contrast Result Date: 04/05/2024 EXAM: CT HEAD WITHOUT CONTRAST 04/05/2024 12:34:47 PM TECHNIQUE: CT of the head was performed without the administration of intravenous contrast. Automated exposure control, iterative reconstruction, and/or weight based adjustment of the mA/kV was utilized to reduce the radiation dose to as low as reasonably achievable. COMPARISON: 08/31/1999 CLINICAL HISTORY: Headache, neuro deficit. FINDINGS: BRAIN AND VENTRICLES: No acute hemorrhage. Gray-white differentiation is preserved. No hydrocephalus. No extra-axial collection. No mass effect or midline shift. Mild periventricular white matter disease. There appears to be a chronic lacunar infarct within the right thalamus. ORBITS: The patient is status post bilateral lens surgery. SINUSES: No acute abnormality. SOFT TISSUES AND SKULL: No acute soft tissue abnormality. No skull fracture. VASCULATURE: Moderate calcific atheromatous disease within the carotid siphons. IMPRESSION: 1. No acute intracranial abnormality. 2. Mild periventricular white matter disease. 3. 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 Electronically signed by: evalene coho 04/05/2024 01:05 PM EDT RP Workstation: [id number]     Procedures   Medications Ordered in the ED - No data to display                                  Medical Decision Making Patient complains of weakness.  Patient reports that she has felt achy.Patient complains of experiencing a headache.  Patient reports experiencing some dizziness on and off.  Amount and/or Complexity of Data Reviewed Independent Historian: caregiver    Details: Patient is here with family who is supportive Labs: ordered. Decision-making details documented in ED Course.    Details: Labs ordered reviewed and interpreted flu COVID and RSV are negative.  UA is negative Radiology: ordered and independent interpretation performed. Decision-making details documented in ED Course.    Details: Chest x-ray no acute cardiopulmonary disease CT head no acute findings ECG/medicine tests: ordered and independent interpretation performed. Decision-making details documented in ED Course.    Details: EKG sinus rhythm no acute findings  Risk OTC drugs. Risk Details: Patient counseled on results.  Patient is eating and drinking without difficulty.  Patient is advised to follow-up with her primary care physician for recheck.        Final diagnoses:  Nausea  Weakness    ED Discharge Orders     None       An After Visit Summary was printed and given to the patient.    Flint Sonny POUR, PA-C 04/06/24 1508    Bernard Drivers, MD 04/07/24 1324

## 2024-04-05 NOTE — ED Triage Notes (Addendum)
 Pt came in via POV d/t the last week starting to feel very weak & cold with a HA, mouth dry, neck pain, lower back pain & nauseous. A/Ox4, rates her pain 6/10 during triage.

## 2024-04-05 NOTE — Medical Student Note (Incomplete)
 [facility] DEPT Provider Student Note For educational purposes for Medical, PA and NP students only and not part of the legal medical record.   CSN: [id number] Arrival date & time: 04/05/24  0845      History   Chief Complaint Chief Complaint  Patient presents with   Headache   Back Pain   Nausea    HPI Tonya Bryant is a 73 y.o. female w/ PMH of HLD, CAD, HTN, GERD, and obesity presents w/ multiple pain-related chief complaints. The patient endorses the following: Frontal headache for a few days that feels like a stabbing pain; worsened this AM.  Episode of sharp left-sided chest pains last night and today.  Nausea for undetermined time period (I don't know, a while) w/ an episode of vomiting and diarrhea 1 week ago; patient also reporting decreased appetite.  Dizziness since standing up this morning. Denies LOC, vision changes.  Significant tiredness / fatigue for indeterminate amount of time. Denies fevers, chills, weight changes. Denies sick contacts.  Pain to the back of the neck, lower back, and abdomen for a while. Endorses tripping and falling 1 week ago; no LO, did not hit head - landed on hands and knees.  Difficulty urinating, dysuria, bubbles in urine. Denies hematuria and frequency.     Headache Associated symptoms: back pain   Back Pain Associated symptoms: headaches     Past Medical History:  Diagnosis Date   Anginal pain (HCC)    Anxiety    CAD (coronary artery disease) 11/02/2015   s/p DES to dLAD, s/p DES to mLCx in 10/2015 // Myoview  4/19: no ischemia // Echo 4/19: EF 60-65, mod LVH, Gr 1 DD // Echocardiogram 3/23: EF 65-70, no RWMA, mild LVH, Gr 1 DD, GLS -21.1, normal RVSF, normal PASP, RVSP 23.6, mild LAE   Depression (emotion)    Dyslipidemia, goal LDL below 70 02/07/2016   Full dentures    GERD (gastroesophageal reflux disease)    Hyperlipidemia    Hypertension     Patient Active Problem List   Diagnosis Date Noted   Shortness  of breath 10/23/2021   Preoperative cardiovascular examination 10/22/2021   GERD (gastroesophageal reflux disease) 12/11/2017   Depression 12/11/2017   Edema of extremities 07/13/2017   Dyslipidemia, goal LDL below 70 02/07/2016   Coronary artery disease involving native coronary artery of native heart with angina pectoris (HCC) 11/02/2015   Essential hypertension 10/31/2015   Bradycardia 10/31/2015   Obesity 10/31/2015   Ingrown right greater toenail 04/27/2013    Past Surgical History:  Procedure Laterality Date   BREAST EXCISIONAL BIOPSY Right    BREAST SURGERY     lt br bx-neg   CARDIAC CATHETERIZATION N/A 11/01/2015   Procedure: Left Heart Cath and Coronary Angiography;  Surgeon: Ozell Fell, MD;  Location: [facility] INVASIVE CV LAB;  Service: Cardiovascular;  Laterality: N/A;   CARPAL TUNNEL RELEASE Left 07/07/2013   Procedure: LEFT CARPAL TUNNEL RELEASE;  Surgeon: Lamar LULLA Leonor Mickey., MD;  Location: [facility];  Service: Orthopedics;  Laterality: Left;   CARPAL TUNNEL RELEASE Right 11/11/2021   Procedure: RIGHT ENDOSCOPIC CARPAL TUNNEL RELEASE;  Surgeon: Sebastian Lenis, MD;  Location: [facility];  Service: Orthopedics;  Laterality: Right;   COLONOSCOPY     TOE OSTEOTOMY     rt big toe   TOOTH EXTRACTION     TRIGGER FINGER RELEASE Left 07/07/2013   Procedure: LEFT THUMB RELEASE A-1 PULLEY;  Surgeon: Lamar LULLA Leonor Mickey., MD;  Location: [facility];  Service: Orthopedics;  Laterality: Left;   TUBAL LIGATION      OB History   No obstetric history on file.      Home Medications    Prior to Admission medications   Medication Sig Start Date End Date Taking? Authorizing Provider  acetaminophen  (TYLENOL ) 325 MG tablet Take 2 tablets (650 mg total) by mouth every 6 (six) hours. 11/11/21   Sebastian Lenis, MD  ALPRAZolam (XANAX) 0.25 MG tablet Take 1/2-One whole tablet by mouth daily 06/11/21   [provider]  amLODipine   (NORVASC ) 10 MG tablet Take 1 tablet (10 mg total) by mouth daily. 03/30/24   Shlomo Wilbert SAUNDERS, MD  aspirin  81 MG tablet Take 1 tablet (81 mg total) by mouth daily. 11/02/15   Ghimire, Donalda HERO, MD  citalopram  (CELEXA ) 40 MG tablet Take 20 mg by mouth daily.    [provider]  clopidogrel  (PLAVIX ) 75 MG tablet Take 1 tablet (75 mg total) by mouth daily. 03/30/24   Shlomo Wilbert SAUNDERS, MD  isosorbide  mononitrate (IMDUR ) 60 MG 24 hr tablet Take 1 tablet (60 mg total) by mouth daily. 05/18/23   Shlomo Wilbert SAUNDERS, MD  lisinopril  (ZESTRIL ) 40 MG tablet Take 1 tablet by mouth once daily 02/29/24   Turner, Wilbert SAUNDERS, MD  nitroGLYCERIN  (NITROSTAT ) 0.4 MG SL tablet DISSOLVE ONE TABLET UNDER THE TONGUE EVERY 5 MINUTES AS NEEDED FOR CHEST PAIN.  DO NOT EXCEED A TOTAL OF 3 DOSES IN 15 MINUTES 07/28/23   Lucien Orren SAILOR, PA-C  pantoprazole  (PROTONIX ) 40 MG tablet Take 1 tablet (40 mg total) by mouth daily. Heart burn 03/24/23   Lucien Orren SAILOR, PA-C  rosuvastatin  (CRESTOR ) 40 MG tablet Take 1 tablet (40 mg total) by mouth daily. 03/24/23   Lucien Orren SAILOR, PA-C  Semaglutide -Weight Management (WEGOVY ) 1 MG/0.5ML SOAJ Inject 1 mg into the skin once a week. 12/28/23   Shlomo Wilbert SAUNDERS, MD    Family History Family History  Problem Relation Age of Onset   Cancer Mother    Diabetes Mother    Stroke Mother    Hypertension Mother    Stroke Father    Diabetes Father    Hypertension Father    Gout Father     Social History Social History   Tobacco Use   Smoking status: Never   Smokeless tobacco: Never  Vaping Use   Vaping status: Never Used  Substance Use Topics   Alcohol use: Yes    Alcohol/week: 1.0 standard drink of alcohol    Types: 1 Standard drinks or equivalent per week    Comment: socially   Drug use: No     Allergies   Patient has no known allergies.   Review of Systems Review of Systems  Musculoskeletal:  Positive for back pain.  Neurological:  Positive for headaches.     Physical  Exam Updated Vital Signs BP 127/68 (BP Location: Right Arm)   Pulse 67   Temp 98 F (36.7 C)   Resp 20   Ht 5' (1.524 m)   SpO2 100%   BMI 41.72 kg/m   Physical Exam   ED Treatments / Results  Labs (all labs ordered are listed, but only abnormal results are displayed) Labs Reviewed  COMPREHENSIVE METABOLIC PANEL WITH GFR - Abnormal; Notable for the following components:      Result Value   Creatinine, Ser 1.17 (*)    GFR, Estimated 50 (*)    All other components  within normal limits  CBC - Abnormal; Notable for the following components:   RBC 3.69 (*)    Hemoglobin 11.9 (*)    HCT 35.8 (*)    All other components within normal limits  URINALYSIS, ROUTINE W REFLEX MICROSCOPIC - Abnormal; Notable for the following components:   Protein, ur 100 (*)    Bacteria, UA RARE (*)    All other components within normal limits  RESP PANEL BY RT-PCR (RSV, FLU A&B, COVID)  RVPGX2    EKG  Radiology No results found.  Procedures Procedures (including critical care time)  Medications Ordered in ED Medications - No data to display   Initial Impression / Assessment and Plan / ED Course  I have reviewed the triage vital signs and the nursing notes.  Pertinent labs & imaging results that were available during my care of the patient were reviewed by me and considered in my medical decision making (see chart for details).     ***  Final Clinical Impressions(s) / ED Diagnoses   Final diagnoses:  None    New Prescriptions New Prescriptions   No medications on file

## 2024-04-05 NOTE — ED Notes (Signed)
 Pt and daughter verbalized understating if discharge instructions. Opportunity given to ask questions if needed. Pt offered a wheelchair but declined.

## 2024-04-05 NOTE — ED Notes (Signed)
 Pt given graham crackers x2 and ice water w/ EDP permission.

## 2024-04-05 NOTE — ED Notes (Signed)
 Pt gone to CT daughter at bedside

## 2024-04-05 NOTE — Discharge Instructions (Addendum)
 Return if any problems.

## 2024-04-07 DIAGNOSIS — R300 Dysuria: Secondary | ICD-10-CM | POA: Diagnosis not present

## 2024-04-07 DIAGNOSIS — N952 Postmenopausal atrophic vaginitis: Secondary | ICD-10-CM | POA: Diagnosis not present

## 2024-04-07 DIAGNOSIS — R7303 Prediabetes: Secondary | ICD-10-CM | POA: Diagnosis not present

## 2024-04-07 DIAGNOSIS — I10 Essential (primary) hypertension: Secondary | ICD-10-CM | POA: Diagnosis not present

## 2024-04-24 DIAGNOSIS — I25118 Atherosclerotic heart disease of native coronary artery with other forms of angina pectoris: Secondary | ICD-10-CM | POA: Diagnosis not present

## 2024-04-24 DIAGNOSIS — F411 Generalized anxiety disorder: Secondary | ICD-10-CM | POA: Diagnosis not present

## 2024-04-24 DIAGNOSIS — F331 Major depressive disorder, recurrent, moderate: Secondary | ICD-10-CM | POA: Diagnosis not present

## 2024-04-24 DIAGNOSIS — I2510 Atherosclerotic heart disease of native coronary artery without angina pectoris: Secondary | ICD-10-CM | POA: Diagnosis not present

## 2024-04-27 ENCOUNTER — Other Ambulatory Visit: Payer: Self-pay

## 2024-05-15 ENCOUNTER — Other Ambulatory Visit: Payer: Self-pay | Admitting: Physician Assistant

## 2024-05-23 ENCOUNTER — Other Ambulatory Visit: Payer: Self-pay

## 2024-05-23 MED ORDER — ROSUVASTATIN CALCIUM 40 MG PO TABS: 40.0000 mg | ORAL_TABLET | Freq: Every day | ORAL | 0 refills | Status: DC

## 2024-05-24 DIAGNOSIS — F411 Generalized anxiety disorder: Secondary | ICD-10-CM | POA: Diagnosis not present

## 2024-05-24 DIAGNOSIS — F331 Major depressive disorder, recurrent, moderate: Secondary | ICD-10-CM | POA: Diagnosis not present

## 2024-05-24 DIAGNOSIS — I25118 Atherosclerotic heart disease of native coronary artery with other forms of angina pectoris: Secondary | ICD-10-CM | POA: Diagnosis not present

## 2024-05-24 DIAGNOSIS — I2510 Atherosclerotic heart disease of native coronary artery without angina pectoris: Secondary | ICD-10-CM | POA: Diagnosis not present

## 2024-05-28 ENCOUNTER — Other Ambulatory Visit: Payer: Self-pay | Admitting: Cardiology

## 2024-05-28 ENCOUNTER — Other Ambulatory Visit: Payer: Self-pay | Admitting: Physician Assistant

## 2024-06-24 DIAGNOSIS — I25118 Atherosclerotic heart disease of native coronary artery with other forms of angina pectoris: Secondary | ICD-10-CM | POA: Diagnosis not present

## 2024-06-24 DIAGNOSIS — F331 Major depressive disorder, recurrent, moderate: Secondary | ICD-10-CM | POA: Diagnosis not present

## 2024-06-24 DIAGNOSIS — I2510 Atherosclerotic heart disease of native coronary artery without angina pectoris: Secondary | ICD-10-CM | POA: Diagnosis not present

## 2024-06-24 DIAGNOSIS — F411 Generalized anxiety disorder: Secondary | ICD-10-CM | POA: Diagnosis not present

## 2024-06-28 ENCOUNTER — Other Ambulatory Visit: Payer: Self-pay | Admitting: Cardiology

## 2024-07-18 DIAGNOSIS — M79601 Pain in right arm: Secondary | ICD-10-CM | POA: Diagnosis not present

## 2024-07-18 DIAGNOSIS — R5383 Other fatigue: Secondary | ICD-10-CM | POA: Diagnosis not present

## 2024-07-18 DIAGNOSIS — M25511 Pain in right shoulder: Secondary | ICD-10-CM | POA: Diagnosis not present

## 2024-07-18 DIAGNOSIS — M25512 Pain in left shoulder: Secondary | ICD-10-CM | POA: Diagnosis not present

## 2024-07-18 DIAGNOSIS — R079 Chest pain, unspecified: Secondary | ICD-10-CM | POA: Diagnosis not present

## 2024-07-24 DIAGNOSIS — I2510 Atherosclerotic heart disease of native coronary artery without angina pectoris: Secondary | ICD-10-CM | POA: Diagnosis not present

## 2024-07-24 DIAGNOSIS — I25118 Atherosclerotic heart disease of native coronary artery with other forms of angina pectoris: Secondary | ICD-10-CM | POA: Diagnosis not present

## 2024-07-24 DIAGNOSIS — F331 Major depressive disorder, recurrent, moderate: Secondary | ICD-10-CM | POA: Diagnosis not present

## 2024-07-24 DIAGNOSIS — F411 Generalized anxiety disorder: Secondary | ICD-10-CM | POA: Diagnosis not present

## 2024-07-28 ENCOUNTER — Other Ambulatory Visit: Payer: Self-pay | Admitting: Physician Assistant

## 2024-08-09 ENCOUNTER — Other Ambulatory Visit: Payer: Self-pay | Admitting: Cardiology

## 2024-08-21 ENCOUNTER — Other Ambulatory Visit: Payer: Self-pay | Admitting: Cardiology

## 2024-09-06 ENCOUNTER — Other Ambulatory Visit: Payer: Self-pay | Admitting: Cardiology

## 2024-09-20 NOTE — Progress Notes (Unsigned)
 " Cardiology Office Note:  .   Date:  09/21/2024  ID:  Tonya Bryant, DOB 14-Jun-1951, MRN [id number] PCP: Hugh Charleston, MD (Inactive)  [facility] Providers Cardiologist:  Wilbert Bihari, MD {  History of Present Illness: .   Tonya Bryant is a 74 y.o. female with past medical history of CAD status post drug-eluting stents in the mid circumflex and distal LAD on 11/01/2015.  Cardiac cath shows residual 65% first diagonal stenosis as well.  Was on long-acting nitrates for chronic angina.  She remains on chronic DAPT as well.  Nuclear stress test showed no ischemia 09/2016 which was done for chest pain.  She has GERD and her Protonix  was increased to twice daily but has increased fatigue and went back to daily.  Due to shortness of breath with exertion at [employer]  Myoview  was ordered 11/11/2021 showing no ischemia, LVEF 65%.  2D echo at that time showed EF 65 to 70% with mild LVH and G1 DD.  She was last seen in February 19, 2023 for follow-up.  Has chronic atypical chest pain that occurs when she is rushing and has not changed any since a year ago when her stress test was done.  Chronic SOB related to obesity and is unchanged from a year ago.  Denied any PND, orthopnea, lower extremity edema, dizziness, palpitation, syncope.  Compliant with medications.  Recently, was in the ED for hypertension.  Here for follow-up.  I saw her 02/2023, she was dizzy and BP was really high. She has been keep track of it at home. She brought her cuff, 150/70.  This is slightly higher than what we got in the clinic.  She had stopped taking one of her blood pressure pills and missed a few doses and that is why her blood pressure was elevated. When she overworks herself some chest pains. Has not needed nitroglycerin .  If nitroglycerin  use becomes more frequent, we could increase her Imdur  if needed.  We discussed avenues for weight loss including dietary changes and exercise.  She states that she lives with Tonya Bryant  and has a hard time stopping when she starts eating these.  Would start with dietary changes and then possible referral to Dr. Lonni for Ozempic   Reports no shortness of breath nor dyspnea on exertion. No edema, orthopnea, PND. Reports no palpitations.   Today, she presents with a hx of hypertension and coronary artery disease for an annual cardiovascular follow-up.  She reports a couple of falls over the past year, which she attributes to walking too fast and not lifting her feet (mechanical fall), and denies recent dizziness.  She has not had chest tightness for a long time and cannot recall the last episode. In November she had chest pain that resolved with one nitroglycerin . She denies recent shortness of breath, edema, or palpitations. Prior swelling was worse when she was off her medications due to a refill lapse.  Her October lipid panel showed LDL 58, HDL 62, total cholesterol 866, and triglycerides 56. Hemoglobin, kidney function, and potassium were normal.  She takes Wegovy , Crestor  40 mg, lisinopril  40 mg, Antara 60 mg, Plavix  75 mg, amlodipine  10 mg, and baby aspirin . She had a recent lapse in refills but has resumed all medications. She had the flu after Christmas with some chest tightness that resolved with recovery.  She continues to work at [employer].  Reports no shortness of breath nor dyspnea on exertion. Reports no chest pain, pressure, or tightness. No edema,  orthopnea, PND. Reports no palpitations.   Discussed the use of AI scribe software for clinical note transcription with the patient, who gave verbal consent to proceed.  ROS: Pertinent ROS in HPI  Studies Reviewed: .       Echo March 10/31/21    The study is normal. The study is low risk.   No ST deviation was noted.   LV perfusion is abnormal. Defect 1: There is a small defect with mild reduction in uptake present in the apical anteroseptal location(s) that is fixed. There is normal wall motion in the  defect area. Consistent with artifact caused by breast attenuation.   Left ventricular function is normal. End diastolic cavity size is normal.   Prior study available for comparison from 12/13/2017. No changes compared to prior study.   Low risk stress nuclear study with normal perfusion and normal left ventricular regional and global systolic function.      Physical Exam:   VS:  BP 138/76   Pulse 64   Ht 5' (1.524 m)   Wt 165 lb (74.8 kg)   SpO2 98%   BMI 32.22 kg/m    Wt Readings from Last 3 Encounters:  09/21/24 165 lb (74.8 kg)  08/24/23 213 lb 9.6 oz (96.9 kg)  07/17/23 212 lb (96.2 kg)    GEN: Well nourished, well developed in no acute distress NECK: No JVD; No carotid bruits CARDIAC: RRR, no murmurs, rubs, gallops RESPIRATORY:  Clear to auscultation without rales, wheezing or rhonchi  ABDOMEN: Soft, non-tender, non-distended EXTREMITIES: 1+ pitting edema; No deformity   ASSESSMENT AND PLAN: .    Coronary artery disease without angina No recent angina or chest tightness. Last episode in November resolved with nitroglycerin . Recent flu may have caused transient symptoms, now resolved. - Ensure nitroglycerin  is available as needed.  Essential hypertension Blood pressure well-controlled. No recent dizziness. - Continue current antihypertensive regimen.  Dyslipidemia Cholesterol levels well-controlled. - Continue current lipid-lowering therapy.  Obesity On Wegovy  for weight management. Issues reported. - Continue Wegovy  for weight management.  Medication management Issues with refills due to insurance resolved. All medications refilled. - Ensured all medications are refilled and available.      Dispo: She can follow-up with Dr. Shlomo in 1 year  Signed, Orren LOISE Fabry, PA-C   "

## 2024-09-21 ENCOUNTER — Encounter: Payer: Self-pay | Admitting: Physician Assistant

## 2024-09-21 ENCOUNTER — Ambulatory Visit: Attending: Physician Assistant | Admitting: Physician Assistant

## 2024-09-21 VITALS — BP 138/76 | HR 64 | Ht 60.0 in | Wt 165.0 lb

## 2024-09-21 DIAGNOSIS — I2510 Atherosclerotic heart disease of native coronary artery without angina pectoris: Secondary | ICD-10-CM

## 2024-09-21 DIAGNOSIS — Z79899 Other long term (current) drug therapy: Secondary | ICD-10-CM

## 2024-09-21 DIAGNOSIS — R0602 Shortness of breath: Secondary | ICD-10-CM | POA: Diagnosis not present

## 2024-09-21 DIAGNOSIS — E669 Obesity, unspecified: Secondary | ICD-10-CM

## 2024-09-21 DIAGNOSIS — E785 Hyperlipidemia, unspecified: Secondary | ICD-10-CM | POA: Diagnosis not present

## 2024-09-21 DIAGNOSIS — I10 Essential (primary) hypertension: Secondary | ICD-10-CM | POA: Diagnosis not present

## 2024-09-21 MED ORDER — CLOPIDOGREL BISULFATE 75 MG PO TABS: 75.0000 mg | ORAL_TABLET | Freq: Every day | ORAL | 3 refills | Status: AC

## 2024-09-21 MED ORDER — ROSUVASTATIN CALCIUM 40 MG PO TABS: 40.0000 mg | ORAL_TABLET | Freq: Every day | ORAL | 3 refills | Status: AC

## 2024-09-21 MED ORDER — PANTOPRAZOLE SODIUM 40 MG PO TBEC: 40.0000 mg | DELAYED_RELEASE_TABLET | Freq: Every day | ORAL | 3 refills | Status: AC

## 2024-09-21 MED ORDER — AMLODIPINE BESYLATE 10 MG PO TABS: 10.0000 mg | ORAL_TABLET | Freq: Every day | ORAL | 3 refills | Status: AC

## 2024-09-21 MED ORDER — ISOSORBIDE MONONITRATE ER 60 MG PO TB24: 60.0000 mg | ORAL_TABLET | Freq: Every day | ORAL | 3 refills | Status: AC

## 2024-09-21 MED ORDER — LISINOPRIL 40 MG PO TABS: 40.0000 mg | ORAL_TABLET | Freq: Every day | ORAL | 3 refills | Status: AC

## 2024-09-21 MED ORDER — NITROGLYCERIN 0.4 MG SL SUBL: SUBLINGUAL_TABLET | SUBLINGUAL | 3 refills | Status: AC

## 2024-09-21 NOTE — Patient Instructions (Signed)
 Medication Instructions:  Your physician recommends that you continue on your current medications as directed. Please refer to the Current Medication list given to you today.  *If you need a refill on your cardiac medications before your next appointment, please call your pharmacy*  Lab Work: None ordered If you have labs (blood work) drawn today and your tests are completely normal, you will receive your results only by: MyChart Message (if you have MyChart) OR A paper copy in the mail If you have any lab test that is abnormal or we need to change your treatment, we will call you to review the results.  Testing/Procedures: None ordered  Follow-Up: At [facility], you and your health needs are our priority.  As part of our continuing mission to provide you with exceptional heart care, our providers are all part of one team.  This team includes your primary Cardiologist (physician) and Advanced Practice Providers or APPs (Physician Assistants and Nurse Practitioners) who all work together to provide you with the care you need, when you need it.  Your next appointment:   1 year(s)  Provider:   Wilbert Bihari, MD    We recommend signing up for the patient portal called MyChart.  Sign up information is provided on this After Visit Summary.  MyChart is used to connect with patients for Virtual Visits (Telemedicine).  Patients are able to view lab/test results, encounter notes, upcoming appointments, etc.  Non-urgent messages can be sent to your provider as well.   To learn more about what you can do with MyChart, go to [email or web address].   Other Instructions
